# Patient Record
Sex: Male | Born: 1965 | Race: White | Hispanic: No | Marital: Single | Smoking: Never smoker
Health system: Southern US, Community
[De-identification: ages and names within clinical notes are randomized; demographics above are authoritative.]

## PROBLEM LIST (undated history)

## (undated) DIAGNOSIS — K7460 Unspecified cirrhosis of liver: Secondary | ICD-10-CM

## (undated) DIAGNOSIS — J45909 Unspecified asthma, uncomplicated: Secondary | ICD-10-CM

## (undated) DIAGNOSIS — I10 Essential (primary) hypertension: Secondary | ICD-10-CM

## (undated) DIAGNOSIS — E7800 Pure hypercholesterolemia, unspecified: Secondary | ICD-10-CM

## (undated) DIAGNOSIS — J309 Allergic rhinitis, unspecified: Secondary | ICD-10-CM

## (undated) DIAGNOSIS — Z9981 Dependence on supplemental oxygen: Secondary | ICD-10-CM

## (undated) HISTORY — PX: SKIN CANCER EXCISION: SHX779

## (undated) HISTORY — DX: Essential (primary) hypertension: I10

## (undated) HISTORY — DX: Pure hypercholesterolemia, unspecified: E78.00

## (undated) HISTORY — PX: CARPAL TUNNEL RELEASE: SHX101

## (undated) HISTORY — DX: Unspecified cirrhosis of liver: K74.60

## (undated) HISTORY — DX: Hereditary hemochromatosis: E83.110

## (undated) HISTORY — DX: Allergic rhinitis, unspecified: J30.9

## (undated) HISTORY — DX: Unspecified asthma, uncomplicated: J45.909

---

## 2012-07-15 HISTORY — PX: CHOLECYSTECTOMY: SHX55

## 2014-07-24 ENCOUNTER — Other Ambulatory Visit: Payer: Self-pay | Admitting: Oncology

## 2014-07-24 DIAGNOSIS — K7460 Unspecified cirrhosis of liver: Secondary | ICD-10-CM

## 2014-07-24 DIAGNOSIS — R160 Hepatomegaly, not elsewhere classified: Secondary | ICD-10-CM

## 2014-08-16 ENCOUNTER — Other Ambulatory Visit: Payer: Self-pay

## 2014-11-15 ENCOUNTER — Institutional Professional Consult (permissible substitution): Payer: Self-pay | Admitting: Pulmonary Disease

## 2014-12-28 ENCOUNTER — Ambulatory Visit (INDEPENDENT_AMBULATORY_CARE_PROVIDER_SITE_OTHER): Payer: Self-pay | Admitting: Pulmonary Disease

## 2014-12-28 ENCOUNTER — Encounter: Payer: Self-pay | Admitting: Pulmonary Disease

## 2014-12-28 ENCOUNTER — Other Ambulatory Visit (INDEPENDENT_AMBULATORY_CARE_PROVIDER_SITE_OTHER): Payer: Self-pay

## 2014-12-28 VITALS — BP 136/76 | HR 71 | Ht 70.0 in | Wt 179.0 lb

## 2014-12-28 DIAGNOSIS — K7460 Unspecified cirrhosis of liver: Secondary | ICD-10-CM | POA: Insufficient documentation

## 2014-12-28 DIAGNOSIS — R0602 Shortness of breath: Secondary | ICD-10-CM

## 2014-12-28 DIAGNOSIS — J449 Chronic obstructive pulmonary disease, unspecified: Secondary | ICD-10-CM

## 2014-12-28 DIAGNOSIS — R0902 Hypoxemia: Secondary | ICD-10-CM

## 2014-12-28 DIAGNOSIS — J9611 Chronic respiratory failure with hypoxia: Secondary | ICD-10-CM | POA: Insufficient documentation

## 2014-12-28 DIAGNOSIS — K745 Biliary cirrhosis, unspecified: Secondary | ICD-10-CM

## 2014-12-28 LAB — CBC WITH DIFFERENTIAL/PLATELET
Basophils Absolute: 0 10*3/uL (ref 0.0–0.1)
Basophils Relative: 0.5 % (ref 0.0–3.0)
EOS PCT: 3.1 % (ref 0.0–5.0)
Eosinophils Absolute: 0.1 10*3/uL (ref 0.0–0.7)
HEMATOCRIT: 51.8 % (ref 39.0–52.0)
Hemoglobin: 17.8 g/dL — ABNORMAL HIGH (ref 13.0–17.0)
LYMPHS ABS: 0.9 10*3/uL (ref 0.7–4.0)
Lymphocytes Relative: 20.3 % (ref 12.0–46.0)
MCHC: 34.3 g/dL (ref 30.0–36.0)
Monocytes Absolute: 0.5 10*3/uL (ref 0.1–1.0)
Monocytes Relative: 10.9 % (ref 3.0–12.0)
Neutro Abs: 2.8 10*3/uL (ref 1.4–7.7)
Neutrophils Relative %: 65.2 % (ref 43.0–77.0)
Platelets: 61 10*3/uL — ABNORMAL LOW (ref 150.0–400.0)
RBC: 4.58 Mil/uL (ref 4.22–5.81)
RDW: 14.7 % (ref 11.5–15.5)
WBC: 4.3 10*3/uL (ref 4.0–10.5)

## 2014-12-28 NOTE — Assessment & Plan Note (Signed)
Roy Case has been told that he has asthma and COPD despite never smoking in the past or having respiratory problems as a child. He has had the fairly abrupt progression of worsening dyspnea and hypoxemic respiratory failure over the last 3 years. He says that he is not sure that he actually has COPD and I agree with him. He does note some symptomatic improvement with albuterol, but I believe that his degree of hypoxemia outweighs any abnormality seen on his CT chest from 2015 or on his exam today. So it's not entirely clear to me that he actually has COPD. I worry that it may be that his hypoxemia is actually from intrapulmonary shunting related to his cirrhosis or an intracardiac shunt.  Plan: Continue to treat as COPD for now Check alpha-1 antitrypsin phenotype considering the fact that he has cirrhosis Continue oxygen as prescribed Continue Breo Continue albuterol as needed [facility]: Pulmonary function testing, pulmonary results, discharge summary, echocardiogram Follow-up 4-6 weeks

## 2014-12-28 NOTE — Patient Instructions (Signed)
We will request records from [facility] to see the results of your lung function testing and your echocardiograms Keep using your oxygen as you're doing We will call you with the results of the bloodwork We will see you back in 4-6 weeks or sooner if needed

## 2014-12-28 NOTE — Assessment & Plan Note (Signed)
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 Apparently he had a biopsy to confirm this. He does not have a gastroenterologist.  Plan: Refer to Dr. Erick BlinksJay Pyrtle for evaluation of his cirrhosis

## 2014-12-28 NOTE — Progress Notes (Signed)
Subjective:    Patient ID: Roy Case, male    DOB: Feb 27, 1966, 49 y.o.   MRN: [id number]  HPI Chief Complaint  Patient presents with  . PULMONARY CONSULT    self referral. pt. on o2 3.5-4L. occ. SOB. no wheezing. occ. dry cough. no chest pain/tightness.    This is a pleasant 49 year old male who has hypoxemic respiratory failure of uncertain etiology who comes to my clinic today for establishing care for the same. He says that he never smoked, he had a normal childhood without respiratory illnesses and throughout most of his adult life he never had any respiratory problems. However, 3 years ago he started having increasing shortness of breath when climbing a flight of stairs. He said this is a bit unusual because previously he had been very active with running and taking long walks. His symptoms progressed and so he was started on oxygen when he was hospitalized for "COPD" in 2014 in [place]. After that he says that he has been experiencing increasing shortness of breath and increasing oxygen requirements. He has undergone pulmonary function testing and was told that he had COPD versus asthma. He has been treated with multiple bronchodilators which he says helped to some degree. Albuterol does help when he takes it. He says that shortness of breath occurs primarily on exertion but he says that he also (significant orthopnea. He says that chemicals, dust, smoke, and fumes will sometimes make his breathing worse. He never smoked cigarettes. 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 He said that he was exposed to battery acid at times and he says that there were times when lithium batteries were being destroyed and he would be exposed to some of the chemicals and fumes that came off from them. He previously saw [place] in [place] but he was unsatisfied so he came to us for a second opinion.   Past Medical History  Diagnosis Date  . Hypertension   .  Allergic rhinitis   . Asthma   . High cholesterol      Family History  Problem Relation Age of Onset  . Lung cancer Mother      Social History   Social History  . Marital Status: Unknown    Spouse Name: N/A  . Number of Children: N/A  . Years of Education: N/A   Occupational History  . Not on file.   Social History Main Topics  . Smoking status: Never Smoker   . Smokeless tobacco: Never Used  . Alcohol Use: No  . Drug Use: No  . Sexual Activity: Not Currently   Other Topics Concern  . Not on file   Social History Narrative  . No narrative on file     Allergies  Allergen Reactions  . Penicillins Anaphylaxis    Age 35  . Doxycycline Rash     No outpatient prescriptions prior to visit.   No facility-administered medications prior to visit.       Review of Systems  HENT: Positive for sinus pressure and voice change.   Eyes: Negative.   Respiratory: Positive for cough, shortness of breath and wheezing.   Cardiovascular: Positive for palpitations and leg swelling.  Gastrointestinal: Negative.   Genitourinary: Negative.   Musculoskeletal: Negative.   Skin: Negative.   Allergic/Immunologic: Negative.   Neurological: Positive for dizziness, weakness and headaches.  Hematological: Bruises/bleeds easily.  Psychiatric/Behavioral: Negative.        Objective:   Physical Exam  Filed Vitals:   12/28/14 1515  BP: 136/76  Pulse: 71  Height:  (1.778 m)  Weight: 179 lb (81.194 kg)  SpO2: 83%   RA; 92% 4L [place]  Gen: chronically ill appearing, no acute distress HENT: NCAT, OP clear, neck supple without masses Eyes: PERRL, EOMi Lymph: no cervical lymphadenopathy PULM: CTA B CV: RRR, no mgr, no JVD GI: BS+, soft, nontender, no hsm Derm: palmar erythema noted, no rash or skin breakdown MSK: normal bulk and tone Neuro: A&Ox4, CN II-XII intact, strength 5/5 in all 4 extremities Psyche: normal mood and affect  2015 CT chest images personally reviewed  showing no pulmonary parenchymal abnormality that I can see      Assessment & Plan:  COPD with hypoxia [facility]) Roy Case has been told that he has asthma and COPD despite never smoking in the past or having respiratory problems as a child. He has had the fairly abrupt progression of worsening dyspnea and hypoxemic respiratory failure over the last 3 years. He says that he is not sure that he actually has COPD and I agree with him. He does note some symptomatic improvement with albuterol, but I believe that his degree of hypoxemia outweighs any abnormality seen on his CT chest from 2015 or on his exam today. So it's not entirely clear to me that he actually has COPD. I worry that it may be that his hypoxemia is actually from intrapulmonary shunting related to his cirrhosis or an intracardiac shunt.  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 Apparently he had a biopsy to confirm this. He does not have a gastroenterologist.  Plan: Refer to Dr. Erick Case for evaluation of his cirrhosis     Current outpatient prescriptions:  .  albuterol (PROVENTIL HFA;VENTOLIN HFA) 108 (90 BASE) MCG/ACT inhaler, Inhale 1 puff into the lungs every 6 (six) hours as needed for wheezing or shortness of breath., Disp: , Rfl:  .  ALPRAZolam (XANAX) 0.5 MG tablet, Take 0.5 mg by mouth at bedtime as needed for anxiety., Disp: , Rfl:  .  Fluticasone Furoate-Vilanterol (BREO ELLIPTA) 100-25 MCG/INH AEPB, Inhale into the lungs., Disp: , Rfl:  .  propranolol (INDERAL) 40 MG tablet, Take 40 mg by mouth 2 (two) times  daily., Disp: , Rfl:  .  sertraline (ZOLOFT) 25 MG tablet, Take 25 mg by mouth daily., Disp: , Rfl:

## 2015-01-02 LAB — ALPHA-1 ANTITRYPSIN PHENOTYPE: A-1 Antitrypsin: 149 mg/dL (ref 83–199)

## 2015-01-25 ENCOUNTER — Encounter: Payer: Self-pay | Admitting: Pulmonary Disease

## 2015-01-25 ENCOUNTER — Ambulatory Visit (INDEPENDENT_AMBULATORY_CARE_PROVIDER_SITE_OTHER): Payer: Self-pay | Admitting: Pulmonary Disease

## 2015-01-25 VITALS — BP 128/76 | HR 68 | Ht 70.0 in | Wt 177.0 lb

## 2015-01-25 DIAGNOSIS — R0902 Hypoxemia: Secondary | ICD-10-CM

## 2015-01-25 DIAGNOSIS — J019 Acute sinusitis, unspecified: Secondary | ICD-10-CM | POA: Insufficient documentation

## 2015-01-25 DIAGNOSIS — J449 Chronic obstructive pulmonary disease, unspecified: Secondary | ICD-10-CM

## 2015-01-25 DIAGNOSIS — J0120 Acute ethmoidal sinusitis, unspecified: Secondary | ICD-10-CM

## 2015-01-25 DIAGNOSIS — K745 Biliary cirrhosis, unspecified: Secondary | ICD-10-CM

## 2015-01-25 MED ORDER — AZITHROMYCIN 250 MG PO TABS: ORAL_TABLET | ORAL | Status: DC

## 2015-01-25 NOTE — Addendum Note (Signed)
Addended by: Nicanor AlconNAGLE, Rya Rausch K on: 01/25/2015 12:14 PM   Modules accepted: Orders

## 2015-01-25 NOTE — Progress Notes (Signed)
Subjective:    Patient ID: Roy Case, male    DOB: 1965-11-03, 49 y.o.   MRN: [id number]   Synopsis: Roy Case was referred for hypoxemic respiratory failure in 2016. He has been labeled as having COPD despite having a normal lung exam and never smoking cigarettes. As of November 2016 records we have requested from his primary care physician and his pulmonologist is still not been sent. Apparently he has had an extensive workup with PFTs and lung imaging in the past.  HPI  Chief Complaint  Patient presents with  . Follow-up    c/o sinus congestion, PND, yellow mucus X1 week.  denies any chest pain.     Roy Case has been struggling with sinus problems lately.  He says that he has a lot of pressure behind his eyes.  He has lots of green to yellow sinus congestion, coughing up more mucus.  Green to yellow sputum production. Breathing has been about the same during this time.  He has not seen Dr. Rhea BeltonPyrtle yet, plans to see him in January 2017.   Past Medical History  Diagnosis Date  . Hypertension   . Allergic rhinitis   . Asthma   . High cholesterol        Review of Systems     Objective:   Physical Exam Filed Vitals:   01/25/15 1134  BP: 128/76  Pulse: 68  Height: 5\' 10"  (1.778 m)  Weight: 177 lb (80.287 kg)  SpO2: 90%   4L [place]  Gen: chronically ill appearing HENT: OP clear, TM's clear, neck supple PULM: CTA B, normal percussion CV: RRR, no mgr, trace edema GI: BS+, soft, nontender Derm: mild cyanosis, no rash Psyche: normal mood and affect        Assessment & Plan:  COPD with hypoxia (HCC) This has been a stable interval but I still question the diagnosis of COPD. Unfortunately despite requesting records from his prior pulmonologist we still don't have them at this time. I still question a diagnosis of COPD considering the fact that he never smoked cigarettes. I do worry that there is some sort of interpulmonary intraparenchymal shunting. I'm  reluctant to order more testing because of his insurance status right now.  His recent alpha-1 testing was normal.  Plan: Continue oxygen as prescribed Continue Breo for now Follow-up in 2 months after we have received records from [place]  Hepatic cirrhosis [facility](HCC) As stated previously he has a history of hepatic cirrhosis which apparently has been biopsied. Again, I don't have any records of this. I would like for him to establish care with Dr. Rhea BeltonPyrtle after he has insurance.  Acute sinus infection He has had sinus pressure and pain for the last 2 weeks with purulent mucus production. Plan: Z-Pak     Current outpatient prescriptions:  .  albuterol (PROVENTIL HFA;VENTOLIN HFA) 108 (90 BASE) MCG/ACT inhaler, Inhale 1 puff into the lungs every 6 (six) hours as needed for wheezing or shortness of breath., Disp: , Rfl:  .  albuterol (PROVENTIL) (2.5 MG/3ML) 0.083% nebulizer solution, Take 2.5 mg by nebulization every 6 (six) hours as needed for wheezing or shortness of breath., Disp: , Rfl:  .  ALPRAZolam (XANAX) 0.5 MG tablet, Take 0.5 mg by mouth at bedtime as needed for anxiety., Disp: , Rfl:  .  Fluticasone Furoate-Vilanterol (BREO ELLIPTA) 100-25 MCG/INH AEPB, Inhale into the lungs., Disp: , Rfl:  .  propranolol (INDERAL) 40 MG tablet, Take 40 mg by mouth 2 (two)  times daily., Disp: , Rfl:  .  sertraline (ZOLOFT) 25 MG tablet, Take 25 mg by mouth daily., Disp: , Rfl:

## 2015-01-25 NOTE — Assessment & Plan Note (Signed)
He has had sinus pressure and pain for the last 2 weeks with purulent mucus production. Plan: Z-Pak

## 2015-01-25 NOTE — Assessment & Plan Note (Signed)
This has been a stable interval but I still question the diagnosis of COPD. Unfortunately despite requesting records from his prior pulmonologist we still don't have them at this time. I still question a diagnosis of COPD considering the fact that he never smoked cigarettes. I do worry that there is some sort of interpulmonary intraparenchymal shunting. I'm reluctant to order more testing because of his insurance status right now.  His recent alpha-1 testing was normal.  Plan: Continue oxygen as prescribed Continue Breo for now Follow-up in 2 months after we have received records from [place]

## 2015-01-25 NOTE — Assessment & Plan Note (Signed)
As stated previously he has a history of hepatic cirrhosis which apparently has been biopsied. Again, I don't have any records of this. I would like for him to establish care with Dr. Rhea BeltonPyrtle after he has insurance.

## 2015-01-29 ENCOUNTER — Encounter: Payer: Self-pay | Admitting: Internal Medicine

## 2015-02-02 ENCOUNTER — Telehealth: Payer: Self-pay | Admitting: Pulmonary Disease

## 2015-02-02 NOTE — Telephone Encounter (Signed)
Patient was seen in ER last night.  He is feeling a little better today, he was given Tylenol with Codeine, steroid shot.   [facility]. Patient was seen by Dr. Kendrick FriesMcQuaid on 11/10, pt says the hospital asked him to call our office to make Dr. Kendrick FriesMcQuaid aware. Patient wants to know if Dr. Kendrick FriesMcQuaid needs to see him again.  Dr. Kendrick FriesMcQuaid, please advise.

## 2015-02-05 NOTE — Telephone Encounter (Signed)
Make sure he has an appointment within the next month please

## 2015-02-05 NOTE — Telephone Encounter (Signed)
Spoke with pt, scheduled with TP for 11/30.  Nothing further needed at this time.  Forwarding to BQ just as FYI.

## 2015-02-14 ENCOUNTER — Ambulatory Visit: Payer: Self-pay | Admitting: Adult Health

## 2015-02-23 ENCOUNTER — Telehealth: Payer: Self-pay | Admitting: Pulmonary Disease

## 2015-02-23 MED ORDER — ALBUTEROL SULFATE HFA 108 (90 BASE) MCG/ACT IN AERS: 1.0000 | INHALATION_SPRAY | RESPIRATORY_TRACT | Status: DC | PRN

## 2015-02-23 NOTE — Telephone Encounter (Signed)
Pt requesting refill on proair.  This has been sent.  Nothing further needed.

## 2015-04-02 ENCOUNTER — Encounter: Payer: Self-pay | Admitting: Pulmonary Disease

## 2015-04-02 ENCOUNTER — Ambulatory Visit (INDEPENDENT_AMBULATORY_CARE_PROVIDER_SITE_OTHER): Payer: BLUE CROSS/BLUE SHIELD | Admitting: Pulmonary Disease

## 2015-04-02 VITALS — BP 124/72 | HR 62 | Ht 70.0 in | Wt 175.0 lb

## 2015-04-02 DIAGNOSIS — K745 Biliary cirrhosis, unspecified: Secondary | ICD-10-CM | POA: Diagnosis not present

## 2015-04-02 DIAGNOSIS — J9611 Chronic respiratory failure with hypoxia: Secondary | ICD-10-CM

## 2015-04-02 NOTE — Assessment & Plan Note (Addendum)
Mr. Roy Case has unexplained chronic hypoxemic respiratory failure. He has previously been labeled as having COPD but this is incorrect because the simple spirometry results available to me showed no evidence of airflow obstruction. I have personally reviewed the images from his July 2015 CT chest and I really cannot appreciate much pulmonary parenchymal disease, though the study was limited by motion artifact. There was no evidence of a pulmonary embolism. He does have a chronic cough with some mucus production so at best he has chronic bronchitis but I'm not sure that it severe enough to explain his hypoxemia.  The only fact of which I'm certain is that he has cirrhosis, so this raises the question for right to left shunting. His echocardiogram in February 2016 did not show evidence of pulmonary hypertension, but sometimes these can be falsely negative.  So at this point the differential diagnosis of his chronic respiratory failure with hypoxemia for me is chronic right to left shunting secondary to cirrhosis versus portal pulmonary hypertension versus much less likely chronic bronchitis which did not manifest itself as airflow obstruction in his most recent simple spirometry testing.  Plan: Continue Breo for now Apply for portable oxygen concentrator See GI (Dr. Rhea BeltonPyrtle) for cirrhosis workup Full pulmonary function testing to evaluate for restrictive lung disease and obtained DLCO Depending on results of pulmonary function testing will either order an echocardiogram with shunt (bubble) study or a high resolution CT chest, less likely right heart catheterization Follow-up 6-8 weeks

## 2015-04-02 NOTE — Assessment & Plan Note (Signed)
He has an appointment with Dr. Rhea BeltonPyrtle this week to evaluate his cirrhosis. As above, I am concerned that there may be some right to left shunting which can be seen with cirrhosis. Of note, there was a hypodense lesion seen in the left hepatic lobe on his July 2015 CT chest. Because he has apparently had a biopsy of his liver I'm going to request those results of that they are available to Dr. Rhea BeltonPyrtle at the time of his consultation this week.

## 2015-04-02 NOTE — Progress Notes (Signed)
Subjective:    Patient ID: Roy Case, male    DOB: Jul 28, 1965, 50 y.o.   MRN: [id number]   Synopsis: Roy Case was referred for hypoxemic respiratory failure in 2016. He has been labeled as having COPD despite having a normal lung exam and never smoking cigarettes.   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 Nodular, cirrhotic liver with a 1.5 cm mildly hyperdense mass in the left hepatic lobe. July 2015 simple spirometry: Ratio 74%, FEV1 2.53 L (60% predicted), FVC 3.43 L (64% predicted) February 2016 echocardiogram: LVEF 65-70%, right ventricle is normal in size and function, right atrial normal in size and function. Mild TR, normal PA pressure estimate  HPI  Chief Complaint  Patient presents with  . Follow-up    Pt has good and bad days, about the same number of each; bad days include nonprod cough, increased wheezing, SOB, chest tightness.    Roy Case is lined up to see Dr. Rhea BeltonPyrtle later this week to evaluate his cirrhosis. He tells me that this is been a stable interval for him. There is been no significant change in his shortness of breath over the last several weeks. [facility] As above, he continues to have some wheezing, cough and chest tightness. He's had no significant flareups in the last several weeks. He continues to use and benefit from his oxygen on a regular basis.  Past Medical History  Diagnosis Date  . Hypertension   . Allergic rhinitis   . Asthma   . High cholesterol        Review of Systems  Constitutional: Negative for fever, chills and fatigue.  HENT: Negative for postnasal drip, sinus pressure and sneezing.   Respiratory: Positive for cough, shortness of breath and wheezing.   Cardiovascular: Positive for leg swelling. Negative for chest pain and palpitations.      Objective:   Physical Exam Filed Vitals:   04/02/15 0907  BP: 124/72  Pulse: 62  Height: 5\' 10"  (1.778 m)  Weight: 79.379 kg (175 lb)  SpO2: 89%   4L [place]  Gen: chronically ill appearing HENT: OP clear, TM's clear, neck supple PULM: CTA B, normal percussion CV: RRR, no mgr, trace ankle edema GI: BS+, soft, nontender Derm: mild diffuse cyanosis, no rash Psyche: normal mood and affect  Multiple studies reviewed today, summarized above; I personally reviewed the images from his CT chest from 09/2013     Assessment & Plan:  Chronic respiratory failure with hypoxia [facility](HCC) Roy Case has unexplained chronic hypoxemic respiratory failure. He has previously been labeled as having COPD but this is incorrect because the simple spirometry results available to me showed no evidence of airflow obstruction. I have personally reviewed the images from his July 2015 CT chest and I really cannot appreciate much pulmonary parenchymal disease, though the study was limited by motion artifact. There was no evidence of a pulmonary embolism. He does have a chronic cough with some mucus production so at best he has chronic bronchitis but I'm not sure that it severe enough to explain his hypoxemia.  The only fact of which I'm certain is that he has cirrhosis, so this raises the question for right to left shunting. His echocardiogram in February 2016 did not show evidence of pulmonary hypertension, but sometimes these can be falsely negative.  So at this point the differential  diagnosis of his chronic respiratory failure with hypoxemia for me is chronic right to left shunting secondary to cirrhosis versus portal pulmonary hypertension versus much less likely chronic bronchitis which did not manifest itself as airflow obstruction in his most recent simple spirometry testing.  Plan: Continue Breo for now Apply for portable oxygen concentrator See GI (Dr. Rhea Belton) for cirrhosis workup Full pulmonary function  testing to evaluate for restrictive lung disease and obtained DLCO Depending on results of pulmonary function testing will either order an echocardiogram with shunt (bubble) study or a high resolution CT chest, less likely right heart catheterization Follow-up 6-8 weeks  Hepatic cirrhosis (HCC) He has an appointment with Dr. Rhea Belton this week to evaluate his cirrhosis. As above, I am concerned that there may be some right to left shunting which can be seen with cirrhosis. Of note, there was a hypodense lesion seen in the left hepatic lobe on his July 2015 CT chest. Because he has apparently had a biopsy of his liver I'm going to request those results of that they are available to Dr. Rhea Belton at the time of his consultation this week.  > 50% of time spent face to face in a 32 minute visit   Current outpatient prescriptions:  .  albuterol (PROVENTIL HFA;VENTOLIN HFA) 108 (90 BASE) MCG/ACT inhaler, Inhale 1 puff into the lungs every 4 (four) hours as needed for wheezing or shortness of breath., Disp: 18 g, Rfl: 2 .  albuterol (PROVENTIL) (2.5 MG/3ML) 0.083% nebulizer solution, Take 2.5 mg by nebulization every 6 (six) hours as needed for wheezing or shortness of breath., Disp: , Rfl:  .  ALPRAZolam (XANAX) 0.5 MG tablet, Take 0.5 mg by mouth at bedtime as needed for anxiety., Disp: , Rfl:  .  Fluticasone Furoate-Vilanterol (BREO ELLIPTA) 100-25 MCG/INH AEPB, Inhale into the lungs., Disp: , Rfl:  .  propranolol (INDERAL) 40 MG tablet, Take 40 mg by mouth 2 (two) times daily., Disp: , Rfl:  .  sertraline (ZOLOFT) 25 MG tablet, Take 25 mg by mouth daily., Disp: , Rfl:

## 2015-04-02 NOTE — Patient Instructions (Signed)
We are going to order a full PFT on Wednesday of this week. Once that result is back we will let you know the next steps for getting to the bottom of you illness. We will see you back in 6-8 weeks.

## 2015-04-04 ENCOUNTER — Other Ambulatory Visit (INDEPENDENT_AMBULATORY_CARE_PROVIDER_SITE_OTHER): Payer: BLUE CROSS/BLUE SHIELD

## 2015-04-04 ENCOUNTER — Encounter: Payer: Self-pay | Admitting: Internal Medicine

## 2015-04-04 ENCOUNTER — Ambulatory Visit (INDEPENDENT_AMBULATORY_CARE_PROVIDER_SITE_OTHER): Payer: BLUE CROSS/BLUE SHIELD | Admitting: Internal Medicine

## 2015-04-04 ENCOUNTER — Ambulatory Visit (HOSPITAL_COMMUNITY)
Admission: RE | Admit: 2015-04-04 | Discharge: 2015-04-04 | Disposition: A | Discharge status: Home / Self Care | Payer: BLUE CROSS/BLUE SHIELD | Source: Ambulatory Visit | Attending: Pulmonary Disease | Admitting: Pulmonary Disease

## 2015-04-04 VITALS — BP 128/72 | HR 76 | Ht 70.0 in | Wt 173.6 lb

## 2015-04-04 DIAGNOSIS — J9611 Chronic respiratory failure with hypoxia: Secondary | ICD-10-CM | POA: Insufficient documentation

## 2015-04-04 DIAGNOSIS — Z0189 Encounter for other specified special examinations: Secondary | ICD-10-CM | POA: Diagnosis not present

## 2015-04-04 DIAGNOSIS — K7460 Unspecified cirrhosis of liver: Secondary | ICD-10-CM

## 2015-04-04 DIAGNOSIS — D696 Thrombocytopenia, unspecified: Secondary | ICD-10-CM | POA: Diagnosis not present

## 2015-04-04 DIAGNOSIS — K766 Portal hypertension: Secondary | ICD-10-CM | POA: Diagnosis not present

## 2015-04-04 DIAGNOSIS — D7589 Other specified diseases of blood and blood-forming organs: Secondary | ICD-10-CM | POA: Diagnosis not present

## 2015-04-04 LAB — PULMONARY FUNCTION TEST
DL/VA % PRED: 38 %
DL/VA: 1.77 ml/min/mmHg/L
DLCO unc % pred: 30 %
DLCO unc: 9.4 ml/min/mmHg
FEF 25-75 POST: 2.64 L/s
FEF 25-75 Pre: 1.67 L/sec
FEF2575-%CHANGE-POST: 58 %
FEF2575-%PRED-POST: 76 %
FEF2575-%PRED-PRE: 48 %
FEV1-%Change-Post: 14 %
FEV1-%Pred-Post: 74 %
FEV1-%Pred-Pre: 65 %
FEV1-PRE: 2.5 L
FEV1-Post: 2.87 L
FEV1FVC-%CHANGE-POST: 11 %
FEV1FVC-%PRED-PRE: 88 %
FEV6-%Change-Post: 3 %
FEV6-%PRED-PRE: 75 %
FEV6-%Pred-Post: 78 %
FEV6-Post: 3.73 L
FEV6-Pre: 3.61 L
FEV6FVC-%Change-Post: 0 %
FEV6FVC-%Pred-Post: 103 %
FEV6FVC-%Pred-Pre: 102 %
FVC-%CHANGE-POST: 2 %
FVC-%PRED-PRE: 73 %
FVC-%Pred-Post: 75 %
FVC-POST: 3.73 L
FVC-PRE: 3.63 L
POST FEV1/FVC RATIO: 77 %
POST FEV6/FVC RATIO: 100 %
PRE FEV1/FVC RATIO: 69 %
Pre FEV6/FVC Ratio: 100 %
RV % pred: 108 %
RV: 2.12 L
TLC % pred: 84 %
TLC: 5.74 L

## 2015-04-04 LAB — COMPREHENSIVE METABOLIC PANEL
ALT: 36 U/L (ref 0–53)
AST: 57 U/L — ABNORMAL HIGH (ref 0–37)
Albumin: 3 g/dL — ABNORMAL LOW (ref 3.5–5.2)
Alkaline Phosphatase: 83 U/L (ref 39–117)
BILIRUBIN TOTAL: 2.7 mg/dL — AB (ref 0.2–1.2)
BUN: 9 mg/dL (ref 6–23)
CALCIUM: 8.5 mg/dL (ref 8.4–10.5)
CO2: 23 meq/L (ref 19–32)
Chloride: 110 mEq/L (ref 96–112)
Creatinine, Ser: 0.65 mg/dL (ref 0.40–1.50)
GFR: 139 mL/min (ref >60.00)
Glucose, Bld: 76 mg/dL (ref 70–99)
Potassium: 3.8 mEq/L (ref 3.5–5.1)
Sodium: 141 mEq/L (ref 135–145)
Total Protein: 6.6 g/dL (ref 6.0–8.3)

## 2015-04-04 LAB — IBC PANEL
IRON: 189 ug/dL — AB (ref 42–165)
Saturation Ratios: 87.7 % — ABNORMAL HIGH (ref 20.0–50.0)
Transferrin: 154 mg/dL — ABNORMAL LOW (ref 212.0–360.0)

## 2015-04-04 LAB — CBC WITH DIFFERENTIAL/PLATELET
BASOS PCT: 0.4 % (ref 0.0–3.0)
Basophils Absolute: 0 10*3/uL (ref 0.0–0.1)
EOS PCT: 4.8 % (ref 0.0–5.0)
Eosinophils Absolute: 0.2 10*3/uL (ref 0.0–0.7)
HEMATOCRIT: 51.4 % (ref 39.0–52.0)
HEMOGLOBIN: 17.6 g/dL — AB (ref 13.0–17.0)
Lymphocytes Relative: 19.4 % (ref 12.0–46.0)
Lymphs Abs: 0.8 10*3/uL (ref 0.7–4.0)
MCHC: 34.2 g/dL (ref 30.0–36.0)
MCV: 111.5 fl — ABNORMAL HIGH (ref 78.0–100.0)
MONO ABS: 0.5 10*3/uL (ref 0.1–1.0)
MONOS PCT: 13 % — AB (ref 3.0–12.0)
NEUTROS PCT: 62.4 % (ref 43.0–77.0)
Neutro Abs: 2.6 10*3/uL (ref 1.4–7.7)
Platelets: 67 10*3/uL — ABNORMAL LOW (ref 150.0–400.0)
RBC: 4.61 Mil/uL (ref 4.22–5.81)
RDW: 15 % (ref 11.5–15.5)
WBC: 4.1 10*3/uL (ref 4.0–10.5)

## 2015-04-04 LAB — FOLATE: Folate: >23.6 ng/mL (ref >5.9)

## 2015-04-04 LAB — PROTIME-INR
INR: 1.8 ratio — ABNORMAL HIGH (ref 0.8–1.0)
PROTHROMBIN TIME: 18.9 s — AB (ref 9.6–13.1)

## 2015-04-04 LAB — FERRITIN: Ferritin: 493.1 ng/mL — ABNORMAL HIGH (ref 22.0–322.0)

## 2015-04-04 LAB — VITAMIN B12: Vitamin B-12: 663 pg/mL (ref 211–911)

## 2015-04-04 MED ORDER — ALBUTEROL SULFATE (2.5 MG/3ML) 0.083% IN NEBU
2.5000 mg | INHALATION_SOLUTION | Freq: Once | RESPIRATORY_TRACT | Status: AC
  Administered 2015-04-04: 2.5 mg via RESPIRATORY_TRACT

## 2015-04-04 NOTE — Patient Instructions (Addendum)
You have been scheduled for an endoscopy. Please follow written instructions given to you at your visit today. If you use inhalers (even only as needed), please bring them with you on the day of your procedure. Your physician has requested that you go to www.startemmi.com and enter the access code given to you at your visit today. This web site gives a general overview about your procedure. However, you should still follow specific instructions given to you by our office regarding your preparation for the procedure.  Your physician has requested that you go to the basement for the following lab work before leaving today: CBC, CMP, INR, hemochromatosis gene analysis, IBC, Ferritin  You have been scheduled to see Terri Piedra, MD at [place] on 05/03/15 @ 9:00 am. Please arrive at 8:45 am for registration. Bring medications, insurance cards and copay if you have one.  You have been scheduled for a CT scan of the abdomen and pelvis at [place] ([address]---this is in the same building as Press [facility]).   You are scheduled on Friday at 8:30 am. You should arrive 15 minutes prior to your appointment time for registration. Please follow the written instructions below on the day of your exam:  WARNING: IF YOU ARE ALLERGIC TO IODINE/X-RAY DYE, PLEASE NOTIFY RADIOLOGY IMMEDIATELY AT [phone]! YOU WILL BE GIVEN A 13 HOUR PREMEDICATION PREP.  1) Do not eat or drink anything after 4:30 am (4 hours prior to your test) 2) You have been given 1 bottle of oral contrast to drink. The solution may taste better if refrigerated, but do NOT add ice or any other liquid to this solution. Shake well before drinking.    Drink 1 bottle of contrast @ 7:30 am (1 hour prior to your exam)  You may take any medications as prescribed with a small amount of water except for the following: Metformin, Glucophage, Glucovance, Avandamet, Riomet, Fortamet, Actoplus Met, Janumet, Glumetza  or Metaglip. The above medications must be held the day of the exam AND 48 hours after the exam.  The purpose of you drinking the oral contrast is to aid in the visualization of your intestinal tract. The contrast solution may cause some diarrhea. Before your exam is started, you will be given a small amount of fluid to drink. Depending on your individual set of symptoms, you may also receive an intravenous injection of x-ray contrast/dye. Plan on being at [facility] for 30 minutes or longer, depending on the type of exam you are having performed.  This test typically takes 30-45 minutes to complete.  If you have any questions regarding your exam or if you need to reschedule, you may call the CT department at [phone] between the hours of 8:00 am and 5:00 pm, Monday-Friday.  ________________________________________________________________________

## 2015-04-04 NOTE — Progress Notes (Signed)
Patient ID: Roy Case, male   DOB: March 24, 1965, 50 y.o.   MRN: [id number] HPI: Roy Case is a 50 year old male with past medical history of cryptogenic cirrhosis, oxygen-dependent lung disease of unclear etiology, hypertension and hyperlipidemia who seen in consultation at the request of Dr. Kendrick Case and Dr. Michel Santee Case to establish care given history of cirrhosis. He is here alone today. He reports he was diagnosed with cirrhosis at the time of cholecystectomy for cholecystitis. Liver was reportedly biopsied at this time and cirrhosis was confirmed. He reports he has a history of obesity and at the time of gallbladder surgery was 235 pounds but he has lost 50-60 pounds due to lifestyle modification since surgery. He denies a history of jaundice, GI bleeding, confusion, ascites. He has very mild intermittent edema at the ankle on occasion. He denies abdominal pain. Denies nausea or vomiting. Reports good appetite without dysphagia or odynophagia. Bowel movements are regular without blood in his stool or melena. He denies a family history of liver disease or colorectal cancer. He does not currently drink alcohol and denies alcohol abuse history.  He was seen by Dr. Jennye Case after diagnosis but does not recall prior upper endoscopy. Per these records initial workup was negative for viral hepatitis and he received hepatitis A and B vaccination. Negative alpha-1 genetic evaluation, negative ANA, negative anti-smooth muscle antibody, normal ceruloplasmin, normal ferritin but iron saturation was elevated at 75%. Serum iron was normal. He's had prior CT of the abdomen in April 2014 which showed hepatic cirrhosis with splenomegaly, liver cyst in the right lobe  Review of records also reveals history of thrombocytopenia  He uses propranolol for "blood pressure".  Past Medical History  Diagnosis Date  . Hypertension   . Allergic rhinitis   . Asthma   . High cholesterol   . Cirrhosis [facility])      Past Surgical History  Procedure Laterality Date  . Cholecystectomy  may 2014  . Skin cancer excision Left     arm  . Carpal tunnel release Right     Outpatient Prescriptions Prior to Visit  Medication Sig Dispense Refill  . albuterol (PROVENTIL HFA;VENTOLIN HFA) 108 (90 BASE) MCG/ACT inhaler Inhale 1 puff into the lungs every 4 (four) hours as needed for wheezing or shortness of breath. 18 g 2  . albuterol (PROVENTIL) (2.5 MG/3ML) 0.083% nebulizer solution Take 2.5 mg by nebulization every 6 (six) hours as needed for wheezing or shortness of breath.    . ALPRAZolam (XANAX) 0.5 MG tablet Take 0.5 mg by mouth at bedtime as needed for anxiety.    . Fluticasone Furoate-Vilanterol (BREO ELLIPTA) 100-25 MCG/INH AEPB Inhale into the lungs.    . propranolol (INDERAL) 40 MG tablet Take 40 mg by mouth 2 (two) times daily.    . sertraline (ZOLOFT) 25 MG tablet Take 25 mg by mouth daily.     No facility-administered medications prior to visit.    Allergies  Allergen Reactions  . Penicillins Anaphylaxis    Age 50  . Doxycycline Rash    Family History  Problem Relation Age of Onset  . Lung cancer Mother   . Colon cancer Neg Hx   . Esophageal cancer Neg Hx   . Pancreatic cancer Neg Hx   . Kidney disease Neg Hx   . Liver disease Neg Hx     Social History  Substance Use Topics  . Smoking status: Never Smoker   . Smokeless tobacco: Never Used  . Alcohol Use: No  ROS: As per history of present illness, otherwise negative  BP 128/72 mmHg  Pulse 76  Ht  (1.778 m)  Wt 173 lb 9.6 oz (78.744 kg)  BMI 24.91 kg/m2 Constitutional: Well-developed and well-nourished. No distress. HEENT: Normocephalic and atraumatic. Oropharynx is clear and moist. No oropharyngeal exudate. Conjunctivae are normal.  No scleral icterus. Poor dentition Neck: Neck supple. Trachea midline. Cardiovascular: Normal rate, regular rhythm and intact distal pulses. No M/R/G Pulmonary/chest: Effort normal  and breath sounds normal. No wheezing, rales or rhonchi. Abdominal: Soft, nontender, nondistended. Bowel sounds active throughout. Spleen is palpable below the left costal margin Extremities: no clubbing, cyanosis, or edema Lymphadenopathy: No cervical adenopathy noted. Neurological: Alert and oriented to person place and time. No asterixis Skin: Skin is warm and dry. No rashes noted. No palmar erythema or spider angioma seen Psychiatric: Normal mood and affect. Behavior is normal.  RELEVANT LABS AND IMAGING: CBC    Component Value Date/Time   WBC 4.3 12/28/2014 1618   RBC 4.58 12/28/2014 1618   HGB 17.8* 12/28/2014 1618   HCT 51.8 12/28/2014 1618   PLT 61.0 Repeated and verified X2.* 12/28/2014 1618   MCV 113.1 Repeated and verified X2.* 12/28/2014 1618   MCHC 34.3 12/28/2014 1618   RDW 14.7 12/28/2014 1618   LYMPHSABS 0.9 12/28/2014 1618   MONOABS 0.5 12/28/2014 1618   EOSABS 0.1 12/28/2014 1618   BASOSABS 0.0 12/28/2014 1618   Labs from April 2016 reviewed -- INR 1.4 AST 71, ALT 55, alkaline phosphatase 81, total bili 2.4  ASSESSMENT/PLAN: 50 year old male with past medical history of cryptogenic cirrhosis, oxygen-dependent lung disease of unclear etiology, hypertension and hyperlipidemia who seen in consultation at the request of Dr. Kendrick Case and Dr. Michel Santee Case to establish care given history of cirrhosis.  1. Cryptogenic cirrhosis with portal hypertension -- cirrhosis etiology unclear but most likely nonalcoholic fatty liver disease. Would like to obtain biopsy and all records from [facility] if possible. We had a long discussion today regarding cirrhosis, portal hypertension and the complications and management going forward. I'm concerned that he has significant portal hypertension given thrombocytopenia and suggestion of varices by cross-sectional imaging. His INR has also been elevated and his bili was up slightly earlier in 2016. Repeat labs today to include CBC, CMP and INR.  I'm going to repeat an iron saturation and check hemochromatosis gene analysis, though this is felt unlikely given normal ferritin. He reports a history of a "liver spot" and he is overdue for [facility] screening. Given history of liver cyst I'm going to do an IV enhanced CT scan of the abdomen to evaluate and screen for [facility]. Subsequent screening will be performed with ultrasound every 6 months. I recommended low-sodium diet, complete alcohol avoidance. He is already on propranolol but needs variceal screening. Resting heart rate is 76 and so if varices are found propranolol will need to be titrated --Labs --HCC screening --Prior hepatitis A and B vaccination completed --EGD for variceal screening in the outpatient hospital setting given oxygen use. We discussed the risks, benefits and alternatives to upper endoscopy and he is agreeable to proceed --Six-month office follow-up --Low-sodium diet  2. Need for primary care -- primary care referral at the patient's request. He would like all of his providers in the same health system if possible.  3. Macrocytosis -- check B12 and folate   WG:NFAOZ Leonia Reader, Md [address] [place], [place] [address]  CC: Dr. Heber [place]

## 2015-04-04 NOTE — Addendum Note (Signed)
Addended by: Richardson Chiquito on: 04/04/2015 01:28 PM   Modules accepted: Orders

## 2015-04-06 ENCOUNTER — Encounter: Payer: Self-pay | Admitting: Internal Medicine

## 2015-04-06 ENCOUNTER — Ambulatory Visit (INDEPENDENT_AMBULATORY_CARE_PROVIDER_SITE_OTHER)
Admission: RE | Admit: 2015-04-06 | Discharge: 2015-04-06 | Disposition: A | Discharge status: Home / Self Care | Payer: BLUE CROSS/BLUE SHIELD | Source: Ambulatory Visit | Attending: Internal Medicine | Admitting: Internal Medicine

## 2015-04-06 DIAGNOSIS — K766 Portal hypertension: Secondary | ICD-10-CM | POA: Diagnosis not present

## 2015-04-06 DIAGNOSIS — K7460 Unspecified cirrhosis of liver: Secondary | ICD-10-CM

## 2015-04-06 DIAGNOSIS — D696 Thrombocytopenia, unspecified: Secondary | ICD-10-CM

## 2015-04-06 MED ORDER — IOHEXOL 300 MG/ML  SOLN
100.0000 mL | Freq: Once | INTRAMUSCULAR | Status: AC | PRN
  Administered 2015-04-06: 100 mL via INTRAVENOUS

## 2015-04-08 LAB — HEMOCHROMATOSIS DNA-PCR(C282Y,H63D)

## 2015-04-09 ENCOUNTER — Other Ambulatory Visit: Payer: Self-pay

## 2015-04-11 ENCOUNTER — Encounter: Payer: Self-pay | Admitting: Internal Medicine

## 2015-04-12 ENCOUNTER — Other Ambulatory Visit: Payer: Self-pay

## 2015-04-12 DIAGNOSIS — J9611 Chronic respiratory failure with hypoxia: Secondary | ICD-10-CM

## 2015-04-15 ENCOUNTER — Telehealth: Payer: Self-pay | Admitting: Gastroenterology

## 2015-04-15 NOTE — Telephone Encounter (Signed)
On call note. Pt relate lower abd and groin discomfort/pressure starting today. Had loose stools for past 2 days but no loose stool in past several hours when discomfort/pressure started. Took an Imodium several hours ago. No fevers, chills, N/V, bleeding, abd distention, dysuria. DC Imodium. Trial of Tylenol no more than 2 g per day and clear liquids today. Call back or seek treatment at an ED or urgent care if symptoms persist.

## 2015-04-16 ENCOUNTER — Telehealth: Payer: Self-pay | Admitting: Internal Medicine

## 2015-04-16 NOTE — Telephone Encounter (Signed)
Spoke with pt and he is aware. 

## 2015-04-16 NOTE — Telephone Encounter (Signed)
Left message for pt to call back.  Pt called and spoke with Dr. Russella Dar over the weekend regarding abdominal pain that goes from his waist down to his groin and around to his back. States that he started taking tylenol like Dr. Russella Dar told him to and the pain goes away. Pt states he is passing gas and able to go to the bathroom fine but want to know what Dr. Rhea Belton thinks this might be. Please advise.

## 2015-04-16 NOTE — Telephone Encounter (Signed)
I'm not sure what could be causing this pain. Has he noticed increasing abdominal girth? CT from 10 days ago did not show bowel pathology or evidence of kidney stones I pain persisting, I would see if he can be seen by APP and if not, by primary care

## 2015-04-18 ENCOUNTER — Other Ambulatory Visit (HOSPITAL_COMMUNITY): Payer: BLUE CROSS/BLUE SHIELD

## 2015-04-20 ENCOUNTER — Other Ambulatory Visit (HOSPITAL_COMMUNITY): Payer: BLUE CROSS/BLUE SHIELD

## 2015-04-23 ENCOUNTER — Other Ambulatory Visit: Payer: Self-pay

## 2015-04-23 ENCOUNTER — Ambulatory Visit (HOSPITAL_COMMUNITY): Payer: BLUE CROSS/BLUE SHIELD | Attending: Cardiovascular Disease

## 2015-04-23 DIAGNOSIS — R0600 Dyspnea, unspecified: Secondary | ICD-10-CM | POA: Diagnosis present

## 2015-04-23 DIAGNOSIS — J9611 Chronic respiratory failure with hypoxia: Secondary | ICD-10-CM | POA: Diagnosis not present

## 2015-04-23 DIAGNOSIS — R931 Abnormal findings on diagnostic imaging of heart and coronary circulation: Secondary | ICD-10-CM | POA: Diagnosis not present

## 2015-04-23 DIAGNOSIS — I517 Cardiomegaly: Secondary | ICD-10-CM | POA: Diagnosis not present

## 2015-04-23 DIAGNOSIS — E785 Hyperlipidemia, unspecified: Secondary | ICD-10-CM | POA: Insufficient documentation

## 2015-04-23 DIAGNOSIS — I10 Essential (primary) hypertension: Secondary | ICD-10-CM | POA: Diagnosis not present

## 2015-04-24 ENCOUNTER — Telehealth: Payer: Self-pay | Admitting: Pulmonary Disease

## 2015-04-24 DIAGNOSIS — J9611 Chronic respiratory failure with hypoxia: Secondary | ICD-10-CM

## 2015-04-24 NOTE — Addendum Note (Signed)
Addended by: Velvet Bathe on: 04/24/2015 05:35 PM   Modules accepted: Orders

## 2015-04-24 NOTE — Telephone Encounter (Addendum)
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 It was unclear from the echo if this was occurring at the atrial level or intrapulmonary. 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  However, I would like for him to have a right heart catheterization to help sort this out to see if there is any oxygen step up in his blood or if there is evidence of pulmonary hypertension not seen on his echocardiograms. During that procedure I would also like for him to have a hepatic vein wedge pressure measured because of his cirrhosis.  I will place a consult with Dr. Gala Romney and Dr. Sherlie Ban to consider this right heart catheterization.  Heber Drexel, MD [place] [facility] Pager: [phone] Cell: [phone] After 3pm or if no response, call [phone]

## 2015-04-24 NOTE — Telephone Encounter (Signed)
Heather/Megan,  Please arrange for this patient to see one of [place]. Thanks.

## 2015-04-24 NOTE — Telephone Encounter (Signed)
Called by Roy Case with c/o cough, pain in chest with deep breath, fever, weakness and diarrhea for 2 days. Roy Case has a complex medical history including cirrhosis and chronic hypoxia. I suspect that Roy Case may well be dehydrated as well at this time d/t his diarrhea. It is not possible to completely evaluate him over the telephone and advise him to go to the ED tonight to be fully evaluated.

## 2015-04-25 ENCOUNTER — Telehealth (HOSPITAL_COMMUNITY): Payer: Self-pay | Admitting: Vascular Surgery

## 2015-04-25 NOTE — Telephone Encounter (Signed)
Attempting to contact patient for new satinet appointment

## 2015-04-26 ENCOUNTER — Telehealth: Payer: Self-pay | Admitting: Pulmonary Disease

## 2015-04-26 NOTE — Telephone Encounter (Signed)
It looks like pt is returning a call to [facility]. Will route message to them.

## 2015-04-26 NOTE — Telephone Encounter (Addendum)
I had left a VM for patient on 04/25/15.  I wanted to advise him that [facility] would be calling to schedule an appt for his Rt Heart Cath.  I called patient and he advised that someone from the [facility] had already contacted him.  Patient states his appt is scheduled 05/16/2015.  I will document this in patient's Referral.

## 2015-04-26 NOTE — Telephone Encounter (Signed)
Pt reports he was admitted to [facility] with PNA. He will be d/c'd tomorrow. He wants to know if Dr. Kendrick Fries needs to see him and if so, how soon? Please advise Dr. Henrene Pastor thanks

## 2015-04-27 NOTE — Telephone Encounter (Signed)
F/U with me or TP 2-3 weeks with CXR

## 2015-04-27 NOTE — Telephone Encounter (Signed)
Spoke with pt. He has been scheduled to see TP on 05/17/15 at 2:15pm. Nothing further was needed.

## 2015-04-30 NOTE — Telephone Encounter (Signed)
appt sch 3/1

## 2015-05-01 ENCOUNTER — Telehealth: Payer: Self-pay | Admitting: Pulmonary Disease

## 2015-05-01 NOTE — Telephone Encounter (Signed)
Patient aware of rec's per BQ. Nothing further needed.

## 2015-05-01 NOTE — Telephone Encounter (Signed)
It is normal to feel badly for weeks after a diagnosis of pneumonia but if he is getting worse that is a problem and he needs to be seen.  Would recommend that he complete the medications as prescribed and come to the office by the end of the week if no improvement or sooner if worse.

## 2015-05-01 NOTE — Telephone Encounter (Signed)
Pt returning call.Roy Case ° °

## 2015-05-01 NOTE — Telephone Encounter (Signed)
Pt states that since D/C from hospital he feels terrible. Pt was discharged home 04/27/15 after being admitted for PNA. Pt states that he is taking Levaquin 750 QD and Prednisone and does not feel like they are helping. Pt is due to complete abx on 05/02/15 and has 3 days left on the Prednisone. Pt states that he has not been out of the house since being home. Pt is wanting to know if he needs to give the meds more time to work or if he should be seeing improvement by now? Pt states that he is not able to cough up any mucus, describes his cough as a wheezy-crackling cough and feels congested but no mucus production.  Please advise Dr Kendrick Fries. Thanks.

## 2015-05-01 NOTE — Telephone Encounter (Signed)
LMOMTCB x1 for pt 

## 2015-05-03 ENCOUNTER — Ambulatory Visit (INDEPENDENT_AMBULATORY_CARE_PROVIDER_SITE_OTHER): Payer: BLUE CROSS/BLUE SHIELD | Admitting: Family

## 2015-05-03 ENCOUNTER — Encounter: Payer: Self-pay | Admitting: Family

## 2015-05-03 VITALS — HR 61 | Temp 97.8°F | Resp 22 | Ht 70.0 in | Wt 171.0 lb

## 2015-05-03 DIAGNOSIS — J189 Pneumonia, unspecified organism: Secondary | ICD-10-CM | POA: Diagnosis not present

## 2015-05-03 MED ORDER — ALBUTEROL SULFATE (2.5 MG/3ML) 0.083% IN NEBU
5.0000 mg | INHALATION_SOLUTION | Freq: Once | RESPIRATORY_TRACT | Status: AC
  Administered 2015-05-03: 5 mg via RESPIRATORY_TRACT

## 2015-05-03 MED ORDER — PREDNISONE 20 MG PO TABS: 20.0000 mg | ORAL_TABLET | Freq: Two times a day (BID) | ORAL | Status: DC

## 2015-05-03 MED ORDER — AZITHROMYCIN 250 MG PO TABS: ORAL_TABLET | ORAL | Status: DC

## 2015-05-03 MED ORDER — ALBUTEROL SULFATE (2.5 MG/3ML) 0.083% IN NEBU: 2.5000 mg | INHALATION_SOLUTION | RESPIRATORY_TRACT | Status: AC | PRN

## 2015-05-03 NOTE — Assessment & Plan Note (Addendum)
Recently admitted to the hospital with pneumonia and reports some improvement today as he completes the prednisone. Respiratory exam with no wheezing or adventitious lung sounds. There is concern for his oxygenation as it is 86% on 5L via [place]. He is on 4L baseline at home. In office albuterol treatment provided with oxygen levels being 90% att completion of treatment. Continue current dosage of albuterol and Breo. Azithromycin prescription printed if symptoms worsen. Refill prednisone for use daily as needed. Advised to seek further care if breathing worsen or fever develops. Does have follow up with pulmonology in 2 weeks.

## 2015-05-03 NOTE — Progress Notes (Signed)
Subjective:    Patient ID: Roy Case, male    DOB: September 09, 1965, 50 y.o.   MRN: [id number]  Chief Complaint  Patient presents with  . Pneumonia    was diagnosed with pneumonia about 2 weeks ago, states that he feels a little better from the antibiotics but still has SOB, is on O2, wheezing, and bad cough    HPI:  Roy Case is a 50 y.o. male who  has a past medical history of Hypertension; Allergic rhinitis; Asthma; High cholesterol; and Cirrhosis (HCC). and presents today for an office visit to establish care.  Recently admitted to [facility] for pneumonia. He was treated with prednisone and levofloxacin. He was admitted on 2/7 and released on 2/10. Continues to experience, coughing, and shortness of breath. No fevers. He is currently on oxygen 4L via nasal cannula. He was discharged from the hospital with prednisone and a 5 day course of levofloxacin.   Allergies  Allergen Reactions  . Penicillins Anaphylaxis    Age 8  . Doxycycline Rash     Outpatient Prescriptions Prior to Visit  Medication Sig Dispense Refill  . ALPRAZolam (XANAX) 0.5 MG tablet Take 0.5 mg by mouth at bedtime as needed for anxiety.    . Fluticasone Furoate-Vilanterol (BREO ELLIPTA) 100-25 MCG/INH AEPB Inhale into the lungs.    . OXYGEN Inhale 3 L into the lungs continuous.    . propranolol (INDERAL) 40 MG tablet Take 40 mg by mouth 2 (two) times daily.    . sertraline (ZOLOFT) 25 MG tablet Take 25 mg by mouth daily.    Marland Kitchen albuterol (PROVENTIL HFA;VENTOLIN HFA) 108 (90 BASE) MCG/ACT inhaler Inhale 1 puff into the lungs every 4 (four) hours as needed for wheezing or shortness of breath. 18 g 2  . albuterol (PROVENTIL) (2.5 MG/3ML) 0.083% nebulizer solution Take 2.5 mg by nebulization every 6 (six) hours as needed for wheezing or shortness of breath.     No facility-administered medications prior to visit.     Past Medical History  Diagnosis Date  . Hypertension   . Allergic rhinitis   .  Asthma   . High cholesterol   . Cirrhosis [facility])      Past Surgical History  Procedure Laterality Date  . Cholecystectomy  may 2014  . Skin cancer excision Left     arm  . Carpal tunnel release Right      Family History  Problem Relation Age of Onset  . Lung cancer Mother   . Colon cancer Neg Hx   . Esophageal cancer Neg Hx   . Pancreatic cancer Neg Hx   . Kidney disease Neg Hx   . Liver disease Neg Hx      Social History   Social History  . Marital Status: Single    Spouse Name: N/A  . Number of Children: N/A  . Years of Education: N/A   Occupational History  . Not on file.   Social History Main Topics  . Smoking status: Never Smoker   . Smokeless tobacco: Never Used  . Alcohol Use: No  . Drug Use: No  . Sexual Activity: Not Currently   Other Topics Concern  . Not on file   Social History Narrative    Review of Systems  Constitutional: Negative for fever and chills.  Respiratory: Positive for cough, chest tightness, shortness of breath and wheezing.   Cardiovascular: Negative for chest pain, palpitations and leg swelling.  Neurological: Negative for headaches.  Objective:    Pulse 61  Temp(Src) 97.8 F (36.6 C) (Oral)  Resp 22  Ht  (1.778 m)  Wt 171 lb (77.565 kg)  BMI 24.54 kg/m2  SpO2 86% Nursing note and vital signs reviewed.  Physical Exam  Constitutional: He is oriented to person, place, and time. He appears well-developed and well-nourished. No distress.  Cardiovascular: Normal rate, regular rhythm, normal heart sounds and intact distal pulses.   Pulmonary/Chest: Effort normal and breath sounds normal. No respiratory distress.  Clubbing of fingers noted.   Neurological: He is alert and oriented to person, place, and time.  Skin: Skin is warm and dry.  Psychiatric: He has a normal mood and affect. His behavior is normal. Judgment and thought content normal.       Assessment & Plan:   Problem List Items Addressed This  Visit      Respiratory   CAP (community acquired pneumonia) - Primary    Recently admitted to the hospital with pneumonia and reports some improvement today as he completes the prednisone. Respiratory exam with no wheezing or adventitious lung sounds. There is concern for his oxygenation as it is 86% on 5L via [place]. He is on 4L baseline at home. In office albuterol treatment provided with oxygen levels being 90% att completion of treatment. Continue current dosage of albuterol and Breo. Azithromycin prescription printed if symptoms worsen. Refill prednisone for use daily as needed. Advised to seek further care if breathing worsen or fever develops. Does have follow up with pulmonology in 2 weeks.       Relevant Medications   albuterol (PROVENTIL) (2.5 MG/3ML) 0.083% nebulizer solution   predniSONE (DELTASONE) 20 MG tablet   azithromycin (ZITHROMAX) 250 MG tablet   albuterol (PROVENTIL) (2.5 MG/3ML) 0.083% nebulizer solution 5 mg (Completed)

## 2015-05-03 NOTE — Progress Notes (Signed)
Pre visit review using our clinic review tool, if applicable. No additional management support is needed unless otherwise documented below in the visit note. 

## 2015-05-03 NOTE — Patient Instructions (Signed)
Thank you for choosing [facility].  Summary/Instructions:  Your prescription(s) have been submitted to your pharmacy or been printed and provided for you. Please take as directed and contact our office if you believe you are having problem(s) with the medication(s) or have any questions.  If your symptoms worsen or fail to improve, please contact our office for further instruction, or in case of emergency go directly to the emergency room at the closest medical facility.   Please continue to take your medications as prescribed.  Increase your oxygen to 5L as needed. Continue breathing treatments as needed. Follow up if your symptoms worsen or fail to improve.  Start the Azithromycin if your symptoms worsen.

## 2015-05-16 ENCOUNTER — Ambulatory Visit (INDEPENDENT_AMBULATORY_CARE_PROVIDER_SITE_OTHER)
Admission: RE | Admit: 2015-05-16 | Discharge: 2015-05-16 | Disposition: A | Discharge status: Home / Self Care | Payer: BLUE CROSS/BLUE SHIELD | Source: Ambulatory Visit | Attending: Adult Health | Admitting: Adult Health

## 2015-05-16 ENCOUNTER — Encounter: Payer: Self-pay | Admitting: Adult Health

## 2015-05-16 ENCOUNTER — Encounter (HOSPITAL_COMMUNITY): Payer: BLUE CROSS/BLUE SHIELD

## 2015-05-16 ENCOUNTER — Ambulatory Visit (INDEPENDENT_AMBULATORY_CARE_PROVIDER_SITE_OTHER): Payer: BLUE CROSS/BLUE SHIELD | Admitting: Adult Health

## 2015-05-16 VITALS — BP 136/84 | HR 66 | Temp 97.8°F | Ht 69.0 in | Wt 175.0 lb

## 2015-05-16 DIAGNOSIS — J189 Pneumonia, unspecified organism: Secondary | ICD-10-CM

## 2015-05-16 DIAGNOSIS — J9611 Chronic respiratory failure with hypoxia: Secondary | ICD-10-CM

## 2015-05-16 MED ORDER — FLUTICASONE FUROATE-VILANTEROL 100-25 MCG/INH IN AEPB: 1.0000 | INHALATION_SPRAY | Freq: Every day | RESPIRATORY_TRACT | Status: AC

## 2015-05-16 NOTE — Patient Instructions (Addendum)
Continue breo (refill sent), albuterol nebulizer as needed  Continue prednisone 20 mg x 7 more days, then try to wean down to  per day Follow up with cardiology on 3/7 as scheduled We will check a chest xray today  Continue oxygen  Do not need to take the azithromycin for now  Follow up with Dr. Kendrick Fries as scheduled 3/30 Please contact office for sooner follow up if symptoms do not improve or worsen or seek emergency care

## 2015-05-16 NOTE — Progress Notes (Signed)
Chief Complaint  Patient presents with  . Post Hospital Follow up    o2 sat 79% on 6lpm pulsed upon arrival to exam room.  Admitted to [facility] Feb 2017 with pna.  Breathing has improved some.  Still has SOB when walking short distances, nonprod cough, and chest tightness.  No f/c/s, hemoptysis, or wheezing.       Tests 2D echo with bubble study 2/6>>> EF55-60%, POS study with R->L shunt, PFO not actually visualized.    Past medical hx Past Medical History  Diagnosis Date  . Hypertension   . Allergic rhinitis   . Asthma   . High cholesterol   . Cirrhosis (HCC)      Past surgical hx, Allergies, Family hx, Social hx all reviewed.  Current Outpatient Prescriptions on File Prior to Visit  Medication Sig  . albuterol (PROVENTIL) (2.5 MG/3ML) 0.083% nebulizer solution Take 3 mLs (2.5 mg total) by nebulization every 4 (four) hours as needed for wheezing or shortness of breath.  . ALPRAZolam (XANAX) 0.5 MG tablet Take 0.5 mg by mouth at bedtime as needed for anxiety.  . OXYGEN Inhale 4 L into the lungs continuous.   . predniSONE (DELTASONE) 20 MG tablet Take 1 tablet (20 mg total) by mouth 2 (two) times daily with a meal.  . propranolol (INDERAL) 40 MG tablet Take 40 mg by mouth 2 (two) times daily.  . sertraline (ZOLOFT) 25 MG tablet Take 25 mg by mouth daily.  Marland Kitchen azithromycin (ZITHROMAX) 250 MG tablet Take 2 tablets by mouth for 1 day and then 1 tablet by mouth daily for 4 days. (Patient not taking: Reported on 05/16/2015)   No current facility-administered medications on file prior to visit.     Vital Signs BP 136/84 mmHg  Pulse 66  Temp(Src) 97.8 F (36.6 C) (Oral)  Ht  (1.753 m)  Wt 175 lb (79.379 kg)  BMI 25.83 kg/m2  SpO2 93%  History of Present Illness Roy Case is a 50 y.o. male with hx Cirrhosis, chronic hypoxic respiratory failure, previously labeled as COPD despite being a never smoker with a normal lung exam.  Previous Alpha-1 testing normal.   Echo with bubble study 2/6 revealed right to left shunt, no PFO visualized and pt has been referred to cardiology for probable cath.   Presents today for hospital f/u after recent admit to [facility] with CAP (d/c 2/10).  Sent home on Levaquin and steroids.  Saw a new PCP 2/16 who told him to stay on prednisone  which has helped. Was also given a PRN azithro which he has not started.  Feels better overall but not back to baseline since d/c, with more SOB than usual. Still c/o cough, intermittently productive, yellow/green sputum but usually nonproductive.   Albuterol neb makes a big difference at home.  Denies fevers, chills, chest pain, pleuritic pain.  Has minimal ankle edema.    Physical Exam  General - thin, pleasant male, No distress  ENT - No sinus tenderness, no oral exudate, no LAN Cardiac - s1s2 regular, no murmur Chest -resps even, non labored on 6L pulse O2 No wheeze/rales/dullness Back - No focal tenderness Abd - Soft, non-tender Ext - No edema Neuro - Normal strength Skin - No rashes Psych - normal mood, and behavior   Assessment/Plan  Chronic hypoxic respiratory failure -- right to left shunt on recent echo.  Awaiting cards f/u this week, will likely need cath.   CAP - slow to improve given underlying  hypoxic respiratory failure.  Does not appear toxic, is improved overall.    Patient Instructions  Continue breo (refill sent), albuterol nebulizer as needed  Continue prednisone 20 mg x 7 more days, then try to wean down to  per day Follow up with cardiology on 3/7 as scheduled We will check a chest xray today  Continue oxygen  Do not need to take the azithromycin for now  Follow up with Dr. Kendrick Fries as scheduled 3/30 Please contact office for sooner follow up if symptoms do not improve or worsen or seek emergency care      Dirk Dress, NP 05/16/2015  10:37 AM

## 2015-05-17 ENCOUNTER — Inpatient Hospital Stay: Payer: BLUE CROSS/BLUE SHIELD | Admitting: Adult Health

## 2015-05-17 NOTE — Progress Notes (Signed)
Reviewed, discussed with [place] NP and agree with above

## 2015-05-17 NOTE — Progress Notes (Signed)
Quick Note:  Spoke with pt. Discussed cxr results and recs per KW. Pt verbalized understanding and voiced no further questions or concerns at this time. ______

## 2015-05-18 ENCOUNTER — Ambulatory Visit (HOSPITAL_BASED_OUTPATIENT_CLINIC_OR_DEPARTMENT_OTHER): Payer: BLUE CROSS/BLUE SHIELD | Admitting: Hematology & Oncology

## 2015-05-18 ENCOUNTER — Other Ambulatory Visit (HOSPITAL_BASED_OUTPATIENT_CLINIC_OR_DEPARTMENT_OTHER): Payer: BLUE CROSS/BLUE SHIELD

## 2015-05-18 ENCOUNTER — Encounter: Payer: Self-pay | Admitting: Hematology & Oncology

## 2015-05-18 ENCOUNTER — Ambulatory Visit: Payer: BLUE CROSS/BLUE SHIELD

## 2015-05-18 VITALS — BP 141/86 | HR 61 | Temp 97.9°F | Resp 18 | Ht 69.0 in | Wt 175.0 lb

## 2015-05-18 DIAGNOSIS — K7460 Unspecified cirrhosis of liver: Secondary | ICD-10-CM

## 2015-05-18 LAB — CBC WITH DIFFERENTIAL (CANCER CENTER ONLY)
BASO#: 0 10*3/uL (ref 0.0–0.2)
BASO%: 0.3 % (ref 0.0–2.0)
EOS ABS: 0.2 10*3/uL (ref 0.0–0.5)
EOS%: 5.4 % (ref 0.0–7.0)
HCT: 43.7 % (ref 38.7–49.9)
HGB: 16 g/dL (ref 13.0–17.1)
LYMPH#: 0.6 10*3/uL — ABNORMAL LOW (ref 0.9–3.3)
LYMPH%: 15.5 % (ref 14.0–48.0)
MCH: 39 pg — AB (ref 28.0–33.4)
MCV: 107 fL — ABNORMAL HIGH (ref 82–98)
MONO#: 0.4 10*3/uL (ref 0.1–0.9)
MONO%: 9.5 % (ref 0.0–13.0)
NEUT%: 69.3 % (ref 40.0–80.0)
NEUTROS ABS: 2.6 10*3/uL (ref 1.5–6.5)
PLATELETS: 42 10*3/uL — AB (ref 145–400)
RBC: 4.1 10*6/uL — ABNORMAL LOW (ref 4.20–5.70)
RDW: 14 % (ref 11.1–15.7)
WBC: 3.7 10*3/uL — ABNORMAL LOW (ref 4.0–10.0)

## 2015-05-18 LAB — CMP (CANCER CENTER ONLY)
ALBUMIN: 3.2 g/dL — AB (ref 3.3–5.5)
ALT(SGPT): 49 U/L — ABNORMAL HIGH (ref 10–47)
AST: 68 U/L — AB (ref 11–38)
Alkaline Phosphatase: 89 U/L — ABNORMAL HIGH (ref 26–84)
BILIRUBIN TOTAL: 3.2 mg/dL — AB (ref 0.20–1.60)
BUN: 10 mg/dL (ref 7–22)
CHLORIDE: 108 meq/L (ref 98–108)
CO2: 26 meq/L (ref 18–33)
CREATININE: 0.7 mg/dL (ref 0.6–1.2)
Calcium: 8.8 mg/dL (ref 8.0–10.3)
GLUCOSE: 88 mg/dL (ref 73–118)
Potassium: 4.2 mEq/L (ref 3.3–4.7)
SODIUM: 141 meq/L (ref 128–145)
Total Protein: 6.5 g/dL (ref 6.4–8.1)

## 2015-05-18 LAB — FERRITIN: Ferritin: 775 ng/ml — ABNORMAL HIGH (ref 22–316)

## 2015-05-18 LAB — IRON AND TIBC
IRON: 166 ug/dL — AB (ref 42–163)
TIBC: 165 ug/dL — ABNORMAL LOW (ref 202–409)
UIBC: <1 ug/dL (ref 117–376)

## 2015-05-18 LAB — CHCC SATELLITE - SMEAR

## 2015-05-21 ENCOUNTER — Telehealth: Payer: Self-pay | Admitting: *Deleted

## 2015-05-21 LAB — HEMOGLOBINOPATHY EVALUATION
HGB A: 97.7 % (ref 94.0–98.0)
HGB C: 0 %
HGB S: 0 %
Hemoglobin A2 Quantitation: 2.3 % (ref 0.7–3.1)
Hemoglobin F Quantitation: 0 % (ref 0.0–2.0)

## 2015-05-21 NOTE — Addendum Note (Signed)
Addended by: Arlan OrganENNEVER, Kahlen Morais R on: 05/21/2015 01:36 PM   Modules accepted: Orders

## 2015-05-21 NOTE — Telephone Encounter (Signed)
Patient called to update his medical history. He spoke with family and he has 2 relatives diagnosed with hemachromatosis and 4 other relatives with high iron, but no definitive diagnosis.   Notified Dr Myna HidalgoEnnever.

## 2015-05-21 NOTE — Progress Notes (Signed)
Referral MD  Reason for Referral: Iron overload, cirrhosis of unclear etiology   Chief Complaint  Patient presents with  . OTHER    New Patient  : I'm here because I had to much iron in my body.  HPI: Roy Case is a very nice 50 year old white male. He carries a diagnosis of cirrhosis. This goes back to 2014. He actually had a liver biopsy at that time. The pathology report does not show any increased iron stores. No malignancy is noted.  I'm sure he's been tested for hepatitis and HIV.  He had his gallbladder taken out at the time of the liver biopsy.  He has seen gastroenterology. He has seen other specialists. He has had issues with iron overload. He has undergone hemochromatosis assay. This is been negative.  His last ferritin was 493. His iron saturation was 88%.  I do find it interesting that he has an increased iron level but yet the genetic test, which are incredibly sensitive for hemochromatosis, are negative.  His last CT scan was done back in January. There are no suspicious liver masses. There was a 1.1 cm lesion in the left lobe. He has have mild splenomegaly. There was felt to be some portal hypertension.  He's had no bleeding.  No else in the family has had issues with liver or with iron. There is no history of early-onset heart disease.  He has no obvious occupational exposures.  He does not drink. He does not smoke.  With his lab work today, his bilirubin is 3.2. Liver function tests are slightly elevated. His albumin is 3.2.  His ferritin is 775. Iron saturations greater than 100%.  He comes in with 2 aunts. They live in [name].    Past Medical History  Diagnosis Date  . Hypertension   . Allergic rhinitis   . Asthma   . High cholesterol   . Cirrhosis ([place])   :  Past Surgical History  Procedure Laterality Date  . Cholecystectomy  may 2014  . Skin cancer excision Left     arm  . Carpal tunnel release Right   :   Current outpatient  prescriptions:  .  albuterol (PROVENTIL HFA;VENTOLIN HFA) 108 (90 Base) MCG/ACT inhaler, Inhale 2 puffs into the lungs every 6 (six) hours as needed for wheezing or shortness of breath., Disp: , Rfl:  .  albuterol (PROVENTIL) (2.5 MG/3ML) 0.083% nebulizer solution, Take 3 mLs (2.5 mg total) by nebulization every 4 (four) hours as needed for wheezing or shortness of breath., Disp: 75 mL, Rfl: 2 .  ALPRAZolam (XANAX) 0.5 MG tablet, Take 0.5 mg by mouth at bedtime as needed for anxiety., Disp: , Rfl:  .  azithromycin (ZITHROMAX) 250 MG tablet, Take 2 tablets by mouth for 1 day and then 1 tablet by mouth daily for 4 days., Disp: 6 tablet, Rfl: 0 .  fluticasone furoate-vilanterol (BREO ELLIPTA) 100-25 MCG/INH AEPB, Inhale 1 puff into the lungs daily. Reported on 05/16/2015, Disp: 28 each, Rfl: 1 .  OXYGEN, Inhale 4 L into the lungs continuous. , Disp: , Rfl:  .  predniSONE (DELTASONE) 20 MG tablet, Take 1 tablet (20 mg total) by mouth 2 (two) times daily with a meal., Disp: 20 tablet, Rfl: 0 .  propranolol (INDERAL) 40 MG tablet, Take 40 mg by mouth 2 (two) times daily., Disp: , Rfl:  .  sertraline (ZOLOFT) 25 MG tablet, Take 25 mg by mouth daily., Disp: , Rfl: :  :  Allergies  Allergen Reactions  .  Penicillins Anaphylaxis    Age 68  . Doxycycline Rash  :  Family History  Problem Relation Age of Onset  . Lung cancer Mother   . Colon cancer Neg Hx   . Esophageal cancer Neg Hx   . Pancreatic cancer Neg Hx   . Kidney disease Neg Hx   . Liver disease Neg Hx   :  Social History   Social History  . Marital Status: Single    Spouse Name: N/A  . Number of Children: N/A  . Years of Education: N/A   Occupational History  . Not on file.   Social History Main Topics  . Smoking status: Never Smoker   . Smokeless tobacco: Never Used  . Alcohol Use: No  . Drug Use: No  . Sexual Activity: Not Currently   Other Topics Concern  . Not on file   Social History Narrative  :  Pertinent items  are noted in HPI.  Exam: '@IPVITALS' @ Thin but well-nourished white male in no obvious distress. Vital signs are temperature of 97.9. Pulse 61. Blood pressure 141/80. Weight is under 75 pounds. Head and neck exam shows no scleral icterus. He has good extra ocular muscle motion. No oral lesions are noted. No adenopathy noted in the neck. Lungs are clear. Cardiac exam regular rate and rhythm with no murmurs, rubs or bruits. Abdomen is soft. Has good bowel sounds. There is no fluid wave. There is no guarding or rebound tenderness. His spleen tip might be palpable deep inspiration. I really cannot palpate his liver. Back exam shows no tenderness over the spine, ribs or hips. Extremity shows no clubbing, cyanosis or edema. Has decent range of motion of his joints. Neurological exam shows no focal neurological deficits. Skin exam shows no rashes, ecchymoses or petechia.    Recent Labs  05/18/15 1023  WBC 3.7*  HGB 16.0  HCT 43.7  PLT 42*    Recent Labs  05/18/15 1024  NA 141  K 4.2  CL 108  CO2 26  GLUCOSE 88  BUN 10  CREATININE 0.7  CALCIUM 8.8    Blood smear review: Normochromic and normocytic population of red blood cells. He has no nucleated red blood cells. There are no target cells. There is no schistocytes. He has no rouleau formation. White cells are normal in morphology maturation. There are no hypersegmented polys. There is no immature myeloid or lymphoid forms. Platelets are decreased in number. He has a few large platelets.  Pathology: None     Assessment and Plan:  Roy Case is a 50 year old white male. He has cirrhosis. I suspect that is probably [place]. The fact that the liver biopsy 3 years ago did not show any excess iron and the biopsy would make hemachromatosis as a cause for liver cirrhosis highly unlikely.  I'm not sure as to why he would have the iron issues. I think that he needs a bone marrow biopsy. I wonder if there might be something going on with his bone  marrow that might be causing ineffective erythropoiesis that might be leading to excess iron.  He is not taking any iron supplements. He is not taking vitamin C.  He clearly is at high-risk for hepatocellular carcinoma. I think that it be helpful to get another MRI of the liver to see once going on with these spots seen on the CT scan done in January.  We certainly could get him on a phlebotomy program. However, the we have to get a better  sense as to what is going on with him first.  I spent about an hour with he and his 2 aunts. He is very nice. He has a good attitude.  We will plan to get him back once all of our studies are done. We still may ultimately get him on a phlebotomy program.

## 2015-05-22 ENCOUNTER — Other Ambulatory Visit (HOSPITAL_COMMUNITY): Payer: Self-pay | Admitting: *Deleted

## 2015-05-22 ENCOUNTER — Ambulatory Visit (HOSPITAL_COMMUNITY)
Admission: RE | Admit: 2015-05-22 | Discharge: 2015-05-22 | Disposition: A | Discharge status: Home / Self Care | Payer: BLUE CROSS/BLUE SHIELD | Source: Ambulatory Visit | Attending: Cardiology | Admitting: Cardiology

## 2015-05-22 ENCOUNTER — Encounter (HOSPITAL_COMMUNITY): Payer: Self-pay | Admitting: *Deleted

## 2015-05-22 ENCOUNTER — Encounter (HOSPITAL_COMMUNITY): Payer: Self-pay

## 2015-05-22 VITALS — BP 140/81 | HR 65 | Resp 20 | Wt 172.0 lb

## 2015-05-22 DIAGNOSIS — K7460 Unspecified cirrhosis of liver: Secondary | ICD-10-CM | POA: Diagnosis present

## 2015-05-22 DIAGNOSIS — J45909 Unspecified asthma, uncomplicated: Secondary | ICD-10-CM | POA: Diagnosis not present

## 2015-05-22 DIAGNOSIS — J9611 Chronic respiratory failure with hypoxia: Secondary | ICD-10-CM | POA: Diagnosis not present

## 2015-05-22 DIAGNOSIS — D696 Thrombocytopenia, unspecified: Secondary | ICD-10-CM | POA: Insufficient documentation

## 2015-05-22 DIAGNOSIS — I10 Essential (primary) hypertension: Secondary | ICD-10-CM | POA: Diagnosis not present

## 2015-05-22 DIAGNOSIS — Z7952 Long term (current) use of systemic steroids: Secondary | ICD-10-CM | POA: Insufficient documentation

## 2015-05-22 DIAGNOSIS — Z9981 Dependence on supplemental oxygen: Secondary | ICD-10-CM | POA: Insufficient documentation

## 2015-05-22 DIAGNOSIS — R161 Splenomegaly, not elsewhere classified: Secondary | ICD-10-CM | POA: Insufficient documentation

## 2015-05-22 DIAGNOSIS — Z79899 Other long term (current) drug therapy: Secondary | ICD-10-CM | POA: Diagnosis not present

## 2015-05-22 DIAGNOSIS — I272 Pulmonary hypertension, unspecified: Secondary | ICD-10-CM

## 2015-05-22 LAB — HEMOCHROMATOSIS DNA-PCR(C282Y,H63D)

## 2015-05-22 NOTE — Progress Notes (Signed)
Patient ID: Roy Case, male   DOB: 06/20/1965, 49 y.o.   MRN: [id number] PCP: Roy Case Pulmonary: Roy Case Cardiology: Roy Case  49 yo with cirrhosis and chronic hypoxemic respiratory failure and positive bubble study presents for cardiology evaluation.  He has been on home oxygen since 7/14.  He was labeled as COPD but never smoked.  Alpha-1 antitrypsin testing was negative.  PFTs in 1/17 showed very mild obstruction but severely decreased DLCO.  Echo in 2/17 showed normal LV size and systolic function and normal RV, but there was a positive bubble study (could not identify PFO).  CTA chest in 7/15 showed minimal parenchymal disease and no PE.  He has been noted to have cirrhosis with splenomegaly and low platelets.  Never a heavy drinker.  Iron stores were very high raising concern for hemochromatosis.    Currently, he does well getting around the house.  He is short of breath with steps and short of breath walking more than 1 block on flat ground.  No orthopnea/ PND.  No chest pain.   Labs (3/17): K 4.2, creatinine 0.7, AST 68, ALT 49, tbili 3.2, ferritin 775, 100% transferrin saturation, HCT 39, plts 42. Hemochromatosis genetic testing negative.   PMH: 1. HTN 2. Asthma 3. Cirrhosis: ?NASH.  Also considering hemochromatosis.  4. Chronic hypoxemic respiratory failure:  On home oxygen.  - CTA chest (7/15) with no PE and minimal parenchymal disease. - PFTs (1/17) with mild obstruction, severely decreased DLCO.  - Echo (2/17) with EF 55-60%, RV normal size and systolic function, unable to identify PFO but bubble study positive.  - alpha-1 antitrypsin negative.  5. Splenomegaly with thrombocytopenia. 6. Elevated ferritin: ?hemochromatosis. Genetic testing for hemochromatosis negative.   SH: Nonsmoker, no ETOH, lives in [place]  Family History  Problem Relation Age of Onset  . Lung cancer Mother   . Colon cancer Neg Hx   . Esophageal cancer Neg Hx   . Pancreatic cancer Neg Hx   . Kidney  disease Neg Hx   . Liver disease Neg Hx    ROS: All systems reviewed and negative except as per HPI.   Current Outpatient Prescriptions  Medication Sig Dispense Refill  . albuterol (PROVENTIL HFA;VENTOLIN HFA) 108 (90 Base) MCG/ACT inhaler Inhale 2 puffs into the lungs every 6 (six) hours as needed for wheezing or shortness of breath.    . albuterol (PROVENTIL) (2.5 MG/3ML) 0.083% nebulizer solution Take 3 mLs (2.5 mg total) by nebulization every 4 (four) hours as needed for wheezing or shortness of breath. 75 mL 2  . ALPRAZolam (XANAX) 0.5 MG tablet Take 0.5 mg by mouth at bedtime as needed for anxiety.    . fluticasone furoate-vilanterol (BREO ELLIPTA) 100-25 MCG/INH AEPB Inhale 1 puff into the lungs daily. Reported on 05/16/2015 28 each 1  . OXYGEN Inhale 4 L into the lungs continuous.     . predniSONE (DELTASONE) 20 MG tablet Take 1 tablet (20 mg total) by mouth 2 (two) times daily with a meal. 20 tablet 0  . propranolol (INDERAL) 40 MG tablet Take 40 mg by mouth 2 (two) times daily.    . sertraline (ZOLOFT) 25 MG tablet Take 25 mg by mouth daily.     No current facility-administered medications for this encounter.   BP 140/81 mmHg  Pulse 65  Resp 20  Wt 172 lb (78.019 kg)  SpO2 90% General: NAD Neck: No JVD, no thyromegaly or thyroid nodule.  Lungs: Clear to auscultation bilaterally with normal respiratory effort. CV:   Nondisplaced PMI.  Heart regular S1/S2, no S3/S4, no murmur.  No peripheral edema.  No carotid bruit.  Normal pedal pulses.  Abdomen: Soft, nontender, no hepatosplenomegaly, no distention.  Skin: Intact without lesions or rashes.  Neurologic: Alert and oriented x 3.  Psych: Normal affect. Extremities: No clubbing or cyanosis.  HEENT: Normal.   Assessment/Plan: 1. Cirrhosis: With splenomegaly and low platelets.  Never a heavy drinker, workup for viral hepatitis negative. His iron stores are very elevated with ferritin 775, however genetic workup for hemochromatosis  was negative.  Cannot, however, rule out a rare mutation.  - Ongoing workup with hematology regarding hemochromatosis.   2. Chronic hypoxemic respiratory failure: Cause of hypoxemia is uncertain.  PFTs showed only mild obstruction with very decreased DLCO suggesting pulmonary vascular disease.  CTA chest in 7/15 showed no PE and minimal pulmonary parenchymal disease.  Echo did not show markedly abnormal RV and had positive bubble study but PFO not visualized. I am concerned for hepatopulmonary syndrome with or without porto-pulmonary hypertension.  Hepatopulmonary syndrome would explain the positive bubble study without PFO visualized and could cause the hypoxemia.  I do think that we need to work him up for pulmonary hypertension.  - I will arrange RHC to assess for pulmonary hypertension (most likely would be porto-pulmonary hypertension).  Platelets are low, will use brachial access if possible.  I discussed risks/benefits of the procedure with the patient and he agrees to proceed. He will need a CBC for plts on the day of cath.   Roy Case 05/22/2015   

## 2015-05-22 NOTE — Patient Instructions (Signed)
Heart Catheterization scheduled for Thur 3/16, see instruction sheet  Your physician recommends that you schedule a follow-up appointment in: 4 weeks

## 2015-05-25 ENCOUNTER — Encounter: Payer: Self-pay | Admitting: Hematology & Oncology

## 2015-05-26 ENCOUNTER — Ambulatory Visit (HOSPITAL_BASED_OUTPATIENT_CLINIC_OR_DEPARTMENT_OTHER): Payer: BLUE CROSS/BLUE SHIELD

## 2015-05-29 ENCOUNTER — Other Ambulatory Visit: Payer: Self-pay | Admitting: Radiology

## 2015-05-30 ENCOUNTER — Encounter (HOSPITAL_COMMUNITY): Payer: Self-pay

## 2015-05-30 ENCOUNTER — Ambulatory Visit (HOSPITAL_COMMUNITY)
Admission: RE | Admit: 2015-05-30 | Discharge: 2015-05-30 | Disposition: A | Discharge status: Home / Self Care | Payer: BLUE CROSS/BLUE SHIELD | Source: Ambulatory Visit | Attending: Hematology & Oncology | Admitting: Hematology & Oncology

## 2015-05-30 DIAGNOSIS — D7589 Other specified diseases of blood and blood-forming organs: Secondary | ICD-10-CM | POA: Diagnosis not present

## 2015-05-30 DIAGNOSIS — Z79899 Other long term (current) drug therapy: Secondary | ICD-10-CM | POA: Insufficient documentation

## 2015-05-30 DIAGNOSIS — K7460 Unspecified cirrhosis of liver: Secondary | ICD-10-CM | POA: Insufficient documentation

## 2015-05-30 DIAGNOSIS — E7800 Pure hypercholesterolemia, unspecified: Secondary | ICD-10-CM | POA: Insufficient documentation

## 2015-05-30 DIAGNOSIS — R7989 Other specified abnormal findings of blood chemistry: Secondary | ICD-10-CM | POA: Diagnosis not present

## 2015-05-30 DIAGNOSIS — I10 Essential (primary) hypertension: Secondary | ICD-10-CM | POA: Insufficient documentation

## 2015-05-30 DIAGNOSIS — Z881 Allergy status to other antibiotic agents status: Secondary | ICD-10-CM | POA: Insufficient documentation

## 2015-05-30 DIAGNOSIS — J45909 Unspecified asthma, uncomplicated: Secondary | ICD-10-CM | POA: Insufficient documentation

## 2015-05-30 DIAGNOSIS — Z880 Allergy status to penicillin: Secondary | ICD-10-CM | POA: Diagnosis not present

## 2015-05-30 DIAGNOSIS — D696 Thrombocytopenia, unspecified: Secondary | ICD-10-CM | POA: Diagnosis not present

## 2015-05-30 HISTORY — PX: BONE MARROW BIOPSY: SHX199

## 2015-05-30 LAB — CBC
HEMATOCRIT: 46.7 % (ref 39.0–52.0)
Hemoglobin: 16.7 g/dL (ref 13.0–17.0)
MCH: 39 pg — AB (ref 26.0–34.0)
MCHC: 35.8 g/dL (ref 30.0–36.0)
MCV: 109.1 fL — AB (ref 78.0–100.0)
Platelets: 67 10*3/uL — ABNORMAL LOW (ref 150–400)
RBC: 4.28 MIL/uL (ref 4.22–5.81)
RDW: 14.2 % (ref 11.5–15.5)
WBC: 5.3 10*3/uL (ref 4.0–10.5)

## 2015-05-30 LAB — BONE MARROW EXAM

## 2015-05-30 LAB — APTT: aPTT: 41 seconds — ABNORMAL HIGH (ref 24–37)

## 2015-05-30 LAB — PROTIME-INR
INR: 1.51 — ABNORMAL HIGH (ref 0.00–1.49)
Prothrombin Time: 18.3 seconds — ABNORMAL HIGH (ref 11.6–15.2)

## 2015-05-30 MED ORDER — MIDAZOLAM HCL 2 MG/2ML IJ SOLN
INTRAMUSCULAR | Status: AC
  Filled 2015-05-30: qty 6

## 2015-05-30 MED ORDER — SODIUM CHLORIDE 0.9 % IV SOLN: Freq: Once | INTRAVENOUS | Status: DC

## 2015-05-30 MED ORDER — ALBUTEROL SULFATE (2.5 MG/3ML) 0.083% IN NEBU
2.5000 mg | INHALATION_SOLUTION | RESPIRATORY_TRACT | Status: AC
  Administered 2015-05-30: 2.5 mg via RESPIRATORY_TRACT
  Filled 2015-05-30 (×2): qty 3

## 2015-05-30 MED ORDER — FENTANYL CITRATE (PF) 100 MCG/2ML IJ SOLN
INTRAMUSCULAR | Status: AC | PRN
  Administered 2015-05-30 (×2): 25 ug via INTRAVENOUS

## 2015-05-30 MED ORDER — FENTANYL CITRATE (PF) 100 MCG/2ML IJ SOLN
INTRAMUSCULAR | Status: AC
  Filled 2015-05-30: qty 4

## 2015-05-30 MED ORDER — MIDAZOLAM HCL 2 MG/2ML IJ SOLN
INTRAMUSCULAR | Status: AC | PRN
  Administered 2015-05-30 (×3): 1 mg via INTRAVENOUS

## 2015-05-30 NOTE — H&P (Signed)
Chief Complaint: Cirrhosis Excessive iron stores  Referring Physician(s): Ennever,Peter R  Supervising Physician: Sandi Mariscal  History of Present Illness: Roy Case is a 50 y.o. male with cirrhosis and excessive iron stores.  He has had a liver biopsy in the past however this was negative for hemochromatosis.  His most recent ferritin is 775 with iron staurations greater than 100%.  We are asked to perform a CT guided bone marrow biopsy to evaluate for ineffective erythropoiesis that might be leading to excess iron.  He feels well today. He denies recent illness, fever, chills.  He does not take blood thinners.  He is NPO  He has asthma that is "fairly" controlled with inhalers per patient.  He is on 4 liters O2 via [place] at home.  Last breathing treatment was last night.  Past Medical History  Diagnosis Date  . Hypertension   . Allergic rhinitis   . Asthma   . High cholesterol   . Cirrhosis [facility])     Past Surgical History  Procedure Laterality Date  . Cholecystectomy  may 2014  . Skin cancer excision Left     arm  . Carpal tunnel release Right     Allergies: Penicillins and Doxycycline  Medications: Prior to Admission medications   Medication Sig Start Date End Date Taking? Authorizing Provider  acetaminophen (TYLENOL) 500 MG tablet Take 1,000 mg by mouth every 6 (six) hours as needed for moderate pain or headache.    Historical Provider, MD  albuterol (PROVENTIL HFA;VENTOLIN HFA) 108 (90 Base) MCG/ACT inhaler Inhale 2 puffs into the lungs every 6 (six) hours as needed for wheezing or shortness of breath.    Historical Provider, MD  albuterol (PROVENTIL) (2.5 MG/3ML) 0.083% nebulizer solution Take 3 mLs (2.5 mg total) by nebulization every 4 (four) hours as needed for wheezing or shortness of breath. 05/03/15   Golden Circle, FNP  ALPRAZolam Duanne Moron) 0.5 MG tablet Take 0.5 mg by mouth 2 (two) times daily as needed for anxiety.     Historical Provider,  MD  fluticasone furoate-vilanterol (BREO ELLIPTA) 100-25 MCG/INH AEPB Inhale 1 puff into the lungs daily. Reported on 05/16/2015 05/16/15   Marijean Heath, NP  OXYGEN Inhale 4 L into the lungs continuous.     Historical Provider, MD  predniSONE (DELTASONE) 20 MG tablet Take 1 tablet (20 mg total) by mouth 2 (two) times daily with a meal. Patient not taking: Reported on 05/29/2015 05/03/15   Golden Circle, FNP  propranolol (INDERAL) 40 MG tablet Take 40 mg by mouth 2 (two) times daily.    Historical Provider, MD  sertraline (ZOLOFT) 25 MG tablet Take 25 mg by mouth daily.    Historical Provider, MD     Family History  Problem Relation Age of Onset  . Lung cancer Mother   . Colon cancer Neg Hx   . Esophageal cancer Neg Hx   . Pancreatic cancer Neg Hx   . Kidney disease Neg Hx   . Liver disease Neg Hx     Social History   Social History  . Marital Status: Single    Spouse Name: N/A  . Number of Children: N/A  . Years of Education: N/A   Social History Main Topics  . Smoking status: Never Smoker   . Smokeless tobacco: Never Used  . Alcohol Use: No  . Drug Use: No  . Sexual Activity: Not Currently   Other Topics Concern  . Not on file  Social History Narrative    Review of Systems: A 12 point ROS discussed  Review of Systems  Constitutional: Negative for fever, chills, activity change, appetite change and fatigue.  HENT: Negative.   Respiratory: Negative for cough, chest tightness, shortness of breath and wheezing.   Cardiovascular: Negative for chest pain.  Gastrointestinal: Negative for nausea, vomiting, abdominal pain and abdominal distention.  Genitourinary: Negative.   Musculoskeletal: Negative.   Skin: Negative.   Neurological: Negative.   Psychiatric/Behavioral: Negative.     Vital Signs: 140/81 Pulse 65 Resp 20  Physical Exam  Constitutional: He is oriented to person, place, and time. He appears well-developed and well-nourished.  HENT:  Head:  Normocephalic and atraumatic.  Eyes: EOM are normal.  Neck: Neck supple.  Cardiovascular: Normal rate, regular rhythm and normal heart sounds.   No murmur heard. Pulmonary/Chest: Effort normal. No respiratory distress. He has no wheezes.  Diminshed breath sounds bilaterally secondary to asthma. No wheezes heard  Abdominal: Soft. Bowel sounds are normal.  Musculoskeletal: Normal range of motion.  Neurological: He is alert and oriented to person, place, and time.  Skin: Skin is warm and dry.  Psychiatric: He has a normal mood and affect. His behavior is normal. Judgment and thought content normal.  Vitals reviewed.   Mallampati Score:  MD Evaluation Airway: WNL Heart: WNL Abdomen: WNL Chest/ Lungs: Other (comments) Chest/ lungs comments: Diminished breath sounds. No wheezes. On 4L [place]. Albuterol neb ordered pre-op ASA  Classification: 3 Mallampati/Airway Score: One  Imaging: Dg Chest 2 View  05/16/2015  CLINICAL DATA:  Cough.  Congestion.  Shortness of breath. EXAM: CHEST  2 VIEW COMPARISON:  04/25/2015 . FINDINGS: Mediastinum and hilar structures normal. Heart size normal. Bilateral mild interstitial prominence suggesting mild pneumonitis. Small right pleural effusion. Surgical clips right upper quadrant. IMPRESSION: Mild bilateral interstitial prominence suggesting mild pneumonitis. Small right pleural effusion. Electronically Signed   By: Marcello Moores  Register   On: 05/16/2015 11:23    Labs:  CBC:  Recent Labs  12/28/14 1618 04/04/15 1057 05/18/15 1023  WBC 4.3 4.1 3.7*  HGB 17.8* 17.6* 16.0  HCT 51.8 51.4 43.7  PLT 61.0 Repeated and verified X2.* 67.0 Repeated and verified X2.* 42*    COAGS:  Recent Labs  04/04/15 1057 05/30/15 0930  INR 1.8* 1.51*  APTT  --  41*    BMP:  Recent Labs  04/04/15 1057 05/18/15 1024  NA 141 141  K 3.8 4.2  CL 110 108  CO2 23 26  GLUCOSE 76 88  BUN 9 10  CALCIUM 8.5 8.8  CREATININE 0.65 0.7    LIVER FUNCTION  TESTS:  Recent Labs  04/04/15 1057 05/18/15 1024  BILITOT 2.7* 3.20*  AST 57* 68*  ALT 36 49*  ALKPHOS 83 89*  PROT 6.6 6.5  ALBUMIN 3.0* 3.2*    TUMOR MARKERS: No results for input(s): AFPTM, CEA, CA199, CHROMGRNA in the last 8760 hours.  Assessment and Plan:  Cirrhosis Elevated iron/ferritin  Will proceed with CT guided bone marrow biopsy today by Dr. Pascal Lux.  Risks and Benefits discussed with the patient including, but not limited to bleeding, infection, damage to adjacent structures or low yield requiring additional tests.  All of the patient's questions were answered, patient is agreeable to proceed. Consent signed and in chart.  Asthma = will order pre-op Albuterol nebulizer.  Thank you for this interesting consult.  I greatly enjoyed meeting Roy Case and look forward to participating in their care.  A copy  of this report was sent to the requesting provider on this date.  Electronically Signed: Murrell Redden PA-C 05/30/2015, 9:50 AM   I spent a total of  30 Minutes in face to face in clinical consultation, greater than 50% of which was counseling/coordinating care for CT guided bone marrow biopsy.

## 2015-05-30 NOTE — Discharge Instructions (Signed)
Moderate Conscious Sedation, Adult, Care After Refer to this sheet in the next few weeks. These instructions provide you with information on caring for yourself after your procedure. Your health care provider may also give you more specific instructions. Your treatment has been planned according to current medical practices, but problems sometimes occur. Call your health care provider if you have any problems or questions after your procedure. WHAT TO EXPECT AFTER THE PROCEDURE  After your procedure:  You may feel sleepy, clumsy, and have poor balance for several hours.  Vomiting may occur if you eat too soon after the procedure. HOME CARE INSTRUCTIONS  Do not participate in any activities where you could become injured for at least 24 hours. Do not:  Drive.  Swim.  Ride a bicycle.  Operate heavy machinery.  Cook.  Use power tools.  Climb ladders.  Work from a high place.  Do not make important decisions or sign legal documents until you are improved.  If you vomit, drink water, juice, or soup when you can drink without vomiting. Make sure you have little or no nausea before eating solid foods.  Only take over-the-counter or prescription medicines for pain, discomfort, or fever as directed by your health care provider.  Make sure you and your family fully understand everything about the medicines given to you, including what side effects may occur.  You should not drink alcohol, take sleeping pills, or take medicines that cause drowsiness for at least 24 hours.  If you smoke, do not smoke without supervision.  If you are feeling better, you may resume normal activities 24 hours after you were sedated.  Keep all appointments with your health care provider. SEEK MEDICAL CARE IF:  Your skin is pale or bluish in color.  You continue to feel nauseous or vomit.  Your pain is getting worse and is not helped by medicine.  You have bleeding or swelling.  You are still  sleepy or feeling clumsy after 24 hours. SEEK IMMEDIATE MEDICAL CARE IF:  You develop a rash.  You have difficulty breathing.  You develop any type of allergic problem.  You have a fever. MAKE SURE YOU:  Understand these instructions.  Will watch your condition.  Will get help right away if you are not doing well or get worse.   This information is not intended to replace advice given to you by your health care provider. Make sure you discuss any questions you have with your health care provider.   Document Released: 12/22/2012 Document Revised: 03/24/2014 Document Reviewed: 12/22/2012 Elsevier Interactive Patient Education 2016 [place].   Bone Marrow Aspiration and Bone Marrow Biopsy, Care After Refer to this sheet in the next few weeks. These instructions provide you with information about caring for yourself after your procedure. Your health care provider may also give you more specific instructions. Your treatment has been planned according to current medical practices, but problems sometimes occur. Call your health care provider if you have any problems or questions after your procedure. WHAT TO EXPECT AFTER THE PROCEDURE After your procedure, it is common to have:  Soreness or tenderness around the puncture site.  Bruising. HOME CARE INSTRUCTIONS  Take medicines only as directed by your health care provider.  Follow your health care provider's instructions about:  Puncture site care.  Bandage (dressing) changes and removal.  Bathe and shower as directed by your health care provider.  Check your puncture site every day for signs of infection. Watch for:  Redness, swelling,  or pain.  Fluid, blood, or pus.  Return to your normal activities as directed by your health care provider.  Keep all follow-up visits as directed by your health care provider. This is important. SEEK MEDICAL CARE IF:  You have a fever.  You have uncontrollable bleeding.  You  have redness, swelling, or pain at the site of your puncture.  You have fluid, blood, or pus coming from your puncture site.   This information is not intended to replace advice given to you by your health care provider. Make sure you discuss any questions you have with your health care provider.   Document Released: 09/20/2004 Document Revised: 07/18/2014 Document Reviewed: 02/22/2014 Elsevier Interactive Patient Education [employer].

## 2015-05-30 NOTE — Procedures (Signed)
Technically successful CT guided bone marrow aspiration and biopsy of left iliac crest. EBL: None No immediate complications.    SignedSimonne Come: Etienne Mowers Pager: [phone]: [phone] 05/30/2015, 11:34 AM

## 2015-05-30 NOTE — Discharge Instructions (Signed)
Bone Marrow Aspiration and Bone Marrow Biopsy Bone marrow aspiration and bone marrow biopsy are procedures that are done to diagnose blood disorders. You may also have one of these procedures to help diagnose infections or some types of cancer. Bone marrow is the soft tissue that is inside your bones. Blood cells are produced in bone marrow. For bone marrow aspiration, a sample of tissue in liquid form is removed from inside your bone. For a bone marrow biopsy, a small core of bone marrow tissue is removed. Then these samples are examined under a microscope or tested in a lab. You may need these procedures if you have an abnormal complete blood count (CBC). The aspiration or biopsy sample is usually taken from the top of your hip bone. Sometimes, an aspiration sample is taken from your chest bone (sternum). LET [facility] CARE PROVIDER KNOW ABOUT:  Any allergies you have.  All medicines you are taking, including vitamins, herbs, eye drops, creams, and over-the-counter medicines.  Previous problems you or members of your family have had with the use of anesthetics.  Any blood disorders you have.  Previous surgeries you have had.  Any medical conditions you may have.  Whether you are pregnant or you think that you may be pregnant. RISKS AND COMPLICATIONS Generally, this is a safe procedure. However, problems may occur, including:  Infection.  Bleeding. BEFORE THE PROCEDURE  Ask your health care provider about:  Changing or stopping your regular medicines. This is especially important if you are taking diabetes medicines or blood thinners.  Taking medicines such as aspirin and ibuprofen. These medicines can thin your blood. Do not take these medicines before your procedure if your health care provider instructs you not to.  Plan to have someone take you home after the procedure.  If you go home right after the procedure, plan to have someone with you for 24 hours. PROCEDURE   An  IV tube may be inserted into one of your veins.  The injection site will be cleaned with a germ-killing solution (antiseptic).  You will be given one or more of the following:  A medicine that helps you relax (sedative).  A medicine that numbs the area (local anesthetic).  The bone marrow sample will be removed as follows:  For an aspiration, a hollow needle will be inserted through your skin and into your bone. Bone marrow fluid will be drawn up into a syringe.  For a biopsy, your health care provider will use a hollow needle to remove a core of tissue from your bone marrow.  The needle will be removed.  A bandage (dressing) will be placed over the insertion site and taped in place. The procedure may vary among health care providers and hospitals. AFTER THE PROCEDURE  Your blood pressure, heart rate, breathing rate, and blood oxygen level will be monitored often until the medicines you were given have worn off.  Return to your normal activities as directed by your health care provider.   This information is not intended to replace advice given to you by your health care provider. Make sure you discuss any questions you have with your health care provider.   Document Released: 03/06/2004 Document Revised: 07/18/2014 Document Reviewed: 02/22/2014 Elsevier Interactive Patient Education 2016 [place]. Bone Marrow Aspiration and Bone Marrow Biopsy, Care After Refer to this sheet in the next few weeks. These instructions provide you with information about caring for yourself after your procedure. Your health care provider may also give you  more specific instructions. Your treatment has been planned according to current medical practices, but problems sometimes occur. Call your health care provider if you have any problems or questions after your procedure. WHAT TO EXPECT AFTER THE PROCEDURE After your procedure, it is common to have:  Soreness or tenderness around the puncture  site.  Bruising. HOME CARE INSTRUCTIONS  Take medicines only as directed by your health care provider.  Follow your health care provider's instructions about:  Puncture site care.  Bandage (dressing) changes and removal.  Bathe and shower as directed by your health care provider.  Check your puncture site every day for signs of infection. Watch for:  Redness, swelling, or pain.  Fluid, blood, or pus.  Return to your normal activities as directed by your health care provider.  Keep all follow-up visits as directed by your health care provider. This is important. SEEK MEDICAL CARE IF:  You have a fever.  You have uncontrollable bleeding.  You have redness, swelling, or pain at the site of your puncture.  You have fluid, blood, or pus coming from your puncture site.   This information is not intended to replace advice given to you by your health care provider. Make sure you discuss any questions you have with your health care provider.   Document Released: 09/20/2004 Document Revised: 07/18/2014 Document Reviewed: 02/22/2014 Elsevier Interactive Patient Education 2016 Elsevier Inc[employer] Moderate Conscious Sedation, Adult Sedation is the use of medicines to promote relaxation and relieve discomfort and anxiety. Moderate conscious sedation is a type of sedation. Under moderate conscious sedation you are less alert than normal but are still able to respond to instructions or stimulation. Moderate conscious sedation is used during short medical and dental procedures. It is milder than deep sedation or general anesthesia and allows you to return to your regular activities sooner. LET [facility] CARE PROVIDER KNOW ABOUT:   Any allergies you have.  All medicines you are taking, including vitamins, herbs, eye drops, creams, and over-the-counter medicines.  Use of steroids (by mouth or creams).  Previous problems you or members of your family have had with the use of  anesthetics.  Any blood disorders you have.  Previous surgeries you have had.  Medical conditions you have.  Possibility of pregnancy, if this applies.  Use of cigarettes, alcohol, or illegal drugs. RISKS AND COMPLICATIONS Generally, this is a safe procedure. However, as with any procedure, problems can occur. Possible problems include:  Oversedation.  Trouble breathing on your own. You may need to have a breathing tube until you are awake and breathing on your own.  Allergic reaction to any of the medicines used for the procedure. BEFORE THE PROCEDURE  You may have blood tests done. These tests can help show how well your kidneys and liver are working. They can also show how well your blood clots.  A physical exam will be done.  Only take medicines as directed by your health care provider. You may need to stop taking medicines (such as blood thinners, aspirin, or nonsteroidal anti-inflammatory drugs) before the procedure.   Do not eat or drink at least 6 hours before the procedure or as directed by your health care provider.  Arrange for a responsible adult, family member, or friend to take you home after the procedure. He or she should stay with you for at least 24 hours after the procedure, until the medicine has worn off. PROCEDURE   An intravenous (IV) catheter will be inserted into one of your  veins. Medicine will be able to flow directly into your body through this catheter. You may be given medicine through this tube to help prevent pain and help you relax.  The medical or dental procedure will be done. AFTER THE PROCEDURE  You will stay in a recovery area until the medicine has worn off. Your blood pressure and pulse will be checked.   Depending on the procedure you had, you may be allowed to go home when you can tolerate liquids and your pain is under control.   This information is not intended to replace advice given to you by your health care provider. Make  sure you discuss any questions you have with your health care provider.   Document Released: 11/26/2000 Document Revised: 03/24/2014 Document Reviewed: 11/08/2012 Elsevier Interactive Patient Education [employer].

## 2015-05-31 ENCOUNTER — Ambulatory Visit (HOSPITAL_COMMUNITY)
Admission: RE | Admit: 2015-05-31 | Discharge: 2015-05-31 | Disposition: A | Discharge status: Home / Self Care | Payer: BLUE CROSS/BLUE SHIELD | Source: Ambulatory Visit | Attending: Cardiology | Admitting: Cardiology

## 2015-05-31 ENCOUNTER — Encounter (HOSPITAL_COMMUNITY)
Admission: RE | Disposition: A | Discharge status: Home / Self Care | Payer: Self-pay | Source: Ambulatory Visit | Attending: Cardiology

## 2015-05-31 ENCOUNTER — Encounter (HOSPITAL_COMMUNITY): Payer: Self-pay | Admitting: Cardiology

## 2015-05-31 DIAGNOSIS — K7460 Unspecified cirrhosis of liver: Secondary | ICD-10-CM | POA: Insufficient documentation

## 2015-05-31 DIAGNOSIS — R0602 Shortness of breath: Secondary | ICD-10-CM | POA: Diagnosis not present

## 2015-05-31 DIAGNOSIS — J45909 Unspecified asthma, uncomplicated: Secondary | ICD-10-CM | POA: Diagnosis not present

## 2015-05-31 DIAGNOSIS — I272 Other secondary pulmonary hypertension: Secondary | ICD-10-CM | POA: Diagnosis present

## 2015-05-31 DIAGNOSIS — I10 Essential (primary) hypertension: Secondary | ICD-10-CM | POA: Insufficient documentation

## 2015-05-31 DIAGNOSIS — J9610 Chronic respiratory failure, unspecified whether with hypoxia or hypercapnia: Secondary | ICD-10-CM | POA: Insufficient documentation

## 2015-05-31 DIAGNOSIS — D696 Thrombocytopenia, unspecified: Secondary | ICD-10-CM | POA: Diagnosis not present

## 2015-05-31 DIAGNOSIS — R161 Splenomegaly, not elsewhere classified: Secondary | ICD-10-CM | POA: Insufficient documentation

## 2015-05-31 DIAGNOSIS — Z7952 Long term (current) use of systemic steroids: Secondary | ICD-10-CM | POA: Diagnosis not present

## 2015-05-31 DIAGNOSIS — Z9981 Dependence on supplemental oxygen: Secondary | ICD-10-CM | POA: Diagnosis not present

## 2015-05-31 HISTORY — PX: CARDIAC CATHETERIZATION: SHX172

## 2015-05-31 LAB — CBC
HCT: 45.1 % (ref 39.0–52.0)
HEMOGLOBIN: 16.4 g/dL (ref 13.0–17.0)
MCH: 39.3 pg — AB (ref 26.0–34.0)
MCHC: 36.4 g/dL — ABNORMAL HIGH (ref 30.0–36.0)
MCV: 108.2 fL — ABNORMAL HIGH (ref 78.0–100.0)
Platelets: 62 10*3/uL — ABNORMAL LOW (ref 150–400)
RBC: 4.17 MIL/uL — AB (ref 4.22–5.81)
RDW: 14.5 % (ref 11.5–15.5)
WBC: 4.1 10*3/uL (ref 4.0–10.5)

## 2015-05-31 LAB — BASIC METABOLIC PANEL
ANION GAP: 9 (ref 5–15)
BUN: 8 mg/dL (ref 6–20)
CALCIUM: 8.6 mg/dL — AB (ref 8.9–10.3)
CO2: 20 mmol/L — AB (ref 22–32)
Chloride: 112 mmol/L — ABNORMAL HIGH (ref 101–111)
Creatinine, Ser: 0.65 mg/dL (ref 0.61–1.24)
Glucose, Bld: 85 mg/dL (ref 65–99)
Potassium: 3.9 mmol/L (ref 3.5–5.1)
SODIUM: 141 mmol/L (ref 135–145)

## 2015-05-31 LAB — PROTIME-INR
INR: 1.64 — AB (ref 0.00–1.49)
PROTHROMBIN TIME: 19.5 s — AB (ref 11.6–15.2)

## 2015-05-31 SURGERY — RIGHT HEART CATH

## 2015-05-31 MED ORDER — LIDOCAINE HCL (PF) 1 % IJ SOLN
INTRAMUSCULAR | Status: DC | PRN
  Administered 2015-05-31: 2 mL via INTRADERMAL

## 2015-05-31 MED ORDER — ASPIRIN 81 MG PO CHEW
81.0000 mg | CHEWABLE_TABLET | ORAL | Status: AC
  Administered 2015-05-31: 81 mg via ORAL

## 2015-05-31 MED ORDER — HEPARIN (PORCINE) IN NACL 2-0.9 UNIT/ML-% IJ SOLN
INTRAMUSCULAR | Status: DC | PRN
  Administered 2015-05-31: 10:00:00

## 2015-05-31 MED ORDER — SODIUM CHLORIDE 0.9 % IV SOLN
INTRAVENOUS | Status: DC
  Administered 2015-05-31: 08:00:00 via INTRAVENOUS

## 2015-05-31 MED ORDER — FENTANYL CITRATE (PF) 100 MCG/2ML IJ SOLN
INTRAMUSCULAR | Status: DC | PRN
  Administered 2015-05-31: 25 ug via INTRAVENOUS

## 2015-05-31 MED ORDER — SODIUM CHLORIDE 0.9 % IV SOLN: 250.0000 mL | INTRAVENOUS | Status: DC | PRN

## 2015-05-31 MED ORDER — SODIUM CHLORIDE 0.9% FLUSH: 3.0000 mL | Freq: Two times a day (BID) | INTRAVENOUS | Status: DC

## 2015-05-31 MED ORDER — FENTANYL CITRATE (PF) 100 MCG/2ML IJ SOLN
INTRAMUSCULAR | Status: AC
  Filled 2015-05-31: qty 2

## 2015-05-31 MED ORDER — MIDAZOLAM HCL 2 MG/2ML IJ SOLN
INTRAMUSCULAR | Status: DC | PRN
  Administered 2015-05-31: 1 mg via INTRAVENOUS

## 2015-05-31 MED ORDER — HEPARIN (PORCINE) IN NACL 2-0.9 UNIT/ML-% IJ SOLN
INTRAMUSCULAR | Status: AC
  Filled 2015-05-31: qty 1000

## 2015-05-31 MED ORDER — LIDOCAINE HCL (PF) 1 % IJ SOLN
INTRAMUSCULAR | Status: AC
  Filled 2015-05-31: qty 30

## 2015-05-31 MED ORDER — MIDAZOLAM HCL 2 MG/2ML IJ SOLN
INTRAMUSCULAR | Status: AC
  Filled 2015-05-31: qty 2

## 2015-05-31 MED ORDER — SODIUM CHLORIDE 0.9% FLUSH: 3.0000 mL | INTRAVENOUS | Status: DC | PRN

## 2015-05-31 MED ORDER — ASPIRIN 81 MG PO CHEW
CHEWABLE_TABLET | ORAL | Status: AC
  Administered 2015-05-31: 81 mg via ORAL
  Filled 2015-05-31: qty 1

## 2015-05-31 SURGICAL SUPPLY — 5 items
CATH BALLN WEDGE 5F 110CM (CATHETERS) ×3 IMPLANT
KIT HEART LEFT (KITS) ×3 IMPLANT
PACK CARDIAC CATHETERIZATION (CUSTOM PROCEDURE TRAY) ×3 IMPLANT
SHEATH FAST CATH BRACH 5F 5CM (SHEATH) ×3 IMPLANT
TRANSDUCER W/STOPCOCK (MISCELLANEOUS) ×3 IMPLANT

## 2015-05-31 NOTE — Interval H&P Note (Signed)
History and Physical Interval Note:  05/31/2015 9:20 AM  Roy Case  has presented today for surgery, with the diagnosis of hp  The various methods of treatment have been discussed with the patient and family. After consideration of risks, benefits and other options for treatment, the patient has consented to  Procedure(s): Right Heart Cath (N/A) as a surgical intervention .  The patient's history has been reviewed, patient examined, no change in status, stable for surgery.  I have reviewed the patient's chart and labs.  Questions were answered to the patient's satisfaction.     [name] [employer]

## 2015-05-31 NOTE — Progress Notes (Signed)
This note also relates to the following rows which could not be included: Pulse Rate - Cannot attach notes to unvalidated device data BP - Cannot attach notes to unvalidated device data SpO2 - Cannot attach notes to unvalidated device data   No O2 worn while ambulating

## 2015-05-31 NOTE — Discharge Instructions (Signed)
Angiogram, Care After Refer to this sheet in the next few weeks. These instructions provide you with information about caring for yourself after your procedure. Your health care provider may also give you more specific instructions. Your treatment has been planned according to current medical practices, but problems sometimes occur. Call your health care provider if you have any problems or questions after your procedure. WHAT TO EXPECT AFTER THE PROCEDURE After your procedure, it is typical to have the following:  Bruising at the catheter insertion site that usually fades within 1-2 weeks.  Blood collecting in the tissue (hematoma) that may be painful to the touch. It should usually decrease in size and tenderness within 1-2 weeks. HOME CARE INSTRUCTIONS  Take medicines only as directed by your health care provider.  You may shower 24 hours after the procedure or as directed by your health care provider. Remove the bandage (dressing) and gently wash the site with plain soap and water. Pat the area dry with a clean towel. Do not rub the site, because this may cause bleeding.  Do not take baths, swim, or use a hot tub until your health care provider approves. 3 days  Check your insertion site every day for redness, swelling, or drainage.  Do not apply powder or lotion to the site.  Do not lift over 10 lb (4.5 kg) for 5 days after your procedure or as directed by your health care provider.  Ask your health care provider when it is okay to:  Return to work or school.  Resume usual physical activities or sports.  Resume sexual activity.  Do not drive home if you are discharged the same day as the procedure. Have someone else drive you.  You may drive 24 hours after the procedure unless otherwise instructed by your health care provider.  Do not operate machinery or power tools for 24 hours after the procedure or as directed by your health care provider.  If your procedure was done as  an outpatient procedure, which means that you went home the same day as your procedure, a responsible adult should be with you for the first 24 hours after you arrive home.  Keep all follow-up visits as directed by your health care provider. This is important. SEEK MEDICAL CARE IF:  You have a fever.  You have chills.  You have increased bleeding from the catheter insertion site. Hold pressure on the site. SEEK IMMEDIATE MEDICAL CARE IF:  You have unusual pain at the catheter insertion site.  You have redness, warmth, or swelling at the catheter insertion site.  You have drainage (other than a small amount of blood on the dressing) from the catheter insertion site.  The catheter insertion site is bleeding, and the bleeding does not stop after 30 minutes of holding steady pressure on the site.  The area near or just beyond the catheter insertion site becomes pale, cool, tingly, or numb.   This information is not intended to replace advice given to you by your health care provider. Make sure you discuss any questions you have with your health care provider.   Document Released: 09/19/2004 Document Revised: 03/24/2014 Document Reviewed: 08/04/2012 Elsevier Interactive Patient Education [employer] Elsevier [employer]

## 2015-05-31 NOTE — H&P (View-Only) (Signed)
Patient ID: Volney Presserracey L Ausborn, male   DOB: 09-Sep-1965, 50 y.o.   MRN: [id number] PCP: Carver Filaalone [facility]: [facility]: [facility]  50 yo with cirrhosis and chronic hypoxemic respiratory failure and positive bubble study presents for cardiology evaluation.  He has been on home oxygen since 7/14.  He was labeled as COPD but never smoked.  Alpha-1 antitrypsin testing was negative.  PFTs in 1/17 showed very mild obstruction but severely decreased DLCO.  Echo in 2/17 showed normal LV size and systolic function and normal RV, but there was a positive bubble study (could not identify PFO).  CTA chest in 7/15 showed minimal parenchymal disease and no PE.  He has been noted to have cirrhosis with splenomegaly and low platelets.  Never a heavy drinker.  Iron stores were very high raising concern for hemochromatosis.    Currently, he does well getting around the house.  He is short of breath with steps and short of breath walking more than 1 block on flat ground.  No orthopnea/ PND.  No chest pain.   Labs (3/17): K 4.2, creatinine 0.7, AST 68, ALT 49, tbili 3.2, ferritin 775, 100% transferrin saturation, HCT 39, plts 42. Hemochromatosis genetic testing negative.   PMH: 1. HTN 2. Asthma 3. Cirrhosis: ?NASH.  Also considering hemochromatosis.  4. Chronic hypoxemic respiratory failure:  On home oxygen.  - CTA chest (7/15) with no PE and minimal parenchymal disease. - PFTs (1/17) with mild obstruction, severely decreased DLCO.  - Echo (2/17) with EF 55-60%, RV normal size and systolic function, unable to identify PFO but bubble study positive.  - alpha-1 antitrypsin negative.  5. Splenomegaly with thrombocytopenia. 6. Elevated ferritin: ?hemochromatosis. Genetic testing for hemochromatosis negative.   SH: Nonsmoker, no ETOH, [place]  Family History  Problem Relation Age of Onset  . Lung cancer Mother   . Colon cancer Neg Hx   . Esophageal cancer Neg Hx   . Pancreatic cancer Neg Hx   . Kidney  disease Neg Hx   . Liver disease Neg Hx    ROS: All systems reviewed and negative except as per HPI.   Current Outpatient Prescriptions  Medication Sig Dispense Refill  . albuterol (PROVENTIL HFA;VENTOLIN HFA) 108 (90 Base) MCG/ACT inhaler Inhale 2 puffs into the lungs every 6 (six) hours as needed for wheezing or shortness of breath.    Marland Kitchen. albuterol (PROVENTIL) (2.5 MG/3ML) 0.083% nebulizer solution Take 3 mLs (2.5 mg total) by nebulization every 4 (four) hours as needed for wheezing or shortness of breath. 75 mL 2  . ALPRAZolam (XANAX) 0.5 MG tablet Take 0.5 mg by mouth at bedtime as needed for anxiety.    . fluticasone furoate-vilanterol (BREO ELLIPTA) 100-25 MCG/INH AEPB Inhale 1 puff into the lungs daily. Reported on 05/16/2015 28 each 1  . OXYGEN Inhale 4 L into the lungs continuous.     . predniSONE (DELTASONE) 20 MG tablet Take 1 tablet (20 mg total) by mouth 2 (two) times daily with a meal. 20 tablet 0  . propranolol (INDERAL) 40 MG tablet Take 40 mg by mouth 2 (two) times daily.    . sertraline (ZOLOFT) 25 MG tablet Take 25 mg by mouth daily.     No current facility-administered medications for this encounter.   BP 140/81 mmHg  Pulse 65  Resp 20  Wt 172 lb (78.019 kg)  SpO2 90% General: NAD Neck: No JVD, no thyromegaly or thyroid nodule.  Lungs: Clear to auscultation bilaterally with normal respiratory effort. CV:  Nondisplaced PMI.  Heart regular S1/S2, no S3/S4, no murmur.  No peripheral edema.  No carotid bruit.  Normal pedal pulses.  Abdomen: Soft, nontender, no hepatosplenomegaly, no distention.  Skin: Intact without lesions or rashes.  Neurologic: Alert and oriented x 3.  Psych: Normal affect. Extremities: No clubbing or cyanosis.  HEENT: Normal.   Assessment/Plan: 1. Cirrhosis: With splenomegaly and low platelets.  Never a heavy drinker, workup for viral hepatitis negative. His iron stores are very elevated with ferritin 775, however genetic workup for hemochromatosis  was negative.  Cannot, however, rule out a rare mutation.  - Ongoing workup with hematology regarding hemochromatosis.   2. Chronic hypoxemic respiratory failure: Cause of hypoxemia is uncertain.  PFTs showed only mild obstruction with very decreased DLCO suggesting pulmonary vascular disease.  CTA chest in 7/15 showed no PE and minimal pulmonary parenchymal disease.  Echo did not show markedly abnormal RV and had positive bubble study but PFO not visualized. I am concerned for hepatopulmonary syndrome with or without porto-pulmonary hypertension.  Hepatopulmonary syndrome would explain the positive bubble study without PFO visualized and could cause the hypoxemia.  I do think that we need to work him up for pulmonary hypertension.  - I will arrange RHC to assess for pulmonary hypertension (most likely would be porto-pulmonary hypertension).  Platelets are low, will use brachial access if possible.  I discussed risks/benefits of the procedure with the patient and he agrees to proceed. He will need a CBC for plts on the day of cath.   Marca Ancona 05/22/2015

## 2015-06-01 LAB — POCT I-STAT 3, VENOUS BLOOD GAS (G3P V)
ACID-BASE DEFICIT: 3 mmol/L — AB (ref 0.0–2.0)
Bicarbonate: 20 mEq/L (ref 20.0–24.0)
O2 Saturation: 76 %
PH VEN: 7.43 — AB (ref 7.250–7.300)
TCO2: 21 mmol/L (ref 0–100)
pCO2, Ven: 30.1 mmHg — ABNORMAL LOW (ref 45.0–50.0)
pO2, Ven: 39 mmHg (ref 31.0–45.0)

## 2015-06-01 NOTE — Telephone Encounter (Signed)
Open in error

## 2015-06-02 ENCOUNTER — Ambulatory Visit (HOSPITAL_BASED_OUTPATIENT_CLINIC_OR_DEPARTMENT_OTHER): Payer: BLUE CROSS/BLUE SHIELD

## 2015-06-04 ENCOUNTER — Other Ambulatory Visit: Payer: Self-pay | Admitting: Hematology & Oncology

## 2015-06-04 ENCOUNTER — Encounter: Payer: Self-pay | Admitting: Hematology & Oncology

## 2015-06-04 ENCOUNTER — Ambulatory Visit (HOSPITAL_COMMUNITY): Admission: RE | Admit: 2015-06-04 | Payer: BLUE CROSS/BLUE SHIELD | Source: Ambulatory Visit

## 2015-06-04 HISTORY — DX: Hemochromatosis, unspecified: E83.119

## 2015-06-05 ENCOUNTER — Other Ambulatory Visit (HOSPITAL_COMMUNITY): Payer: Self-pay

## 2015-06-05 MED ORDER — PROPRANOLOL HCL 40 MG PO TABS: 40.0000 mg | ORAL_TABLET | Freq: Two times a day (BID) | ORAL | Status: DC

## 2015-06-06 ENCOUNTER — Encounter: Payer: Self-pay | Admitting: Family

## 2015-06-06 ENCOUNTER — Other Ambulatory Visit (HOSPITAL_BASED_OUTPATIENT_CLINIC_OR_DEPARTMENT_OTHER): Payer: BLUE CROSS/BLUE SHIELD

## 2015-06-06 ENCOUNTER — Ambulatory Visit (HOSPITAL_BASED_OUTPATIENT_CLINIC_OR_DEPARTMENT_OTHER): Payer: BLUE CROSS/BLUE SHIELD | Admitting: Hematology & Oncology

## 2015-06-06 ENCOUNTER — Telehealth: Payer: Self-pay | Admitting: Pulmonary Disease

## 2015-06-06 ENCOUNTER — Ambulatory Visit (HOSPITAL_BASED_OUTPATIENT_CLINIC_OR_DEPARTMENT_OTHER): Payer: BLUE CROSS/BLUE SHIELD

## 2015-06-06 DIAGNOSIS — K769 Liver disease, unspecified: Secondary | ICD-10-CM | POA: Diagnosis not present

## 2015-06-06 DIAGNOSIS — K7460 Unspecified cirrhosis of liver: Secondary | ICD-10-CM

## 2015-06-06 DIAGNOSIS — D6959 Other secondary thrombocytopenia: Secondary | ICD-10-CM | POA: Diagnosis not present

## 2015-06-06 LAB — CBC WITH DIFFERENTIAL (CANCER CENTER ONLY)
BASO#: 0 10*3/uL (ref 0.0–0.2)
BASO%: 0.6 % (ref 0.0–2.0)
EOS ABS: 0.1 10*3/uL (ref 0.0–0.5)
EOS%: 2.7 % (ref 0.0–7.0)
HCT: 45.6 % (ref 38.7–49.9)
HEMOGLOBIN: 16.6 g/dL (ref 13.0–17.1)
LYMPH#: 0.7 10*3/uL — ABNORMAL LOW (ref 0.9–3.3)
LYMPH%: 20.1 % (ref 14.0–48.0)
MCH: 39.3 pg — AB (ref 28.0–33.4)
MCHC: 36.4 g/dL — ABNORMAL HIGH (ref 32.0–35.9)
MCV: 108 fL — AB (ref 82–98)
MONO#: 0.5 10*3/uL (ref 0.1–0.9)
MONO%: 16.2 % — AB (ref 0.0–13.0)
NEUT%: 60.4 % (ref 40.0–80.0)
NEUTROS ABS: 2 10*3/uL (ref 1.5–6.5)
Platelets: 58 10*3/uL — ABNORMAL LOW (ref 145–400)
RBC: 4.22 10*6/uL (ref 4.20–5.70)
RDW: 13.9 % (ref 11.1–15.7)
WBC: 3.3 10*3/uL — AB (ref 4.0–10.0)

## 2015-06-06 MED ORDER — ALBUTEROL SULFATE HFA 108 (90 BASE) MCG/ACT IN AERS: 2.0000 | INHALATION_SPRAY | Freq: Four times a day (QID) | RESPIRATORY_TRACT | Status: AC | PRN

## 2015-06-06 NOTE — Telephone Encounter (Signed)
Called spoke with pt. Needs refill on albuterol inhaler. I have sent this into the pharmacy. Nothing further needed

## 2015-06-06 NOTE — Patient Instructions (Signed)

## 2015-06-06 NOTE — Progress Notes (Signed)
Hematology and Oncology Follow Up Visit  Roy Case [id number] [birth year] 50 y.o. 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 We did go ahead and do a bone marrow biopsy on him. As worried that he may have some underlying bone marrow disorder that might be triggering the iron overload. Thankfully, the pathology report ([id number]) shows basically a normal Center marrow. There may be some slight erythroid hyperplasia. No obvious myelodysplastic changes were noted.   His iron studies showed a ferritin of 775 with a greater than 100% saturation.   He did have a right heart catheterization about a month ago. This was relatively unrevealing. He did have a echocardiogram done back in February. He had an excellent ejection fraction of 55-60%.   He still is wearing supplemental oxygen.   He has a normal hemoglobin electrophoresis.   Overall, his performance status is ECOG 1.   Medications:  Current outpatient prescriptions:  .  acetaminophen (TYLENOL) 500 MG tablet, Take 1,000 mg by mouth every 6 (six) hours as needed for moderate pain or headache., Disp: , Rfl:  .  albuterol (PROVENTIL HFA;VENTOLIN HFA) 108 (90 Base) MCG/ACT inhaler, Inhale 2 puffs into the lungs every 6 (six) hours as needed for wheezing or shortness of breath., Disp: , Rfl:  .  albuterol (PROVENTIL) (2.5 MG/3ML) 0.083% nebulizer solution, Take 3 mLs (2.5 mg total) by nebulization every 4 (four) hours as needed for wheezing or shortness of breath., Disp: 75 mL, Rfl: 2 .  ALPRAZolam (XANAX) 0.5 MG tablet, Take 0.5 mg by mouth 2 (two) times daily as needed for anxiety. , Disp: , Rfl:  .  fluticasone furoate-vilanterol (BREO ELLIPTA) 100-25 MCG/INH AEPB, Inhale 1 puff into the lungs daily. Reported on 05/16/2015, Disp: 28 each,  Rfl: 1 .  OXYGEN, Inhale 4 L into the lungs continuous. , Disp: , Rfl:  .  propranolol (INDERAL) 40 MG tablet, Take 1 tablet (40 mg total) by mouth 2 (two) times daily., Disp: 180 tablet, Rfl: 3 .  sertraline (ZOLOFT) 25 MG tablet, Take 25 mg by mouth daily., Disp: , Rfl:   Allergies:  Allergies  Allergen Reactions  . Penicillins Anaphylaxis and Other (See Comments)    Age 50  . Doxycycline Rash    Past Medical History, Surgical history, Social history, and Family History were reviewed and updated.  Review of Systems: As above  Physical Exam:  height is '5\' 9"'  (1.753 m) and weight is 173 lb (78.472 kg). His oral temperature is 98.3 F (36.8 C). His blood pressure is 137/66 and his pulse is 81. His respiration is 18.   Wt Readings from Last 3 Encounters:  06/06/15 173 lb (78.472 kg)  05/31/15 173 lb (78.472 kg)  05/22/15 172 lb (78.019 kg)     Head and neck exam shows no scleral icterus. He has good extra ocular muscle motion. No oral lesions are noted. No adenopathy noted in the neck. Lungs are clear. Cardiac exam regular rate and rhythm with no murmurs, rubs or bruits. Abdomen is soft. Has good bowel sounds. There is no fluid wave. There is no guarding or rebound tenderness. His spleen tip might be palpable deep inspiration. I really cannot palpate his liver. Back exam shows no tenderness over the spine, ribs or hips. Extremity shows no clubbing, cyanosis or edema.  Has decent range of motion of his joints. Neurological exam shows no focal neurological deficits. Skin exam shows no rashes, ecchymoses or petechia.   Lab Results  Component Value Date   WBC 4.1 05/31/2015   HGB 16.4 05/31/2015   HCT 45.1 05/31/2015   MCV 108.2* 05/31/2015   PLT 62* 05/31/2015     Chemistry      Component Value Date/Time   NA 141 05/31/2015 0719   NA 141 05/18/2015 1024   K 3.9 05/31/2015 0719   K 4.2 05/18/2015 1024   CL 112* 05/31/2015 0719   CL 108 05/18/2015 1024   CO2 20* 05/31/2015 0719    CO2 26 05/18/2015 1024   BUN 8 05/31/2015 0719   BUN 10 05/18/2015 1024   CREATININE 0.65 05/31/2015 0719   CREATININE 0.7 05/18/2015 1024      Component Value Date/Time   CALCIUM 8.6* 05/31/2015 0719   CALCIUM 8.8 05/18/2015 1024   ALKPHOS 89* 05/18/2015 1024   ALKPHOS 83 04/04/2015 1057   AST 68* 05/18/2015 1024   AST 57* 04/04/2015 1057   ALT 49* 05/18/2015 1024   ALT 36 04/04/2015 1057   BILITOT 3.20* 05/18/2015 1024   BILITOT 2.7* 04/04/2015 1057         Impression and Plan: Roy Case is A 50 year old white male. He has iron overload. 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 He says that there are 4 cousins who are dealing with too much iron.  I think we have to get him on a phlebotomy program. I think this will definitely get his iron down. Recently have a lot of flexibility given his hemoglobin was 16.4.  I think with his iron getting down, his platelet count should improve.  There has not been anything else that I can think of that we can do at this point.  I will phlebotomize him weekly now. I will try for 4 weeks of phlebotomies. I will then see him back afterwards.  I spent about 40 minutes with him today. I went over his bone marrow report. I went over his labs. He has a good understanding of what is going on.     Volanda Napoleon, MD 3/22/20179:41 AM

## 2015-06-06 NOTE — Progress Notes (Signed)
Roy Case L Wafer presents today for phlebotomy per MD orders. Phlebotomy procedure started at 1020 and ended at 1030. 500 grams removed. Patient observed for 30 minutes after procedure without any incident. Patient tolerated procedure well. IV needle removed intact.

## 2015-06-07 ENCOUNTER — Telehealth: Payer: Self-pay | Admitting: *Deleted

## 2015-06-07 ENCOUNTER — Encounter (HOSPITAL_COMMUNITY): Payer: Self-pay | Admitting: *Deleted

## 2015-06-07 NOTE — Progress Notes (Signed)
Spoke with dr Desmond Lopeturk and made aware abnormal chest xray resuls from 05-16-15, anesthesia will assess pt am of procedure 06-12-15 to see if pt needs follow up chest xray.

## 2015-06-07 NOTE — Telephone Encounter (Signed)
Patient c/o SOB yesterday afternoon. He wants to know if this could be related to his phlebotomy. His SOB is better today. Of note, he has asthma and he says that he didn't have his inhaler with him, but picked up a new dose last evening and has been using it as prescribed.  Spoke to Dr Myna HidalgoEnnever and he doesn't believe SOB is related to the phlebotomy. Patient told to keep well hydrated. To use his inhaler and home oxygen as needed and to call the office if he experiences any further trouble.

## 2015-06-09 ENCOUNTER — Ambulatory Visit (HOSPITAL_BASED_OUTPATIENT_CLINIC_OR_DEPARTMENT_OTHER): Payer: BLUE CROSS/BLUE SHIELD

## 2015-06-11 ENCOUNTER — Telehealth: Payer: Self-pay | Admitting: Internal Medicine

## 2015-06-11 LAB — CHROMOSOME ANALYSIS, BONE MARROW

## 2015-06-11 LAB — TISSUE HYBRIDIZATION (BONE MARROW)-NCBH

## 2015-06-11 NOTE — Telephone Encounter (Signed)
Pts colonoscopy rescheduled to 08/28/15@8 :30am. Left message for pt to call back.   Spoke with pt and he is aware, updated instructions mailed to pt.

## 2015-06-12 ENCOUNTER — Other Ambulatory Visit: Payer: Self-pay | Admitting: *Deleted

## 2015-06-13 ENCOUNTER — Other Ambulatory Visit (HOSPITAL_BASED_OUTPATIENT_CLINIC_OR_DEPARTMENT_OTHER): Payer: BLUE CROSS/BLUE SHIELD

## 2015-06-13 ENCOUNTER — Ambulatory Visit (HOSPITAL_BASED_OUTPATIENT_CLINIC_OR_DEPARTMENT_OTHER): Payer: BLUE CROSS/BLUE SHIELD

## 2015-06-13 LAB — CBC WITH DIFFERENTIAL (CANCER CENTER ONLY)
BASO#: 0 10*3/uL (ref 0.0–0.2)
BASO%: 0.8 % (ref 0.0–2.0)
EOS ABS: 0.2 10*3/uL (ref 0.0–0.5)
EOS%: 5.1 % (ref 0.0–7.0)
HEMATOCRIT: 44.2 % (ref 38.7–49.9)
HEMOGLOBIN: 16 g/dL (ref 13.0–17.1)
LYMPH#: 0.7 10*3/uL — AB (ref 0.9–3.3)
LYMPH%: 19.2 % (ref 14.0–48.0)
MCH: 39 pg — AB (ref 28.0–33.4)
MCHC: 36.2 g/dL — AB (ref 32.0–35.9)
MCV: 108 fL — AB (ref 82–98)
MONO#: 0.4 10*3/uL (ref 0.1–0.9)
MONO%: 12.4 % (ref 0.0–13.0)
NEUT%: 62.5 % (ref 40.0–80.0)
NEUTROS ABS: 2.2 10*3/uL (ref 1.5–6.5)
Platelets: 64 10*3/uL — ABNORMAL LOW (ref 145–400)
RBC: 4.1 10*6/uL — ABNORMAL LOW (ref 4.20–5.70)
RDW: 14.5 % (ref 11.1–15.7)
WBC: 3.5 10*3/uL — ABNORMAL LOW (ref 4.0–10.0)

## 2015-06-13 NOTE — Patient Instructions (Signed)
Therapeutic Phlebotomy, Care After  Refer to this sheet in the next few weeks. These instructions provide you with information about caring for yourself after your procedure. Your health care provider may also give you more specific instructions. Your treatment has been planned according to current medical practices, but problems sometimes occur. Call your health care provider if you have any problems or questions after your procedure.  WHAT TO EXPECT AFTER THE PROCEDURE  After your procedure, it is common to have:   Light-headedness or dizziness. You may feel faint.   Nausea.   Tiredness.  HOME CARE INSTRUCTIONS  Activities   Return to your normal activities as directed by your health care provider. Most people can go back to their normal activities right away.   Avoid strenuous physical activity and heavy lifting or pulling for about 5 hours after the procedure. Do not lift anything that is heavier than 10 lb (4.5 kg).   Athletes should avoid strenuous exercise for at least 12 hours.   Change positions slowly for the remainder of the day. This will help to prevent light-headedness or fainting.   If you feel light-headed, lie down until the feeling goes away.  Eating and Drinking   Be sure to eat well-balanced meals for the next 24 hours.   Drink enough fluid to keep your urine clear or pale yellow.   Avoid drinking alcohol on the day that you had the procedure.  Care of the Needle Insertion Site   Keep your bandage dry. You can remove the bandage after about 5 hours or as directed by your health care provider.   If you have bleeding from the needle insertion site, elevate your arm and press firmly on the site until the bleeding stops.   If you have bruising at the site, apply ice to the area:   Put ice in a plastic bag.   Place a towel between your skin and the bag.   Leave the ice on for 20 minutes, 2-3 times a day for the first 24 hours.   If the swelling does not go away after 24 hours, apply  a warm, moist washcloth to the area for 20 minutes, 2-3 times a day.  General Instructions   Avoid smoking for at least 30 minutes after the procedure.   Keep all follow-up visits as directed by your health care provider. It is important to continue with further therapeutic phlebotomy treatments as directed.  SEEK MEDICAL CARE IF:   You have redness, swelling, or pain at the needle insertion site.   You have fluid, blood, or pus coming from the needle insertion site.   You feel light-headed, dizzy, or nauseated, and the feeling does not go away.   You notice new bruising at the needle insertion site.   You feel weaker than normal.   You have a fever or chills.  SEEK IMMEDIATE MEDICAL CARE IF:   You have severe nausea or vomiting.   You have chest pain.   You have trouble breathing.    This information is not intended to replace advice given to you by your health care provider. Make sure you discuss any questions you have with your health care provider.    Document Released: 08/05/2010 Document Revised: 07/18/2014 Document Reviewed: 02/27/2014  Elsevier Interactive Patient Education 2016 [employer]

## 2015-06-13 NOTE — Progress Notes (Signed)
Roy Case presents today for phlebotomy per MD orders. Phlebotomy procedure started at 0943 and ended at 10957. Approximately 500 ml removed. Patient observed for 30 minutes after procedure without any incident. Patient tolerated procedure well. IV needle removed intact.

## 2015-06-14 ENCOUNTER — Ambulatory Visit (INDEPENDENT_AMBULATORY_CARE_PROVIDER_SITE_OTHER): Payer: BLUE CROSS/BLUE SHIELD | Admitting: Pulmonary Disease

## 2015-06-14 ENCOUNTER — Encounter: Payer: Self-pay | Admitting: Pulmonary Disease

## 2015-06-14 VITALS — BP 130/66 | HR 70 | Ht 70.0 in | Wt 171.0 lb

## 2015-06-14 DIAGNOSIS — K7460 Unspecified cirrhosis of liver: Secondary | ICD-10-CM | POA: Diagnosis not present

## 2015-06-14 DIAGNOSIS — J9611 Chronic respiratory failure with hypoxia: Secondary | ICD-10-CM

## 2015-06-14 DIAGNOSIS — I280 Arteriovenous fistula of pulmonary vessels: Secondary | ICD-10-CM | POA: Diagnosis not present

## 2015-06-14 NOTE — Patient Instructions (Signed)
Keep her appointment with cardiology Keep using oxygen as your doing We will see you back in 2 months

## 2015-06-14 NOTE — Assessment & Plan Note (Signed)
See above

## 2015-06-14 NOTE — Progress Notes (Signed)
Subjective:    Patient ID: Roy Case, male    DOB: Oct 17, 1965, 50 y.o.   MRN: [id number]   Synopsis: Roy Case was referred for hypoxemic respiratory failure in 2016. He has been labeled as having COPD despite having a normal lung exam and never smoking cigarettes.   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 Nodular, cirrhotic liver with a 1.5 cm mildly hyperdense mass in the left hepatic lobe. July 2015 simple spirometry: Ratio 74%, FEV1 2.53 L (60% predicted), FVC 3.43 L (64% predicted) February 2016 echocardiogram: LVEF 65-70%, right ventricle is normal in size and function, right atrial normal in size and function. Mild TR, normal PA pressure estimate 03/2015 PFT> pre bronchodilator ratio 69%, FEV1 2.50 (65% pred, improved to 74% predicted with no airflow obstruction) post bronchodilator.  The TLC 5/75 (85% pred), no air trapping, the DLCO was markedly abnormal at 9.40 (30% pred) with a recent Hgb of 17.6gm/dL! February 2017 echocardiogram showed evidence of a right-to-left shunt, unclear if this occurred due to a atrial septal defect. RHC 04/2015 : Right Heart Pressures Procedural Findings:  Hemodynamics (mmHg)RA mean 3  RV 18/1PA 25/7  PCWP mean 8 Oxygen saturations:PA 76% AO 91% Cardiac Output (Fick) 7.67  Cardiac Index (Fick) 3.97  HPI  Chief Complaint  Patient presents with  . Follow-up    pt c/o worsening fatigue, sob with any exertion, nonprod cough.     Roy Case says that he has been diagnosed with iron overload and he has been told that this needs to be treated with Iron Overload.  He says that he had an uncle who had problems with Iron Overload.  He has a cousin apparently who has Iron overload as well.   He also says that he has several other family members with hemochromatosis as well. Five family members  have some sort of iron overload syndrome.  He is getting over the pneumonia, but he still has some dyspnea from time to time.   He is having a lot of allergies from the pollen.  He continues to keep his oxygen level at 4L O2 all the time.   Past Medical History  Diagnosis Date  . Hypertension   . Allergic rhinitis   . Asthma   . High cholesterol   . Cirrhosis (HCC)   . 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 History of home oxygen therapy     4 liters all the time       Review of Systems  Constitutional: Negative for fever, chills and fatigue.  HENT: Negative for postnasal drip, sinus pressure and sneezing.   Respiratory: Positive for cough, shortness of breath and wheezing.   Cardiovascular: Positive for leg swelling. Negative for chest pain and palpitations.       Objective:   Physical Exam Filed Vitals:   06/14/15 1123  BP: 130/66  Pulse: 70  Height: 5\' 10"  (1.778 m)  Weight: 171 lb (77.565 kg)  SpO2: 90%   4L [place]  Gen: chronically ill appearing HENT: OP clear, TM's clear, neck supple PULM: CTA B, normal percussion CV: RRR, no mgr, trace ankle edema GI: BS+, soft, nontender Derm: mild diffuse cyanosis, no rash Psyche: normal mood and affect  Cardiology notes reviewed  CBC    Component Value Date/Time   WBC 3.5* 06/13/2015 0851   WBC 4.1 05/31/2015 0719   RBC 4.10*  06/13/2015 0851   RBC 4.17* 05/31/2015 0719   HGB 16.0 06/13/2015 0851   HGB 16.4 05/31/2015 0719   HCT 44.2 06/13/2015 0851   HCT 45.1 05/31/2015 0719   PLT 64* 06/13/2015 0851   PLT 62* 05/31/2015 0719   MCV 108* 06/13/2015 0851   MCV 108.2* 05/31/2015 0719   MCH 39.0* 06/13/2015 0851   MCH 39.3* 05/31/2015 0719   MCHC 36.2* 06/13/2015 0851   MCHC 36.4* 05/31/2015 0719   RDW 14.5 06/13/2015 0851   RDW 14.5 05/31/2015 0719   LYMPHSABS 0.7* 06/13/2015 0851   LYMPHSABS 0.8 04/04/2015 1057   MONOABS 0.5 04/04/2015 1057   EOSABS 0.2 06/13/2015 0851   EOSABS 0.2 04/04/2015  1057   BASOSABS 0.0 06/13/2015 0851   BASOSABS 0.0 04/04/2015 1057         Assessment & Plan:  Chronic respiratory failure with hypoxia (HCC) Roy Case has chronic hypoxemic respiratory failure that has developed since 2014. While he was labeled as having COPD I want to make it abundantly clear that I do not feel that he has this condition. He does have some degree of moderate airflow obstruction but he's never smoked cigarettes and he is never wheezed for me. He has normal-appearing lungs on his CT chest without evidence of pulmonary parenchymal disease. However, he has dramatic hypoxemia with evidence on echocardiogram of a right to left shunt occurring somewhere, unclear if it's cardiac.  I believe the workup for this should proceed as follows. I think we need to sort out if there is a cardiac shunt with a TEE. However, if there is no intracardiac shunting, then that would mean that the right to left shunting is occurring in the pulmonary arterial tree which can occur with individuals with advanced cirrhosis. He can also occur in the setting of hereditary hemorrhagic telangiectasia, but he has no history of GI bleeding, no history of epistaxis, so I think this is very unlikely.  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 It's unclear to me how this fits into this picture other than a potential cause of his cirrhosis.  > 50% of time spent face to face in a 32 minute visit   Current outpatient prescriptions:  .  acetaminophen  (TYLENOL) 500 MG tablet, Take 1,000 mg by mouth every 6 (six) hours as needed for moderate pain or headache., Disp: , Rfl:  .  albuterol (PROVENTIL HFA;VENTOLIN HFA) 108 (90 Base) MCG/ACT inhaler, Inhale 2 puffs into the lungs every 6 (six) hours as needed for wheezing or shortness of breath., Disp: 1 Inhaler, Rfl: 3 .  albuterol (PROVENTIL) (2.5 MG/3ML) 0.083% nebulizer solution, Take 3 mLs (2.5 mg total) by nebulization every 4 (four) hours as needed for wheezing or shortness of breath., Disp: 75 mL, Rfl: 2 .  ALPRAZolam (XANAX) 0.5 MG tablet, Take 0.5 mg by mouth 2 (two) times daily as needed for anxiety. , Disp: , Rfl:  .  fluticasone furoate-vilanterol (BREO ELLIPTA) 100-25 MCG/INH AEPB, Inhale 1 puff into the lungs daily. Reported on 05/16/2015, Disp: 28 each,  Rfl: 1 .  OXYGEN, Inhale 4 L into the lungs continuous. , Disp: , Rfl:  .  propranolol (INDERAL) 40 MG tablet, Take 1 tablet (40 mg total) by mouth 2 (two) times daily., Disp: 180 tablet, Rfl: 3 .  sertraline (ZOLOFT) 25 MG tablet, Take 25 mg by mouth at bedtime. , Disp: , Rfl:

## 2015-06-14 NOTE — Assessment & Plan Note (Signed)
Roy Case has chronic hypoxemic respiratory failure that has developed since 2014. While he was labeled as having COPD I want to make it abundantly clear that I do not feel that he has this condition. He does have some degree of moderate airflow obstruction but he's never smoked cigarettes and he is never wheezed for me. He has normal-appearing lungs on his CT chest without evidence of pulmonary parenchymal disease. However, he has dramatic hypoxemia with evidence on echocardiogram of a right to left shunt occurring somewhere, unclear if it's cardiac.  I believe the workup for this should proceed as follows. I think we need to sort out if there is a cardiac shunt with a TEE. However, if there is no intracardiac shunting, then that would mean that the right to left shunting is occurring in the pulmonary arterial tree which can occur with individuals with advanced cirrhosis. He can also occur in the setting of hereditary hemorrhagic telangiectasia, but he has no history of GI bleeding, no history of epistaxis, so I think this is very unlikely.  Plan summary I would recommend a TEE from cardiology to sort out if there is an intracardiac shunt Continue taking inhalers for now, though I don't think they add much If there is no evidence of an intracardiac shunt, then I will discuss further management of his cirrhosis with GI (should we consider liver transplantation?) Continue O2 as prescribed F/U 2 months

## 2015-06-14 NOTE — Assessment & Plan Note (Signed)
This was recently diagnosed. It's unclear to me how this fits into this picture other than a potential cause of his cirrhosis.

## 2015-06-19 ENCOUNTER — Encounter (HOSPITAL_COMMUNITY): Payer: Self-pay

## 2015-06-20 ENCOUNTER — Other Ambulatory Visit (HOSPITAL_BASED_OUTPATIENT_CLINIC_OR_DEPARTMENT_OTHER): Payer: BLUE CROSS/BLUE SHIELD

## 2015-06-20 ENCOUNTER — Ambulatory Visit (HOSPITAL_BASED_OUTPATIENT_CLINIC_OR_DEPARTMENT_OTHER): Payer: BLUE CROSS/BLUE SHIELD

## 2015-06-20 ENCOUNTER — Encounter (HOSPITAL_COMMUNITY): Payer: BLUE CROSS/BLUE SHIELD

## 2015-06-20 DIAGNOSIS — K7460 Unspecified cirrhosis of liver: Secondary | ICD-10-CM

## 2015-06-20 LAB — CBC WITH DIFFERENTIAL (CANCER CENTER ONLY)
BASO#: 0 10*3/uL (ref 0.0–0.2)
BASO%: 0.7 % (ref 0.0–2.0)
EOS ABS: 0.2 10*3/uL (ref 0.0–0.5)
EOS%: 3.9 % (ref 0.0–7.0)
HCT: 40.8 % (ref 38.7–49.9)
HEMOGLOBIN: 15.5 g/dL (ref 13.0–17.1)
LYMPH#: 0.8 10*3/uL — ABNORMAL LOW (ref 0.9–3.3)
LYMPH%: 16.9 % (ref 14.0–48.0)
MCH: 40.4 pg — AB (ref 28.0–33.4)
MCHC: 38 g/dL — AB (ref 32.0–35.9)
MCV: 106 fL — AB (ref 82–98)
MONO#: 0.7 10*3/uL (ref 0.1–0.9)
MONO%: 14.3 % — AB (ref 0.0–13.0)
NEUT#: 2.9 10*3/uL (ref 1.5–6.5)
NEUT%: 64.2 % (ref 40.0–80.0)
PLATELETS: 67 10*3/uL — AB (ref 145–400)
RBC: 3.84 10*6/uL — ABNORMAL LOW (ref 4.20–5.70)
RDW: 13.6 % (ref 11.1–15.7)
WBC: 4.6 10*3/uL (ref 4.0–10.0)

## 2015-06-20 NOTE — Progress Notes (Signed)
Roy Case presents today for phlebotomy per MD orders. Phlebotomy procedure started at 1050 and ended at 1100. 500 grams removed. Patient observed for 30 minutes after procedure without any incident. Patient tolerated procedure well. IV needle removed intact.

## 2015-06-20 NOTE — Patient Instructions (Signed)

## 2015-06-27 ENCOUNTER — Telehealth: Payer: Self-pay | Admitting: *Deleted

## 2015-06-27 ENCOUNTER — Encounter: Payer: Self-pay | Admitting: Hematology & Oncology

## 2015-06-27 ENCOUNTER — Ambulatory Visit (HOSPITAL_BASED_OUTPATIENT_CLINIC_OR_DEPARTMENT_OTHER): Payer: BLUE CROSS/BLUE SHIELD

## 2015-06-27 ENCOUNTER — Other Ambulatory Visit (HOSPITAL_BASED_OUTPATIENT_CLINIC_OR_DEPARTMENT_OTHER): Payer: BLUE CROSS/BLUE SHIELD

## 2015-06-27 ENCOUNTER — Ambulatory Visit (HOSPITAL_BASED_OUTPATIENT_CLINIC_OR_DEPARTMENT_OTHER): Payer: BLUE CROSS/BLUE SHIELD | Admitting: Hematology & Oncology

## 2015-06-27 ENCOUNTER — Other Ambulatory Visit: Payer: Self-pay | Admitting: *Deleted

## 2015-06-27 DIAGNOSIS — K7460 Unspecified cirrhosis of liver: Secondary | ICD-10-CM

## 2015-06-27 DIAGNOSIS — D6959 Other secondary thrombocytopenia: Secondary | ICD-10-CM

## 2015-06-27 DIAGNOSIS — K769 Liver disease, unspecified: Secondary | ICD-10-CM

## 2015-06-27 DIAGNOSIS — I509 Heart failure, unspecified: Secondary | ICD-10-CM

## 2015-06-27 LAB — CBC WITH DIFFERENTIAL (CANCER CENTER ONLY)
BASO#: 0 10*3/uL (ref 0.0–0.2)
BASO%: 0.6 % (ref 0.0–2.0)
EOS%: 4.3 % (ref 0.0–7.0)
Eosinophils Absolute: 0.2 10*3/uL (ref 0.0–0.5)
HCT: 40.1 % (ref 38.7–49.9)
HGB: 14.8 g/dL (ref 13.0–17.1)
LYMPH#: 0.6 10*3/uL — ABNORMAL LOW (ref 0.9–3.3)
LYMPH%: 17.9 % (ref 14.0–48.0)
MCH: 40.2 pg — ABNORMAL HIGH (ref 28.0–33.4)
MCHC: 36.9 g/dL — ABNORMAL HIGH (ref 32.0–35.9)
MCV: 109 fL — AB (ref 82–98)
MONO#: 0.5 10*3/uL (ref 0.1–0.9)
MONO%: 13.9 % — ABNORMAL HIGH (ref 0.0–13.0)
NEUT#: 2.2 10*3/uL (ref 1.5–6.5)
NEUT%: 63.3 % (ref 40.0–80.0)
PLATELETS: 55 10*3/uL — AB (ref 145–400)
RBC: 3.68 10*6/uL — AB (ref 4.20–5.70)
RDW: 13.9 % (ref 11.1–15.7)
WBC: 3.5 10*3/uL — AB (ref 4.0–10.0)

## 2015-06-27 LAB — COMPREHENSIVE METABOLIC PANEL
ALT: 30 U/L (ref 0–55)
AST: 60 U/L — AB (ref 5–34)
Albumin: 2.8 g/dL — ABNORMAL LOW (ref 3.5–5.0)
Alkaline Phosphatase: 76 U/L (ref 40–150)
Anion Gap: 7 mEq/L (ref 3–11)
BILIRUBIN TOTAL: 3.35 mg/dL — AB (ref 0.20–1.20)
BUN: 8 mg/dL (ref 7.0–26.0)
CHLORIDE: 114 meq/L — AB (ref 98–109)
CO2: 21 meq/L — AB (ref 22–29)
CREATININE: 0.7 mg/dL (ref 0.7–1.3)
Calcium: 8.6 mg/dL (ref 8.4–10.4)
EGFR: >90 mL/min/{1.73_m2} (ref >90)
GLUCOSE: 88 mg/dL (ref 70–140)
Potassium: 4.2 mEq/L (ref 3.5–5.1)
SODIUM: 143 meq/L (ref 136–145)
TOTAL PROTEIN: 6.3 g/dL — AB (ref 6.4–8.3)

## 2015-06-27 LAB — IRON AND TIBC
%SAT: 97 % — AB (ref 20–55)
IRON: 167 ug/dL — AB (ref 42–163)
TIBC: 171 ug/dL — ABNORMAL LOW (ref 202–409)
UIBC: 5 ug/dL — AB (ref 117–376)

## 2015-06-27 LAB — FERRITIN: FERRITIN: 469 ng/mL — AB (ref 22–316)

## 2015-06-27 MED ORDER — FUROSEMIDE 40 MG PO TABS: 40.0000 mg | ORAL_TABLET | Freq: Every day | ORAL | Status: DC

## 2015-06-27 NOTE — Telephone Encounter (Addendum)
Patient aware of results. Appointments all scheduled.   ----- Message from Josph MachoPeter R Ennever, MD sent at 06/27/2015  3:17 PM EDT ----- Call - let him know that the iron level is coming down nicely!!!  We need to keep up the weekly phlebotomy appts!!!  Make sure that he is set up for phlebotony every week for next 3 week.  pete

## 2015-06-27 NOTE — Patient Instructions (Signed)

## 2015-06-27 NOTE — Addendum Note (Signed)
Addended by: Arlan OrganENNEVER, [place] [employer] R on: 06/27/2015 05:50 PM   Modules accepted: Orders

## 2015-06-27 NOTE — Progress Notes (Signed)
Hematology and Oncology Follow Up Visit  Roy Case [id number] [birth year] 49 y.o. 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 He looks okay. He still wears supplemental oxygen. He does have the underlying lung disease.  He's done well with phlebotomies to date. He's had no complications  from the phlebotomies.  He's had no nausea or vomiting. He's had no fever. He's had some leg swelling. I think the leg swelling might be from his underlying cirrhosis. I'll try him on some IV Lasix and we'll see if that helps a little bit.  He has had no joint issues. He's had no joint swelling.   Overall, his performance status is ECOG 1.   Medications:  Current outpatient prescriptions:  .  acetaminophen (TYLENOL) 500 MG tablet, Take 1,000 mg by mouth every 6 (six) hours as needed for moderate pain or headache., Disp: , Rfl:  .  albuterol (PROVENTIL HFA;VENTOLIN HFA) 108 (90 Base) MCG/ACT inhaler, Inhale 2 puffs into the lungs every 6 (six) hours as needed for wheezing or shortness of breath., Disp: 1 Inhaler, Rfl: 3 .  albuterol (PROVENTIL) (2.5 MG/3ML) 0.083% nebulizer solution, Take 3 mLs (2.5 mg total) by nebulization every 4 (four) hours as needed for wheezing or shortness of breath., Disp: 75 mL, Rfl: 2 .  ALPRAZolam (XANAX) 0.5 MG tablet, Take 0.5 mg by mouth 2 (two) times daily as needed for anxiety. , Disp: , Rfl:  .  fluticasone furoate-vilanterol (BREO ELLIPTA) 100-25 MCG/INH AEPB, Inhale 1 puff into the lungs daily. Reported on 05/16/2015, Disp: 28 each, Rfl: 1 .  OXYGEN, Inhale 4 L into the lungs continuous. , Disp: , Rfl:  .  propranolol (INDERAL) 40 MG tablet, Take 1 tablet (40 mg total) by mouth 2 (two) times daily., Disp: 180 tablet, Rfl: 3 .  sertraline (ZOLOFT) 25 MG tablet, Take  25 mg by mouth at bedtime. , Disp: , Rfl:   Allergies:  Allergies  Allergen Reactions  . Penicillins Anaphylaxis and Other (See Comments)    Age 75  . Doxycycline Rash    Past Medical History, Surgical history, Social history, and Family History were reviewed and updated.  Review of Systems: As above  Physical Exam:  height is 5\' 10"  (1.778 m) and weight is 172 lb (78.019 kg). His temperature is 98 F (36.7 C). His blood pressure is 123/72 and his pulse is 59. His respiration is 18 and oxygen saturation is 92%.   Wt Readings from Last 3 Encounters:  06/27/15 172 lb (78.019 kg)  06/14/15 171 lb (77.565 kg)  06/06/15 173 lb (78.472 kg)     Head and neck exam shows no scleral icterus. He has good extra ocular muscle motion. No oral lesions are noted. No adenopathy noted in the neck. Lungs are clear. Cardiac exam regular rate and rhythm with no murmurs, rubs or bruits. Abdomen is soft. Has good bowel sounds. There is no fluid wave. There is no guarding or rebound tenderness. His spleen tip might be palpable deep inspiration. I really cannot palpate his liver. Back exam shows no tenderness over the spine, ribs or hips. Extremity shows no clubbing, cyanosis or edema. Has decent range of motion of his joints. Neurological exam shows no focal neurological deficits. Skin exam shows no rashes, ecchymoses or petechia.   Lab Results  Component Value Date   WBC 3.5* 06/27/2015   HGB 14.8 06/27/2015   HCT 40.1 06/27/2015   MCV 109* 06/27/2015   PLT 55* 06/27/2015     Chemistry      Component Value Date/Time   NA 141 05/31/2015 0719   NA 141 05/18/2015 1024   K 3.9 05/31/2015 0719   K 4.2 05/18/2015 1024   CL 112* 05/31/2015 0719   CL 108 05/18/2015 1024   CO2 20* 05/31/2015 0719   CO2 26 05/18/2015 1024   BUN 8 05/31/2015 0719   BUN 10 05/18/2015 1024   CREATININE 0.65 05/31/2015 0719   CREATININE 0.7 05/18/2015 1024      Component Value Date/Time   CALCIUM 8.6* 05/31/2015 0719     CALCIUM 8.8 05/18/2015 1024   ALKPHOS 89* 05/18/2015 1024   ALKPHOS 83 04/04/2015 1057   AST 68* 05/18/2015 1024   AST 57* 04/04/2015 1057   ALT 49* 05/18/2015 1024   ALT 36 04/04/2015 1057   BILITOT 3.20* 05/18/2015 1024   BILITOT 2.7* 04/04/2015 1057         Impression and Plan: Roy Case is A 50 year old white male. He has iron overload. 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 He says that there are 4 cousins who are dealing with too much iron.  He has had 4 phlebotomies so far. We will see what his iron studies show right now. His hemoglobin is slowly coming down. I would have believe that his iron levels are also improving.  I would also think that with his iron levels coming down, his platelet count should go up.  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 He is asymptomatic with this.  We will go ahead and plague him back in about 3 weeks. We will see what his iron studies and liver tests look like.    Josph Macho, MD 4/12/201711:31 AM

## 2015-06-28 LAB — RETICULOCYTES: Reticulocyte Count: 2.9 % — ABNORMAL HIGH (ref 0.6–2.6)

## 2015-07-04 ENCOUNTER — Other Ambulatory Visit: Payer: BLUE CROSS/BLUE SHIELD

## 2015-07-05 ENCOUNTER — Ambulatory Visit: Payer: BLUE CROSS/BLUE SHIELD | Admitting: Family

## 2015-07-05 ENCOUNTER — Telehealth: Payer: Self-pay | Admitting: Family

## 2015-07-05 DIAGNOSIS — Z0289 Encounter for other administrative examinations: Secondary | ICD-10-CM

## 2015-07-05 NOTE — Telephone Encounter (Signed)
Noted  

## 2015-07-05 NOTE — Telephone Encounter (Signed)
FYI, Pt called not able to make appt today pt is in the hospital. Appt is still on the sch. Thank you!

## 2015-07-10 ENCOUNTER — Telehealth: Payer: Self-pay | Admitting: *Deleted

## 2015-07-10 NOTE — Telephone Encounter (Signed)
Received call nurse states she is at pt home he is c/o of SOB sxs time 2 days. His oxygen is at 85% she states she has listen to his lungs at it seen that his (R) lung is not getting enough air. She advise to call 911 and I agree for pt to go to ER. She stated she will call EMS...Raechel Chute/lmb

## 2015-07-11 ENCOUNTER — Other Ambulatory Visit: Payer: BLUE CROSS/BLUE SHIELD

## 2015-07-11 NOTE — Telephone Encounter (Signed)
Noted  

## 2015-07-12 ENCOUNTER — Inpatient Hospital Stay: Payer: BLUE CROSS/BLUE SHIELD | Admitting: Family

## 2015-07-18 ENCOUNTER — Encounter (HOSPITAL_COMMUNITY): Payer: BLUE CROSS/BLUE SHIELD

## 2015-07-18 ENCOUNTER — Other Ambulatory Visit: Payer: BLUE CROSS/BLUE SHIELD

## 2015-07-18 ENCOUNTER — Ambulatory Visit: Payer: BLUE CROSS/BLUE SHIELD | Admitting: Family

## 2015-07-24 ENCOUNTER — Emergency Department (HOSPITAL_COMMUNITY): Payer: BLUE CROSS/BLUE SHIELD

## 2015-07-24 ENCOUNTER — Encounter (HOSPITAL_COMMUNITY): Payer: Self-pay | Admitting: Emergency Medicine

## 2015-07-24 ENCOUNTER — Emergency Department (HOSPITAL_COMMUNITY)
Admission: EM | Admit: 2015-07-24 | Discharge: 2015-07-25 | Disposition: A | Discharge status: Home / Self Care | Payer: BLUE CROSS/BLUE SHIELD | Attending: Emergency Medicine | Admitting: Emergency Medicine

## 2015-07-24 DIAGNOSIS — Z8719 Personal history of other diseases of the digestive system: Secondary | ICD-10-CM | POA: Diagnosis not present

## 2015-07-24 DIAGNOSIS — Z7951 Long term (current) use of inhaled steroids: Secondary | ICD-10-CM | POA: Insufficient documentation

## 2015-07-24 DIAGNOSIS — Z880 Allergy status to penicillin: Secondary | ICD-10-CM | POA: Diagnosis not present

## 2015-07-24 DIAGNOSIS — R202 Paresthesia of skin: Secondary | ICD-10-CM | POA: Insufficient documentation

## 2015-07-24 DIAGNOSIS — Z8639 Personal history of other endocrine, nutritional and metabolic disease: Secondary | ICD-10-CM | POA: Diagnosis not present

## 2015-07-24 DIAGNOSIS — I10 Essential (primary) hypertension: Secondary | ICD-10-CM | POA: Diagnosis not present

## 2015-07-24 DIAGNOSIS — R531 Weakness: Secondary | ICD-10-CM

## 2015-07-24 DIAGNOSIS — J45909 Unspecified asthma, uncomplicated: Secondary | ICD-10-CM | POA: Insufficient documentation

## 2015-07-24 DIAGNOSIS — Z79899 Other long term (current) drug therapy: Secondary | ICD-10-CM | POA: Diagnosis not present

## 2015-07-24 DIAGNOSIS — M6281 Muscle weakness (generalized): Secondary | ICD-10-CM | POA: Insufficient documentation

## 2015-07-24 DIAGNOSIS — Z9981 Dependence on supplemental oxygen: Secondary | ICD-10-CM | POA: Diagnosis not present

## 2015-07-24 LAB — RAPID URINE DRUG SCREEN, HOSP PERFORMED
AMPHETAMINES: NOT DETECTED
Barbiturates: NOT DETECTED
Benzodiazepines: NOT DETECTED
Cocaine: NOT DETECTED
OPIATES: NOT DETECTED
Tetrahydrocannabinol: NOT DETECTED

## 2015-07-24 LAB — COMPREHENSIVE METABOLIC PANEL
ALBUMIN: 2.1 g/dL — AB (ref 3.5–5.0)
ALT: 41 U/L (ref 17–63)
ANION GAP: 5 (ref 5–15)
AST: 59 U/L — ABNORMAL HIGH (ref 15–41)
Alkaline Phosphatase: 56 U/L (ref 38–126)
BILIRUBIN TOTAL: 1.3 mg/dL — AB (ref 0.3–1.2)
BUN: 7 mg/dL (ref 6–20)
CHLORIDE: 109 mmol/L (ref 101–111)
CO2: 24 mmol/L (ref 22–32)
Calcium: 7.8 mg/dL — ABNORMAL LOW (ref 8.9–10.3)
Creatinine, Ser: 0.8 mg/dL (ref 0.61–1.24)
GFR calc Af Amer: >60 mL/min (ref >60)
GFR calc non Af Amer: >60 mL/min (ref >60)
GLUCOSE: 87 mg/dL (ref 65–99)
POTASSIUM: 3.4 mmol/L — AB (ref 3.5–5.1)
SODIUM: 138 mmol/L (ref 135–145)
TOTAL PROTEIN: 5.3 g/dL — AB (ref 6.5–8.1)

## 2015-07-24 LAB — URINALYSIS, ROUTINE W REFLEX MICROSCOPIC
BILIRUBIN URINE: NEGATIVE
Glucose, UA: NEGATIVE mg/dL
Hgb urine dipstick: NEGATIVE
Ketones, ur: 15 mg/dL — AB
LEUKOCYTES UA: NEGATIVE
NITRITE: NEGATIVE
PH: 6 (ref 5.0–8.0)
Protein, ur: NEGATIVE mg/dL
SPECIFIC GRAVITY, URINE: 1.016 (ref 1.005–1.030)

## 2015-07-24 LAB — DIFFERENTIAL
BASOS ABS: 0 10*3/uL (ref 0.0–0.1)
BASOS PCT: 0 %
Eosinophils Absolute: 0.2 10*3/uL (ref 0.0–0.7)
Eosinophils Relative: 7 %
LYMPHS PCT: 17 %
Lymphs Abs: 0.5 10*3/uL — ABNORMAL LOW (ref 0.7–4.0)
Monocytes Absolute: 0.4 10*3/uL (ref 0.1–1.0)
Monocytes Relative: 13 %
NEUTROS ABS: 1.9 10*3/uL (ref 1.7–7.7)
NEUTROS PCT: 63 %

## 2015-07-24 LAB — CBC
HCT: 39 % (ref 39.0–52.0)
Hemoglobin: 13.6 g/dL (ref 13.0–17.0)
MCH: 38.2 pg — ABNORMAL HIGH (ref 26.0–34.0)
MCHC: 34.9 g/dL (ref 30.0–36.0)
MCV: 109.6 fL — AB (ref 78.0–100.0)
PLATELETS: 74 10*3/uL — AB (ref 150–400)
RBC: 3.56 MIL/uL — AB (ref 4.22–5.81)
RDW: 13.4 % (ref 11.5–15.5)
WBC: 3.1 10*3/uL — AB (ref 4.0–10.5)

## 2015-07-24 LAB — I-STAT TROPONIN, ED: TROPONIN I, POC: 0.02 ng/mL (ref 0.00–0.08)

## 2015-07-24 LAB — APTT: aPTT: 36 seconds (ref 24–37)

## 2015-07-24 LAB — PROTIME-INR
INR: 1.61 — ABNORMAL HIGH (ref 0.00–1.49)
Prothrombin Time: 19.2 seconds — ABNORMAL HIGH (ref 11.6–15.2)

## 2015-07-24 LAB — CBG MONITORING, ED: GLUCOSE-CAPILLARY: 94 mg/dL (ref 65–99)

## 2015-07-24 LAB — ETHANOL

## 2015-07-24 MED ORDER — LORAZEPAM 2 MG/ML IJ SOLN
2.0000 mg | Freq: Once | INTRAMUSCULAR | Status: AC
  Administered 2015-07-24: 2 mg via INTRAVENOUS
  Filled 2015-07-24: qty 1

## 2015-07-24 NOTE — ED Provider Notes (Signed)
CSN: [id number]     Arrival date & time 07/24/15  1952 History   First MD Initiated Contact with Patient 07/24/15 1958     Chief Complaint  Patient presents with  . Numbness  . Tingling     (Consider location/radiation/quality/duration/timing/severity/associated sxs/prior Treatment) HPI   Roy Case is a 50 y.o. male who presents for evaluation of a tingling sensation in his right arm and leg, associated with decreased ability to move the right arm and leg, and a sensation that he is drinking his foot when he is walking, all starting at 9 AM upon awakening this morning. He states that he was well when he went to bed last night. He denies headache, chest pain, nausea, vomiting, dysuria, or diarrhea. He did not eat as much today, because of lack of hunger, but did not notice that he was choking when eating. No prior similar problem. There are no other known modifying factors.   Past Medical History  Diagnosis Date  . Hypertension   . Allergic rhinitis   . Asthma   . High cholesterol   . Cirrhosis ([place])   . 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 History of home oxygen therapy     4 liters all the time   Past Surgical History  Procedure Laterality Date  . Cholecystectomy  may 2014  . Skin cancer excision Left     arm  . Carpal tunnel release Right   . Cardiac catheterization N/A 05/31/2015    Procedure: Right Heart Cath;  Surgeon: Larey Dresser, MD;  Location: [facility];  Service: Cardiovascular;  Laterality: N/A;  . Bone marrow biopsy  05-30-15   Family History  Problem Relation Age of Onset  . Lung cancer Mother   . Colon cancer Neg Hx   . Esophageal cancer Neg Hx   . Pancreatic cancer Neg Hx   . Kidney disease Neg Hx   . Liver disease Neg Hx    Social History  Substance Use Topics  . Smoking status: Never Smoker   . Smokeless tobacco: Never Used  . Alcohol Use: No    Review of Systems  All other systems reviewed and are  negative.     Allergies  Penicillins; Cefdinir; and Doxycycline  Home Medications   Prior to Admission medications   Medication Sig Start Date End Date Taking? Authorizing Provider  albuterol (PROVENTIL HFA;VENTOLIN HFA) 108 (90 Base) MCG/ACT inhaler Inhale 2 puffs into the lungs every 6 (six) hours as needed for wheezing or shortness of breath. 06/06/15  Yes Juanito Doom, MD  albuterol (PROVENTIL) (2.5 MG/3ML) 0.083% nebulizer solution Take 3 mLs (2.5 mg total) by nebulization every 4 (four) hours as needed for wheezing or shortness of breath. 05/03/15  Yes Golden Circle, FNP  ALPRAZolam Duanne Moron) 0.5 MG tablet Take 0.5 mg by mouth 2 (two) times daily as needed for anxiety.    Yes Historical Provider, MD  fluticasone furoate-vilanterol (BREO ELLIPTA) 100-25 MCG/INH AEPB Inhale 1 puff into the lungs daily. Reported on 05/16/2015 05/16/15  Yes Marijean Heath, NP  furosemide (LASIX) 40 MG tablet Take 1 tablet (40 mg total) by mouth daily. 06/27/15  Yes Volanda Napoleon, MD  ibuprofen (ADVIL,MOTRIN) 200 MG tablet Take 200 mg by mouth every 6 (six) hours as needed for moderate pain.   Yes Historical Provider, MD  OXYGEN Inhale 4 L into the lungs continuous.    Yes Historical Provider, MD  pantoprazole (PROTONIX) 40 MG tablet Take  40 mg by mouth daily.   Yes Historical Provider, MD  potassium chloride SA (K-DUR,KLOR-CON) 20 MEQ tablet Take 20 mEq by mouth daily.   Yes Historical Provider, MD  sertraline (ZOLOFT) 25 MG tablet Take 25 mg by mouth at bedtime.    Yes Historical Provider, MD  spironolactone (ALDACTONE) 25 MG tablet Take 25 mg by mouth 2 (two) times daily.   Yes Historical Provider, MD  propranolol (INDERAL) 40 MG tablet Take 1 tablet (40 mg total) by mouth 2 (two) times daily. Patient not taking: Reported on 07/24/2015 06/05/15   Larey Dresser, MD   BP 112/70 mmHg  Pulse 82  Temp(Src) 98.3 F (36.8 C) (Oral)  Resp 16  Ht '5\' 9"'  (1.753 m)  Wt 170 lb (77.111 kg)  BMI 25.09  kg/m2  SpO2 99% Physical Exam  Constitutional: He is oriented to person, place, and time. He appears well-developed and well-nourished. No distress.  HENT:  Head: Normocephalic and atraumatic.  Right Ear: External ear normal.  Left Ear: External ear normal.  Eyes: Conjunctivae and EOM are normal. Pupils are equal, round, and reactive to light.  Neck: Normal range of motion and phonation normal. Neck supple.  Cardiovascular: Normal rate, regular rhythm and normal heart sounds.   Pulmonary/Chest: Effort normal and breath sounds normal. He exhibits no bony tenderness.  Abdominal: Soft. There is no tenderness.  Musculoskeletal: Normal range of motion.  Neurological: He is alert and oriented to person, place, and time. No cranial nerve deficit or sensory deficit. He exhibits normal muscle tone. Coordination normal.  No dysarthria and aphasia or nystagmus. No facial asymmetry. Very minimal drift of right arm, with extension. Decreased ability to lift the right leg off the stretcher, he can get it to about 10 and holds it there well. He states that he feels too weak to raise it any higher.  Skin: Skin is warm, dry and intact.  Psychiatric: He has a normal mood and affect. His behavior is normal. Judgment and thought content normal.  Nursing note and vitals reviewed.   ED Course  Procedures (including critical care time)  Initial clinical impression- mild symptoms possibly consistent with CVA, time indeterminate, and present for nearly 12 hours since awakening, upon arrival. There is no indicator for acute peripheral thrombolysis, or intravascular thrombolysis.  Medications  LORazepam (ATIVAN) injection 2 mg (2 mg Intravenous Given 07/24/15 2232)    Patient Vitals for the past 24 hrs:  BP Temp Temp src Pulse Resp SpO2 Height Weight  07/25/15 0045 112/70 mmHg - - 82 16 99 % - -  07/25/15 0002 - - - - - 97 % - -  07/24/15 2230 101/71 mmHg - - 78 19 99 % - -  07/24/15 2200 113/71 mmHg - - 75  15 99 % - -  07/24/15 2130 108/62 mmHg - - 79 21 100 % - -  07/24/15 2105 117/79 mmHg - - 78 18 (!) 79 % - -  07/24/15 2103 - 98.3 F (36.8 C) - - - - - -  07/24/15 2030 106/74 mmHg - - 73 21 97 % - -  07/24/15 2001 117/69 mmHg 98.5 F (36.9 C) Oral 76 19 95 % '5\' 9"'  (1.753 m) 170 lb (77.111 kg)  07/24/15 2000 117/69 mmHg - - 75 (!) 27 92 % - -    9:55 PM Reevaluation with update and discussion. After initial assessment and treatment, an updated evaluation reveals He feels like the numbness is improving and he is  able to move the right leg, somewhat better. CT negative, MRI ordered. Marvelle Span L   12:27 AM- patient states that he is not currently feeling any numbness or tingling, and he told to use his cell phone with his right hand without difficulty. At this time, he is also able to lift the right leg, at least 3 feet off the stretcher without limitation.  12:28- page to neurology, for consultation- regarding inpatient versus outpatient follow-up. Dr. Nicole Kindred responded and feels like the patient can be evaluated as an outpatient.  12:51 AM- patient's son updated on findings, and are comfortable with discharge and will follow-up with his PCP.  Labs Review Labs Reviewed  PROTIME-INR - Abnormal; Notable for the following:    Prothrombin Time 19.2 (*)    INR 1.61 (*)    All other components within normal limits  CBC - Abnormal; Notable for the following:    WBC 3.1 (*)    RBC 3.56 (*)    MCV 109.6 (*)    MCH 38.2 (*)    Platelets 74 (*)    All other components within normal limits  DIFFERENTIAL - Abnormal; Notable for the following:    Lymphs Abs 0.5 (*)    All other components within normal limits  URINALYSIS, ROUTINE W REFLEX MICROSCOPIC (NOT AT [facility]) - Abnormal; Notable for the following:    Color, Urine AMBER (*)    APPearance CLOUDY (*)    Ketones, ur 15 (*)    All other components within normal limits  COMPREHENSIVE METABOLIC PANEL - Abnormal; Notable for the following:     Potassium 3.4 (*)    Calcium 7.8 (*)    Total Protein 5.3 (*)    Albumin 2.1 (*)    AST 59 (*)    Total Bilirubin 1.3 (*)    All other components within normal limits  APTT  ETHANOL  URINE RAPID DRUG SCREEN, HOSP PERFORMED  I-STAT TROPOININ, ED  CBG MONITORING, ED  I-STAT CHEM 8, ED  I-STAT TROPOININ, ED    Imaging Review Ct Head Wo Contrast  07/24/2015  CLINICAL DATA:  Right-sided numbness and tingling starting at 9 a.m. Photophobia. EXAM: CT HEAD WITHOUT CONTRAST TECHNIQUE: Contiguous axial images were obtained from the base of the skull through the vertex without intravenous contrast. COMPARISON:  10/25/2013 FINDINGS: Failure of fusion of the posterior arch of C1. The brainstem, cerebellum, cerebral peduncles, thalami, basal ganglia, basilar cisterns, and ventricular system appear within normal limits. No intracranial hemorrhage, mass lesion, or acute CVA. IMPRESSION: No significant abnormality identified. Electronically Signed   By: Van Clines M.D.   On: 07/24/2015 21:22   Mr Brain Wo Contrast  07/25/2015  CLINICAL DATA:  Initial evaluation for acute numbness and tingling in right arm and right leg with headache, light sensitivity. EXAM: MRI HEAD WITHOUT CONTRAST TECHNIQUE: Multiplanar, multiecho pulse sequences of the brain and surrounding structures were obtained without intravenous contrast. COMPARISON:  Air CT from earlier same day. FINDINGS: Cerebral volume within normal limits for patient age. No focal parenchymal signal abnormality identified. Minimal asymmetric T2/FLAIR hyperintensity within the left caudate head as compared to the right, which may reflect asymmetrically prominent perivascular spaces or possibly capillary telangiectasia. This is felt to be of no significance. No abnormal foci of restricted diffusion to suggest acute infarct identified. Gray-white matter differentiation maintained. Major intracranial vascular flow voids are preserved. No acute or chronic  intracranial hemorrhage. Single small focus of susceptibility artifact within the left globus pallidus likely related to mineralization.  No mass lesion, midline shift, or mass effect. No hydrocephalus. No extra-axial fluid collection. Major dural sinuses are grossly patent. Craniocervical junction within normal limits. Upper cervical spine not well evaluated on this exam due to motion artifact. There is question of a tiny T1 hypo intense lesion within the posterior aspect of the pituitary gland. This measures approximately 4 mm. This is of uncertain clinical significance, but felt to not be related to patient's symptoms. Pituitary otherwise unremarkable. No acute abnormality about the orbits. Paranasal sinuses are clear. Right-sided concha bullosa with leftward septal nasal deviation noted. Trace opacity within the posterior right mastoid air cells. Inner ear structures grossly normal. Bone marrow signal intensity within normal limits. No scalp soft tissue abnormality. IMPRESSION: 1. No acute intracranial process identified. 2. Question 4 mm T1 hypo intense lesion within the posterior aspect of the pituitary gland. This lesion is indeterminate, but felt to be an incidental finding, and not related to the patient's current symptoms. Correlation with laboratory values suggested. Additionally, this could be further assessed with pituitary protocol MRI, which could be performed on a nonemergent outpatient basis. 3. Otherwise normal brain MRI. Electronically Signed   By: Jeannine Boga M.D.   On: 07/25/2015 00:11   I have personally reviewed and evaluated these images and lab results as part of my medical decision-making.   EKG Interpretation   Date/Time:  Tuesday Jul 24 2015 19:59:29 EDT Ventricular Rate:  72 PR Interval:  184 QRS Duration: 92 QT Interval:  404 QTC Calculation: 442 R Axis:   21 Text Interpretation:  Sinus rhythm Baseline wander in lead(s) I since last  tracing no significant change  Confirmed by Eulis Foster  MD, Jamyra Zweig ([id number]) on  07/24/2015 9:18:04 PM      MDM   Final diagnoses:  Paresthesia  Weakness    Nonspecific paresthesias and weakness, right-sided. Symptoms improved, during evaluation. MRI negative for CVA. Incidental abnormal pituitary finding which will require outpatient follow-up and evaluation. Case was discussed with neurology.   Nursing Notes Reviewed/ Care Coordinated Applicable Imaging Reviewed Interpretation of Laboratory Data incorporated into ED treatment  The patient appears reasonably screened and/or stabilized for discharge and I doubt any other medical condition or other [facility] requiring further screening, evaluation, or treatment in the ED at this time prior to discharge.  Plan: Home Medications- usual; Home Treatments- rest; return here if the recommended treatment, does not improve the symptoms; Recommended follow up- PCP 1 week for checkup and further evaluation   Daleen Bo, MD 07/25/15 [phone]

## 2015-07-24 NOTE — ED Notes (Signed)
EDP at bedside  

## 2015-07-24 NOTE — ED Notes (Signed)
Pt in EMS from home, woke up 9am with numbness/tingling to R arm and R leg and HA, light sensitivity. EDP at bedside

## 2015-07-24 NOTE — ED Notes (Signed)
Pt to MRI

## 2015-07-25 NOTE — Discharge Instructions (Signed)
Call your primary care doctor to arrange a follow-up appointment. He will need to follow-up on your non-and weak feeling, which required you to come here today. It is possible that it was related to a migraine headache. The MRI showed a very small abnormality of the pituitary gland which can be followed up on laboratory testing, or possibly a dedicated MRI scan of the pituitary.    Paresthesia Paresthesia is an abnormal burning or prickling sensation. This sensation is generally felt in the hands, arms, legs, or feet. However, it may occur in any part of the body. Usually, it is not painful. The feeling may be described as:  Tingling or numbness.  Pins and needles.  Skin crawling.  Buzzing.  Limbs falling asleep.  Itching. Most people experience temporary (transient) paresthesia at some time in their lives. Paresthesia may occur when you breathe too quickly (hyperventilation). It can also occur without any apparent cause. Commonly, paresthesia occurs when pressure is placed on a nerve. The sensation quickly goes away after the pressure is removed. For some people, however, paresthesia is a long-lasting (chronic) condition that is caused by an underlying disorder. If you continue to have paresthesia, you may need further medical evaluation. HOME CARE INSTRUCTIONS Watch your condition for any changes. Taking the following actions may help to lessen any discomfort that you are feeling:  Avoid drinking alcohol.  Try acupuncture or massage to help relieve your symptoms.  Keep all follow-up visits as directed by your health care provider. This is important. SEEK MEDICAL CARE IF:  You continue to have episodes of paresthesia.  Your burning or prickling feeling gets worse when you walk.  You have pain, cramps, or dizziness.  You develop a rash. SEEK IMMEDIATE MEDICAL CARE IF:  You feel weak.  You have trouble walking or moving.  You have problems with speech, understanding, or  vision.  You feel confused.  You cannot control your bladder or bowel movements.  You have numbness after an injury.  You faint.   This information is not intended to replace advice given to you by your health care provider. Make sure you discuss any questions you have with your health care provider.   Document Released: 02/21/2002 Document Revised: 07/18/2014 Document Reviewed: 02/27/2014 Elsevier Interactive Patient Education 2016 [employer]  Weakness Weakness is a lack of strength. You may feel weak all over your body or just in one part of your body. Weakness can be serious. In some cases, you may need more medical tests. HOME CARE  Rest.  Eat a well-balanced diet.  Try to exercise every day.  Only take medicines as told by your doctor. GET HELP RIGHT AWAY IF:   You cannot do your normal daily activities.  You cannot walk up and down stairs, or you feel very tired when you do so.  You have shortness of breath or chest pain.  You have trouble moving parts of your body.  You have weakness in only one body part or on only one side of the body.  You have a fever.  You have trouble speaking or swallowing.  You cannot control when you pee (urinate) or poop (bowel movement).  You have black or bloody throw up (vomit) or poop.  Your weakness gets worse or spreads to other body parts.  You have new aches or pains. MAKE SURE YOU:   Understand these instructions.  Will watch your condition.  Will get help right away if you are not doing well or get  worse.   This information is not intended to replace advice given to you by your health care provider. Make sure you discuss any questions you have with your health care provider.   Document Released: 02/14/2008 Document Revised: 09/02/2011 Document Reviewed: 05/02/2011 Elsevier Interactive Patient Education [employer]2016 [employer]

## 2015-07-27 ENCOUNTER — Ambulatory Visit (INDEPENDENT_AMBULATORY_CARE_PROVIDER_SITE_OTHER): Payer: BLUE CROSS/BLUE SHIELD | Admitting: Family

## 2015-07-27 ENCOUNTER — Encounter: Payer: Self-pay | Admitting: Family

## 2015-07-27 VITALS — BP 102/70 | HR 77 | Temp 97.8°F | Ht 69.0 in | Wt 167.0 lb

## 2015-07-27 DIAGNOSIS — E237 Disorder of pituitary gland, unspecified: Secondary | ICD-10-CM | POA: Insufficient documentation

## 2015-07-27 DIAGNOSIS — R200 Anesthesia of skin: Secondary | ICD-10-CM | POA: Insufficient documentation

## 2015-07-27 DIAGNOSIS — R202 Paresthesia of skin: Secondary | ICD-10-CM | POA: Diagnosis not present

## 2015-07-27 MED ORDER — ACETAMINOPHEN-CODEINE #3 300-30 MG PO TABS: 1.0000 | ORAL_TABLET | Freq: Four times a day (QID) | ORAL | Status: DC | PRN

## 2015-07-27 NOTE — Progress Notes (Signed)
Pre visit review using our clinic review tool, if applicable. No additional management support is needed unless otherwise documented below in the visit note. 

## 2015-07-27 NOTE — Progress Notes (Signed)
Subjective:    Patient ID: Roy Case, male    DOB: [birth year], 50 y.o.   MRN: [id number]  Chief Complaint  Patient presents with  . Hospitalization Follow-up    HPI:  Roy Case is a 50 y.o. male who  has a past medical history of Hypertension; Allergic rhinitis; Asthma; High cholesterol; Cirrhosis ([place]); Idiopathic hemochromatosis ([place]) (06/04/2015); and History of home oxygen therapy. and presents today For an office follow-up.  This is a new problem. Recently evaluated in the emergency room for numbness and tingling located in his right arm and leg associated with decreased ability to move and a sensation that he is dragging his left foot when walking. Physical exam with no dysarthria or aphasia; no facial symmetry. Minimal drift of the right arm with extension noted. CT scan of the head with no significant abnormalities. MRI of the brain without contrast showed no acute intracranial process with a questionable 4 mm T1 hypointense lesion within the posterior aspect of the pituitary gland which was indeterminant but felt to be incidental finding and not related to patient's current condition. Symptoms generally improved during evaluation with neurology agreeing for outpatient follow-up.  Since leaving the ED he reports that the numbness and tingling has resolved but continues to experience mild weakness in primarily his right leg. Reports his right upper extremity is normal. Denies any back pain. Course of the symptoms appears to be continually improving over time. Activities of daily living have returned to baseline. Continues to take his medication as prescribed and denies adverse side effects. Does have some mild pain located where his catheter is placed.   Allergies  Allergen Reactions  . Penicillins Anaphylaxis and Other (See Comments)    Has patient had a PCN reaction causing immediate rash, facial/tongue/throat swelling, SOB or lightheadedness with hypotension: YES Has  patient had a PCN reaction causing severe rash involving mucus membranes or skin necrosis:NO Has patient had a PCN reaction occurring within the last 10 years: NO If all of the above answers are "NO", then may proceed with Cephalosporin use.  . Cefdinir     Blisters in mouth   . Doxycycline Rash     Current Outpatient Prescriptions on File Prior to Visit  Medication Sig Dispense Refill  . albuterol (PROVENTIL HFA;VENTOLIN HFA) 108 (90 Base) MCG/ACT inhaler Inhale 2 puffs into the lungs every 6 (six) hours as needed for wheezing or shortness of breath. 1 Inhaler 3  . albuterol (PROVENTIL) (2.5 MG/3ML) 0.083% nebulizer solution Take 3 mLs (2.5 mg total) by nebulization every 4 (four) hours as needed for wheezing or shortness of breath. 75 mL 2  . ALPRAZolam (XANAX) 0.5 MG tablet Take 0.5 mg by mouth 2 (two) times daily as needed for anxiety.     . fluticasone furoate-vilanterol (BREO ELLIPTA) 100-25 MCG/INH AEPB Inhale 1 puff into the lungs daily. Reported on 05/16/2015 28 each 1  . furosemide (LASIX) 40 MG tablet Take 1 tablet (40 mg total) by mouth daily. 30 tablet 3  . ibuprofen (ADVIL,MOTRIN) 200 MG tablet Take 200 mg by mouth every 6 (six) hours as needed for moderate pain.    . OXYGEN Inhale 4 L into the lungs continuous.     . pantoprazole (PROTONIX) 40 MG tablet Take 40 mg by mouth daily.    . potassium chloride SA (K-DUR,KLOR-CON) 20 MEQ tablet Take 20 mEq by mouth daily.    . propranolol (INDERAL) 40 MG tablet Take 1 tablet (40 mg total) by mouth  2 (two) times daily. 180 tablet 3  . sertraline (ZOLOFT) 25 MG tablet Take 25 mg by mouth at bedtime.     Marland Kitchen spironolactone (ALDACTONE) 25 MG tablet Take 25 mg by mouth 2 (two) times daily.     No current facility-administered medications on file prior to visit.    Past Medical History  Diagnosis Date  . Hypertension   . Allergic rhinitis   . Asthma   . High cholesterol   . Cirrhosis ([place])   . 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 History of home oxygen therapy     4 liters all the time     Past Surgical History  Procedure Laterality Date  . Cholecystectomy  may 2014  . Skin cancer excision Left     arm  . Carpal tunnel release Right   . Cardiac catheterization N/A 05/31/2015    Procedure: Right Heart Cath;  Surgeon: Larey Dresser, MD;  Location: [facility];  Service: Cardiovascular;  Laterality: N/A;  . Bone marrow biopsy  05-30-15    .  Review of Systems  Constitutional: Negative for fever and chills.  Respiratory: Negative for chest tightness and shortness of breath.   Cardiovascular: Negative for chest pain, palpitations and leg swelling.  Neurological: Positive for weakness. Negative for numbness.      Objective:    BP 102/70 mmHg  Pulse 77  Temp(Src) 97.8 F (36.6 C) (Oral)  Ht 5' 9" (1.753 m)  Wt 167 lb (75.751 kg)  BMI 24.65 kg/m2  SpO2 93% Nursing note and vital signs reviewed.  Physical Exam  Constitutional: He is oriented to person, place, and time. He appears well-developed and well-nourished. No distress.  Seated in the chair with oxygen in place via nasal cannula.   Cardiovascular: Normal rate, regular rhythm, normal heart sounds and intact distal pulses.   Pulmonary/Chest: Effort normal and breath sounds normal.  Neurological: He is alert and oriented to person, place, and time.  Upper and lower extremity muscle strength and sensation are intact and appropriate with only a possible trace of weakness with all muscle testing 4+/5.   Skin: Skin is warm and dry.  Psychiatric: He has a normal mood and affect. His behavior is normal. Judgment and thought content normal.       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 Further imaging was recommend with concern for more significant pathology.  Refer  to endocrinology for further evaluation and work up.        Relevant Orders   Ambulatory referral to Endocrinology     Other   Numbness and tingling - Primary    Numbness and tingling appears resolved with questionable trace weakness if any. Patient reports symptoms are generally improving. No further treatment is indicated at this time. Will continue to monitor and follow up if symptoms return.           I am having Mr. Canelo start on acetaminophen-codeine. I am also having him maintain his sertraline, ALPRAZolam, OXYGEN, albuterol, fluticasone furoate-vilanterol, propranolol, albuterol, furosemide, spironolactone, potassium chloride SA, pantoprazole, and ibuprofen.   Meds ordered this encounter  Medications  . acetaminophen-codeine (TYLENOL #3) 300-30 MG tablet    Sig: Take 1 tablet by mouth every 6 (six) hours as needed for moderate  pain.    Dispense:  56 tablet    Refill:  0    Order Specific Question:  Supervising Provider    Answer:  Pricilla Holm A [[id number]]     Follow-up: Return in about 2 months (around 09/26/2015), or if symptoms worsen or fail to improve.  Mauricio Po, FNP

## 2015-07-27 NOTE — Assessment & Plan Note (Addendum)
4 mm pituitary lesion noted on MRI and believed to be incidental finding with recommend follow up as outpatient. Further imaging was recommend with concern for more significant pathology.  Refer to endocrinology for further evaluation and work up.

## 2015-07-27 NOTE — Assessment & Plan Note (Signed)
Numbness and tingling appears resolved with questionable trace weakness if any. Patient reports symptoms are generally improving. No further treatment is indicated at this time. Will continue to monitor and follow up if symptoms return.

## 2015-07-27 NOTE — Patient Instructions (Signed)
Thank you for choosing [facility].  Summary/Instructions:  Please take the Tylenol 3 as needed for discomfort.  Please continue to take your medications as prescribed.   Your prescription(s) have been submitted to your pharmacy or been printed and provided for you. Please take as directed and contact our office if you believe you are having problem(s) with the medication(s) or have any questions.  Referrals have been made during this visit. You should expect to hear back from our schedulers in about 7-10 days in regards to establishing an appointment with the specialists we discussed.   If your symptoms worsen or fail to improve, please contact our office for further instruction, or in case of emergency go directly to the emergency room at the closest medical facility.

## 2015-07-30 ENCOUNTER — Telehealth: Payer: Self-pay | Admitting: Family

## 2015-07-30 DIAGNOSIS — J9611 Chronic respiratory failure with hypoxia: Secondary | ICD-10-CM

## 2015-07-30 NOTE — Telephone Encounter (Signed)
Referral sent 

## 2015-07-30 NOTE — Telephone Encounter (Signed)
LVM letting pts Cousin know.

## 2015-07-30 NOTE — Telephone Encounter (Signed)
Rosemary,POA cousin (will fax copy of the POA to [phone]), called request referral for [name] to go see Dr. Erin FullingAllen Hayes phone # [phone], this is for an OT to help him work on his ADL due his medical condition. Please call her back today once the referral enter.

## 2015-07-31 ENCOUNTER — Encounter (HOSPITAL_COMMUNITY): Payer: Self-pay | Admitting: Emergency Medicine

## 2015-07-31 ENCOUNTER — Emergency Department (HOSPITAL_COMMUNITY): Payer: BLUE CROSS/BLUE SHIELD

## 2015-07-31 ENCOUNTER — Emergency Department (HOSPITAL_COMMUNITY)
Admission: EM | Admit: 2015-07-31 | Discharge: 2015-07-31 | Disposition: A | Discharge status: Home / Self Care | Payer: BLUE CROSS/BLUE SHIELD | Attending: Emergency Medicine | Admitting: Emergency Medicine

## 2015-07-31 DIAGNOSIS — I10 Essential (primary) hypertension: Secondary | ICD-10-CM | POA: Insufficient documentation

## 2015-07-31 DIAGNOSIS — Z880 Allergy status to penicillin: Secondary | ICD-10-CM | POA: Insufficient documentation

## 2015-07-31 DIAGNOSIS — J209 Acute bronchitis, unspecified: Secondary | ICD-10-CM | POA: Insufficient documentation

## 2015-07-31 DIAGNOSIS — Z8719 Personal history of other diseases of the digestive system: Secondary | ICD-10-CM | POA: Insufficient documentation

## 2015-07-31 DIAGNOSIS — Z8639 Personal history of other endocrine, nutritional and metabolic disease: Secondary | ICD-10-CM | POA: Insufficient documentation

## 2015-07-31 DIAGNOSIS — J441 Chronic obstructive pulmonary disease with (acute) exacerbation: Secondary | ICD-10-CM | POA: Insufficient documentation

## 2015-07-31 DIAGNOSIS — Z9981 Dependence on supplemental oxygen: Secondary | ICD-10-CM | POA: Insufficient documentation

## 2015-07-31 DIAGNOSIS — Z79899 Other long term (current) drug therapy: Secondary | ICD-10-CM | POA: Diagnosis not present

## 2015-07-31 DIAGNOSIS — R0602 Shortness of breath: Secondary | ICD-10-CM | POA: Diagnosis present

## 2015-07-31 DIAGNOSIS — J440 Chronic obstructive pulmonary disease with acute lower respiratory infection: Secondary | ICD-10-CM | POA: Diagnosis not present

## 2015-07-31 DIAGNOSIS — Z791 Long term (current) use of non-steroidal anti-inflammatories (NSAID): Secondary | ICD-10-CM | POA: Insufficient documentation

## 2015-07-31 DIAGNOSIS — Z9889 Other specified postprocedural states: Secondary | ICD-10-CM | POA: Diagnosis not present

## 2015-07-31 DIAGNOSIS — J40 Bronchitis, not specified as acute or chronic: Secondary | ICD-10-CM

## 2015-07-31 LAB — CBC WITH DIFFERENTIAL/PLATELET
BASOS ABS: 0 10*3/uL (ref 0.0–0.1)
BASOS PCT: 1 %
EOS ABS: 0.1 10*3/uL (ref 0.0–0.7)
Eosinophils Relative: 4 %
HCT: 38 % — ABNORMAL LOW (ref 39.0–52.0)
HEMOGLOBIN: 13.2 g/dL (ref 13.0–17.0)
LYMPHS ABS: 0.5 10*3/uL — AB (ref 0.7–4.0)
Lymphocytes Relative: 14 %
MCH: 37.6 pg — ABNORMAL HIGH (ref 26.0–34.0)
MCHC: 34.7 g/dL (ref 30.0–36.0)
MCV: 108.3 fL — ABNORMAL HIGH (ref 78.0–100.0)
Monocytes Absolute: 0.5 10*3/uL (ref 0.1–1.0)
Monocytes Relative: 15 %
NEUTROS PCT: 66 %
Neutro Abs: 2.3 10*3/uL (ref 1.7–7.7)
Platelets: 76 10*3/uL — ABNORMAL LOW (ref 150–400)
RBC: 3.51 MIL/uL — AB (ref 4.22–5.81)
RDW: 13.1 % (ref 11.5–15.5)
WBC: 3.4 10*3/uL — AB (ref 4.0–10.5)

## 2015-07-31 LAB — BASIC METABOLIC PANEL
Anion gap: 5 (ref 5–15)
BUN: 7 mg/dL (ref 6–20)
CHLORIDE: 109 mmol/L (ref 101–111)
CO2: 24 mmol/L (ref 22–32)
CREATININE: 0.72 mg/dL (ref 0.61–1.24)
Calcium: 8.2 mg/dL — ABNORMAL LOW (ref 8.9–10.3)
GFR calc non Af Amer: >60 mL/min (ref >60)
Glucose, Bld: 104 mg/dL — ABNORMAL HIGH (ref 65–99)
Potassium: 3.4 mmol/L — ABNORMAL LOW (ref 3.5–5.1)
Sodium: 138 mmol/L (ref 135–145)

## 2015-07-31 MED ORDER — LEVOFLOXACIN 500 MG PO TABS
500.0000 mg | ORAL_TABLET | Freq: Every day | ORAL | Status: DC
Start: 2015-07-31 — End: 2015-08-21

## 2015-07-31 MED ORDER — LEVOFLOXACIN 500 MG PO TABS
500.0000 mg | ORAL_TABLET | Freq: Once | ORAL | Status: AC
  Administered 2015-07-31: 500 mg via ORAL
  Filled 2015-07-31: qty 1

## 2015-07-31 MED ORDER — SODIUM CHLORIDE 0.9 % IV BOLUS (SEPSIS)
500.0000 mL | Freq: Once | INTRAVENOUS | Status: AC
  Administered 2015-07-31: 500 mL via INTRAVENOUS

## 2015-07-31 NOTE — ED Provider Notes (Signed)
CSN: [id number]     Arrival date & time 07/31/15  1034 History   First MD Initiated Contact with Patient 07/31/15 1040     Chief Complaint  Patient presents with  . Shortness of Breath     (Consider location/radiation/quality/duration/timing/severity/associated sxs/prior Treatment) HPI [name] history of COPD, idiopathic cirrhosis, presents today complaining of increased coughing and dyspnea. He has history of effusion and has a pleural catheter in place for the past several weeks. This has been draining without difficulty. He noted some increased coughing which is nonproductive. He had some right anterior chest pain with this. He has not had any fever but has noted some chills. He is not currently taking any antibiotics and last took antibiotics about 4 weeks ago is on amoxicillin. He is on oxygen at home. EnRoute to see his doctor his oxygen tank ran out.  His friend was unfamiliar with the area and called 911. He was transported here via EMS. On his home oxygen at 5 L he feels at baseline. He has been taking by mouth without difficulty. Past Medical History  Diagnosis Date  . Hypertension   . Allergic rhinitis   . Asthma   . High cholesterol   . Cirrhosis ([place])   . 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 History of home oxygen therapy     4 liters all the time   Past Surgical History  Procedure Laterality Date  . Cholecystectomy  may 2014  . Skin cancer excision Left     arm  . Carpal tunnel release Right   . Cardiac catheterization N/A 05/31/2015    Procedure: Right Heart Cath;  Surgeon: Larey Dresser, MD;  Location: [facility];  Service: Cardiovascular;  Laterality: N/A;  . Bone marrow biopsy  05-30-15   Family History  Problem Relation Age of Onset  . Lung cancer Mother   . Colon cancer Neg Hx   . Esophageal cancer Neg Hx   . Pancreatic cancer Neg Hx   . Kidney disease Neg Hx   . Liver disease Neg Hx    Social History  Substance Use Topics  .  Smoking status: Never Smoker   . Smokeless tobacco: Never Used  . Alcohol Use: No    Review of Systems  All other systems reviewed and are negative.     Allergies  Penicillins; Azithromycin; Cefdinir; and Doxycycline  Home Medications   Prior to Admission medications   Medication Sig Start Date End Date Taking? Authorizing Provider  albuterol (PROVENTIL HFA;VENTOLIN HFA) 108 (90 Base) MCG/ACT inhaler Inhale 2 puffs into the lungs every 6 (six) hours as needed for wheezing or shortness of breath. 06/06/15  Yes Juanito Doom, MD  albuterol (PROVENTIL) (2.5 MG/3ML) 0.083% nebulizer solution Take 3 mLs (2.5 mg total) by nebulization every 4 (four) hours as needed for wheezing or shortness of breath. 05/03/15  Yes Golden Circle, FNP  ALPRAZolam Duanne Moron) 0.5 MG tablet Take 0.5 mg by mouth 2 (two) times daily as needed for anxiety.    Yes Historical Provider, MD  fluticasone furoate-vilanterol (BREO ELLIPTA) 100-25 MCG/INH AEPB Inhale 1 puff into the lungs daily. Reported on 05/16/2015 05/16/15  Yes Marijean Heath, NP  furosemide (LASIX) 40 MG tablet Take 1 tablet (40 mg total) by mouth daily. Patient taking differently: Take 40 mg by mouth 2 (two) times daily.  06/27/15  Yes Volanda Napoleon, MD  ibuprofen (ADVIL,MOTRIN) 800 MG tablet Take 800 mg by mouth 3 (three) times  daily.   Yes Historical Provider, MD  OXYGEN Inhale 4.5-5 L into the lungs continuous.    Yes Historical Provider, MD  pantoprazole (PROTONIX) 40 MG tablet Take 40 mg by mouth daily.   Yes Historical Provider, MD  potassium chloride SA (K-DUR,KLOR-CON) 20 MEQ tablet Take 20 mEq by mouth daily.   Yes Historical Provider, MD  sertraline (ZOLOFT) 25 MG tablet Take 25 mg by mouth daily as needed (anxiety).    Yes Historical Provider, MD  spironolactone (ALDACTONE) 25 MG tablet Take 25 mg by mouth 2 (two) times daily.   Yes Historical Provider, MD  acetaminophen-codeine (TYLENOL #3) 300-30 MG tablet Take 1 tablet by mouth  every 6 (six) hours as needed for moderate pain. Patient not taking: Reported on 07/31/2015 07/27/15   Golden Circle, FNP  ibuprofen (ADVIL,MOTRIN) 200 MG tablet Take 200 mg by mouth every 6 (six) hours as needed for moderate pain.    Historical Provider, MD  propranolol (INDERAL) 40 MG tablet Take 1 tablet (40 mg total) by mouth 2 (two) times daily. Patient not taking: Reported on 07/31/2015 06/05/15   Larey Dresser, MD   BP 107/61 mmHg  Pulse 68  Temp(Src) 99.1 F (37.3 C) (Oral)  Resp 18  Ht '5\' 9"'  (1.753 m)  Wt 76.658 kg  BMI 24.95 kg/m2  SpO2 97% Physical Exam  Constitutional: He is oriented to person, place, and time. He appears well-developed and well-nourished.  HENT:  Head: Normocephalic and atraumatic.  Right Ear: External ear normal.  Left Ear: External ear normal.  Nose: Nose normal.  Mouth/Throat: Oropharynx is clear and moist.  Eyes: Conjunctivae and EOM are normal. Pupils are equal, round, and reactive to light.  Neck: Normal range of motion. Neck supple.  Cardiovascular: Normal rate, regular rhythm, normal heart sounds and intact distal pulses.   Pulmonary/Chest: Effort normal and breath sounds normal. No respiratory distress. He has no wheezes. He exhibits no tenderness.  Right pleural catheter site is non-indurated, not erythematous, pus or warmth noted. Rest sounds are decreased bilaterally with no wheezes or rhonchi noted.  Abdominal: Soft. Bowel sounds are normal. He exhibits no distension and no mass. There is no tenderness. There is no guarding.  Musculoskeletal: Normal range of motion.  Neurological: He is alert and oriented to person, place, and time. He has normal reflexes. He exhibits normal muscle tone. Coordination normal.  Skin: Skin is warm and dry.  Psychiatric: He has a normal mood and affect. His behavior is normal. Judgment and thought content normal.  Nursing note and vitals reviewed.   ED Course  Procedures (including critical care  time) Labs Review Labs Reviewed  BASIC METABOLIC PANEL - Abnormal; Notable for the following:    Potassium 3.4 (*)    Glucose, Bld 104 (*)    Calcium 8.2 (*)    All other components within normal limits  CBC WITH DIFFERENTIAL/PLATELET - Abnormal; Notable for the following:    WBC 3.4 (*)    RBC 3.51 (*)    HCT 38.0 (*)    MCV 108.3 (*)    MCH 37.6 (*)    Platelets 76 (*)    Lymphs Abs 0.5 (*)    All other components within normal limits  CULTURE, BLOOD (ROUTINE X 2)  CULTURE, BLOOD (ROUTINE X 2)    Imaging Review Dg Chest 2 View  07/31/2015  CLINICAL DATA:  Worsening shortness breath and cough for 1 day. Chest pain radiating to back. Right pleural effusion. Cirrhosis. EXAM:  CHEST  2 VIEW COMPARISON:  07/21/2015 FINDINGS: Right-sided pleural catheter remains in place. Tiny residual right pleural effusion is noted posteriorly on the lateral projection. No pneumothorax visualized. No evidence of pulmonary infiltrate or edema. Heart size is normal. IMPRESSION: Tiny residual right posterior pleural effusion with right pleural catheter remaining in place. Electronically Signed   By: Earle Gell M.D.   On: 07/31/2015 11:46   I have personally reviewed and evaluated these images and lab results as part of my medical decision-making.   EKG Interpretation   Date/Time:  Tuesday Jul 31 2015 10:36:22 EDT Ventricular Rate:  83 PR Interval:  182 QRS Duration: 93 QT Interval:  382 QTC Calculation: 449 R Axis:   69 Text Interpretation:  Normal sinus rhythm No significant change since last  tracing 07/24/15 Confirmed by Myeasha Ballowe MD, Andee Poles [id number]) on 07/31/2015 10:42:55  AM      MDM   Final diagnoses:  Bronchitis    50 year old male with COPD and pleural catheter presents today with chills and increased cough which is nonproductive. Given the fact that the patient has multiple    Pattricia Boss, MD 07/31/15 1537

## 2015-07-31 NOTE — ED Notes (Signed)
[facility] from home, was inroute to PCP for SOB - could not be seen so called EMS, (5L chronically) arrives on 6L, VSS, also c/o cough and right sided abd pain, A/O X4 and in NAD

## 2015-07-31 NOTE — Discharge Instructions (Signed)

## 2015-08-02 ENCOUNTER — Emergency Department (HOSPITAL_COMMUNITY)
Admission: EM | Admit: 2015-08-02 | Discharge: 2015-08-03 | Disposition: A | Discharge status: Home / Self Care | Payer: BLUE CROSS/BLUE SHIELD | Attending: Emergency Medicine | Admitting: Emergency Medicine

## 2015-08-02 ENCOUNTER — Telehealth: Payer: Self-pay | Admitting: Internal Medicine

## 2015-08-02 ENCOUNTER — Emergency Department (HOSPITAL_COMMUNITY): Payer: BLUE CROSS/BLUE SHIELD

## 2015-08-02 ENCOUNTER — Encounter (HOSPITAL_COMMUNITY): Payer: Self-pay

## 2015-08-02 ENCOUNTER — Ambulatory Visit (INDEPENDENT_AMBULATORY_CARE_PROVIDER_SITE_OTHER): Payer: BLUE CROSS/BLUE SHIELD | Admitting: Family

## 2015-08-02 VITALS — BP 128/82 | HR 89 | Temp 98.5°F | Resp 20 | Wt 176.0 lb

## 2015-08-02 DIAGNOSIS — I10 Essential (primary) hypertension: Secondary | ICD-10-CM | POA: Diagnosis not present

## 2015-08-02 DIAGNOSIS — Z79899 Other long term (current) drug therapy: Secondary | ICD-10-CM | POA: Diagnosis not present

## 2015-08-02 DIAGNOSIS — R0602 Shortness of breath: Secondary | ICD-10-CM | POA: Insufficient documentation

## 2015-08-02 DIAGNOSIS — R05 Cough: Secondary | ICD-10-CM | POA: Diagnosis not present

## 2015-08-02 DIAGNOSIS — Z791 Long term (current) use of non-steroidal anti-inflammatories (NSAID): Secondary | ICD-10-CM | POA: Diagnosis not present

## 2015-08-02 DIAGNOSIS — J45909 Unspecified asthma, uncomplicated: Secondary | ICD-10-CM | POA: Diagnosis not present

## 2015-08-02 DIAGNOSIS — Z7951 Long term (current) use of inhaled steroids: Secondary | ICD-10-CM | POA: Insufficient documentation

## 2015-08-02 DIAGNOSIS — R059 Cough, unspecified: Secondary | ICD-10-CM

## 2015-08-02 LAB — COMPREHENSIVE METABOLIC PANEL
ALBUMIN: 2.5 g/dL — AB (ref 3.5–5.0)
ALT: 61 U/L (ref 17–63)
ANION GAP: 5 (ref 5–15)
AST: 95 U/L — ABNORMAL HIGH (ref 15–41)
Alkaline Phosphatase: 82 U/L (ref 38–126)
BILIRUBIN TOTAL: 1.9 mg/dL — AB (ref 0.3–1.2)
BUN: 14 mg/dL (ref 6–20)
CO2: 23 mmol/L (ref 22–32)
Calcium: 8.3 mg/dL — ABNORMAL LOW (ref 8.9–10.3)
Chloride: 110 mmol/L (ref 101–111)
Creatinine, Ser: 0.7 mg/dL (ref 0.61–1.24)
GFR calc Af Amer: >60 mL/min (ref >60)
GFR calc non Af Amer: >60 mL/min (ref >60)
GLUCOSE: 83 mg/dL (ref 65–99)
POTASSIUM: 3.7 mmol/L (ref 3.5–5.1)
SODIUM: 138 mmol/L (ref 135–145)
TOTAL PROTEIN: 5.7 g/dL — AB (ref 6.5–8.1)

## 2015-08-02 LAB — CBC WITH DIFFERENTIAL/PLATELET
Basophils Absolute: 0 10*3/uL (ref 0.0–0.1)
Basophils Relative: 1 %
Eosinophils Absolute: 0.1 10*3/uL (ref 0.0–0.7)
Eosinophils Relative: 2 %
HCT: 42.1 % (ref 39.0–52.0)
Hemoglobin: 15 g/dL (ref 13.0–17.0)
Lymphocytes Relative: 16 %
Lymphs Abs: 0.7 10*3/uL (ref 0.7–4.0)
MCH: 38.1 pg — ABNORMAL HIGH (ref 26.0–34.0)
MCHC: 35.6 g/dL (ref 30.0–36.0)
MCV: 106.9 fL — ABNORMAL HIGH (ref 78.0–100.0)
Monocytes Absolute: 0.6 10*3/uL (ref 0.1–1.0)
Monocytes Relative: 14 %
Neutro Abs: 3.1 10*3/uL (ref 1.7–7.7)
Neutrophils Relative %: 67 %
Platelets: 98 10*3/uL — ABNORMAL LOW (ref 150–400)
RBC: 3.94 MIL/uL — ABNORMAL LOW (ref 4.22–5.81)
RDW: 13.2 % (ref 11.5–15.5)
WBC Morphology: INCREASED
WBC: 4.5 10*3/uL (ref 4.0–10.5)

## 2015-08-02 LAB — BRAIN NATRIURETIC PEPTIDE: B Natriuretic Peptide: 25.5 pg/mL (ref 0.0–100.0)

## 2015-08-02 LAB — I-STAT TROPONIN, ED: Troponin i, poc: 0 ng/mL (ref 0.00–0.08)

## 2015-08-02 MED ORDER — IOPAMIDOL (ISOVUE-370) INJECTION 76%
100.0000 mL | Freq: Once | INTRAVENOUS | Status: AC | PRN
  Administered 2015-08-02: 100 mL via INTRAVENOUS

## 2015-08-02 MED ORDER — IPRATROPIUM-ALBUTEROL 0.5-2.5 (3) MG/3ML IN SOLN
3.0000 mL | Freq: Once | RESPIRATORY_TRACT | Status: AC
  Administered 2015-08-02: 3 mL via RESPIRATORY_TRACT
  Filled 2015-08-02: qty 3

## 2015-08-02 NOTE — ED Notes (Signed)
Pt was seen by primary on Tuesday and given Levaquin for bronchitis, pt is getting worse and today home health nurse said she couldn't hear any lung sounds in his upper right lobe, sent hear for further evaluation for possible pneumonia

## 2015-08-02 NOTE — Progress Notes (Signed)
Pre visit review using our clinic review tool, if applicable. No additional management support is needed unless otherwise documented below in the visit note. 

## 2015-08-02 NOTE — ED Notes (Signed)
Unable to collect labs at this time patient going to xray 

## 2015-08-02 NOTE — ED Notes (Signed)
PA at bedside.

## 2015-08-02 NOTE — ED Provider Notes (Signed)
CSN: [id number]     Arrival date & time 08/02/15  1813 History   First MD Initiated Contact with Patient 08/02/15 1950     Chief Complaint  Patient presents with  . Shortness of Breath     (Consider location/radiation/quality/duration/timing/severity/associated sxs/prior Treatment) HPI Comments: Patient is a 50 year old male with history of liver failure and COPD who presents with shortness of breath and cough. Patient states he was seen 2 days ago and [facility] for Levaquin for bronchitis. The patient states he has had increasing dry cough, shortness of breath with exertion and at rest, fatigue, chills, sweats. He denies fever at home. Patient has an indwelling catheter for pleural effusions. His family member states that there is 400-500 mL removed 3 days per week. Patient has associated pleuritic chest pain. Patient denies chest pain without deep breath and cough. Patient denies abdominal pain, nausea, vomiting, dysuria, new leg swelling. Patient has been on 5 L of oxygen at home for the past 3 weeks.  Patient is a 50 y.o. male presenting with shortness of breath. The history is provided by the patient.  Shortness of Breath Associated symptoms: cough   Associated symptoms: no abdominal pain, no chest pain, no fever, no headaches, no rash, no sore throat and no vomiting     Past Medical History  Diagnosis Date  . Hypertension   . Allergic rhinitis   . Asthma   . High cholesterol   . Cirrhosis ([place])   . 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 History of home oxygen therapy     4 liters all the time   Past Surgical History  Procedure Laterality Date  . Cholecystectomy  may 2014  . Skin cancer excision Left     arm  . Carpal tunnel release Right   . Cardiac catheterization N/A 05/31/2015    Procedure: Right Heart Cath;  Surgeon: Larey Dresser, MD;  Location: [facility];  Service: Cardiovascular;  Laterality: N/A;  . Bone marrow biopsy  05-30-15   Family History   Problem Relation Age of Onset  . Lung cancer Mother   . Colon cancer Neg Hx   . Esophageal cancer Neg Hx   . Pancreatic cancer Neg Hx   . Kidney disease Neg Hx   . Liver disease Neg Hx    Social History  Substance Use Topics  . Smoking status: Never Smoker   . Smokeless tobacco: Never Used  . Alcohol Use: No    Review of Systems  Constitutional: Negative for fever and chills.  HENT: Negative for facial swelling and sore throat.   Respiratory: Positive for cough and shortness of breath.   Cardiovascular: Negative for chest pain and leg swelling.  Gastrointestinal: Negative for nausea, vomiting and abdominal pain.  Genitourinary: Negative for dysuria.  Musculoskeletal: Negative for back pain.  Skin: Negative for rash and wound.  Neurological: Negative for headaches.  Psychiatric/Behavioral: The patient is not nervous/anxious.       Allergies  Penicillins; Azithromycin; Cefdinir; and Doxycycline  Home Medications   Prior to Admission medications   Medication Sig Start Date End Date Taking? Authorizing Provider  albuterol (PROVENTIL HFA;VENTOLIN HFA) 108 (90 Base) MCG/ACT inhaler Inhale 2 puffs into the lungs every 6 (six) hours as needed for wheezing or shortness of breath. 06/06/15  Yes Juanito Doom, MD  albuterol (PROVENTIL) (2.5 MG/3ML) 0.083% nebulizer solution Take 3 mLs (2.5 mg total) by nebulization every 4 (four) hours as needed for wheezing or shortness of breath.  05/03/15  Yes Golden Circle, FNP  fluticasone furoate-vilanterol (BREO ELLIPTA) 100-25 MCG/INH AEPB Inhale 1 puff into the lungs daily. Reported on 05/16/2015 05/16/15  Yes Marijean Heath, NP  furosemide (LASIX) 40 MG tablet Take 1 tablet (40 mg total) by mouth daily. Patient taking differently: Take 40 mg by mouth 2 (two) times daily.  06/27/15  Yes Volanda Napoleon, MD  ibuprofen (ADVIL,MOTRIN) 200 MG tablet Take 400 mg by mouth every 6 (six) hours as needed for moderate pain.    Yes Historical  Provider, MD  ibuprofen (ADVIL,MOTRIN) 800 MG tablet Take 800 mg by mouth every 8 (eight) hours as needed for moderate pain.    Yes Historical Provider, MD  levofloxacin (LEVAQUIN) 500 MG tablet Take 1 tablet (500 mg total) by mouth daily. 07/31/15  Yes Pattricia Boss, MD  pantoprazole (PROTONIX) 40 MG tablet Take 40 mg by mouth daily.   Yes Historical Provider, MD  potassium chloride SA (K-DUR,KLOR-CON) 20 MEQ tablet Take 20 mEq by mouth daily.   Yes Historical Provider, MD  spironolactone (ALDACTONE) 25 MG tablet Take 25 mg by mouth 2 (two) times daily.   Yes Historical Provider, MD  acetaminophen-codeine (TYLENOL #3) 300-30 MG tablet Take 1 tablet by mouth every 6 (six) hours as needed for moderate pain. Patient not taking: Reported on 07/31/2015 07/27/15   Golden Circle, FNP  ALPRAZolam Duanne Moron) 0.5 MG tablet Take 0.5 mg by mouth 2 (two) times daily as needed for anxiety.     Historical Provider, MD  OXYGEN Inhale 4.5-5 L into the lungs continuous.     Historical Provider, MD  propranolol (INDERAL) 40 MG tablet Take 1 tablet (40 mg total) by mouth 2 (two) times daily. Patient not taking: Reported on 07/31/2015 06/05/15   Larey Dresser, MD   BP 110/74 mmHg  Pulse 89  Temp(Src) 98.2 F (36.8 C) (Oral)  Resp 17  SpO2 98% Physical Exam  Constitutional: He appears well-developed and well-nourished. No distress.  HENT:  Head: Normocephalic and atraumatic.  Mouth/Throat: Oropharynx is clear and moist. No oropharyngeal exudate.  Eyes: Conjunctivae are normal. Pupils are equal, round, and reactive to light. Right eye exhibits no discharge. Left eye exhibits no discharge. No scleral icterus.  Neck: Normal range of motion. Neck supple. No thyromegaly present.  Cardiovascular: Normal rate, regular rhythm, normal heart sounds and intact distal pulses.  Exam reveals no gallop and no friction rub.   No murmur heard. Pulmonary/Chest: Effort normal. No stridor. No respiratory distress. He has no  decreased breath sounds (lower lung fields). He has no wheezes. He has no rales.  Abdominal: Soft. Bowel sounds are normal. He exhibits no distension. There is no tenderness. There is no rebound and no guarding.  Musculoskeletal: He exhibits no edema.  Lymphadenopathy:    He has no cervical adenopathy.  Neurological: He is alert. Coordination normal.  Skin: Skin is warm and dry. No rash noted. He is not diaphoretic. No pallor.  Psychiatric: He has a normal mood and affect.  Nursing note and vitals reviewed.   ED Course  Procedures (including critical care time) Labs Review Labs Reviewed  CBC WITH DIFFERENTIAL/PLATELET - Abnormal; Notable for the following:    RBC 3.94 (*)    MCV 106.9 (*)    MCH 38.1 (*)    Platelets 98 (*)    All other components within normal limits  COMPREHENSIVE METABOLIC PANEL - Abnormal; Notable for the following:    Calcium 8.3 (*)  Total Protein 5.7 (*)    Albumin 2.5 (*)    AST 95 (*)    Total Bilirubin 1.9 (*)    All other components within normal limits  BRAIN NATRIURETIC PEPTIDE  I-STAT TROPOININ, ED    Imaging Review Dg Chest 2 View  08/02/2015  CLINICAL DATA:  50 year old male with shortness of breath and COPD EXAM: CHEST  2 VIEW COMPARISON:  Radiograph dated 07/31/2015 FINDINGS: Two views of the chest demonstrates emphysematous changes of the lungs. An area of increased density at the lung base on the lateral projection appears stable compared to prior study and may be artifactual or represent scarring. There is no focal consolidation, pleural effusion, or pneumothorax. The cardiac silhouette is within normal limits. No acute osseous pathology identified. IMPRESSION: No acute cardiopulmonary process. Emphysema. Electronically Signed   By: Anner Crete M.D.   On: 08/02/2015 21:25   Ct Angio Chest Pe W/cm &/or Wo Cm  08/02/2015  CLINICAL DATA:  Shortness of breath, dyspnea with exertion, cough and fever. Home oxygen. Diagnosed with bronchitis  on 05/16. Mid chest pressure. PleurX catheter placed a few weeks ago. Recurrent pleural effusion secondary to cirrhosis. EXAM: CT ANGIOGRAPHY CHEST WITH CONTRAST TECHNIQUE: Multidetector CT imaging of the chest was performed using the standard protocol during bolus administration of intravenous contrast. Multiplanar CT image reconstructions and MIPs were obtained to evaluate the vascular anatomy. CONTRAST:  100 mL Isovue 370 COMPARISON:  07/29/2015 FINDINGS: Technically adequate study with moderately good opacification of the central and proximal segmental pulmonary arteries. Contrast bolus limits evaluation of the more peripheral pulmonary arteries. Visualized pulmonary arteries demonstrate no focal filling defect suggesting no evidence of significant pulmonary embolus. Normal heart size. Normal caliber thoracic aorta. No evidence of aortic dissection. Great vessel origins are patent. Esophagus is decompressed. Distal esophageal wall appears thickened which may indicate reflux disease. Scattered lymph nodes in the mediastinum are not pathologically enlarged. Moderate size right pleural effusion with chest tube in place. There is fluid in the soft tissues of the right lateral chest muscles near the area of catheter insertion. This suggests possible catheter oblique each into the soft tissues. Atelectasis in the right lung base. Evaluation of lungs is limited due to respiratory motion artifact but there appears to be a mild interstitial pattern suggesting mild edema. Airways appear patent. Included portions of the upper abdominal organs demonstrate changes of hepatic cirrhosis with splenic enlargement and diffuse upper abdominal varices. Upper abdominal ascites is present. Degenerative changes in the spine. No destructive bone lesions. Review of the MIP images confirms the above findings. IMPRESSION: No evidence of significant pulmonary embolus to the level of the proximal segmental arteries. Right pleural effusion  with chest tube in place. Fluid in the right lateral chest wall may indicate leakage from the chest tube. Atelectasis in the right lung base. Mild interstitial pattern to the lungs suggesting edema. 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 Abdominal ascites. Distal esophageal wall thickening without proximal dilatation may indicate reflux disease. Electronically Signed   By: Lucienne Capers M.D.   On: 08/02/2015 23:32   I have personally reviewed and evaluated these images and lab results as part of my medical decision-making.   EKG Interpretation   Date/Time:  Thursday Aug 02 2015 21:11:54 EDT Ventricular Rate:  63 PR Interval:  175 QRS Duration: 94 QT Interval:  425 QTC Calculation: 435 R Axis:   44 Text Interpretation:  Sinus rhythm Confirmed by  Johnney Killian, MD, Jeannie Done  [phone]) on 08/02/2015 11:08:12 PM Also confirmed by Johnney Killian, MD, Jeannie Done  [phone]), editor WALKER, CCT, [place] ([place])  on 08/03/2015 7:20:43 AM      MDM   EKG shows NSR. CBC unchanged from past. CMP shows calcium 8.3, protein 5.7, albumin 2.5, AST 95, total bilirubin 1.9. BNP 25.5. Troponin 0.00. Chest x-ray shows no acute cardiopulmonary process; emphysema. CTA shows no evidence of significant pulmonary embolus to the level of the proximal segmental arteries; the right pleural effusion with chest tube in place; fluid in the right lateral chest wall may indicate leakage from the chest tube; atelectasis in the right lung base; mild interstitial pattern to the suggesting edema; changes of hepatic cirrhosis with portal venous hypertension including splenic enlargement and multiple upper abdominal varices; abdominal ascites; distal esophageal wall thickening without proximal dilatation may indicate reflux disease. Patient to be discharged home with follow-up with surgeon. Oxygen saturations consistently above 90% on 5 L, which is patient's baseline  O2 usage at home. Patient to continue Levaquin prescribed prior. Patient also evaluated by Dr. Regenia Skeeter who is in agreement with plan. Patient discharged in satisfactory condition.  Final diagnoses:  Shortness of breath     Frederica Kuster, PA-C 08/04/15 [id number]  Sherwood Gambler, MD 08/04/15 [phone]

## 2015-08-02 NOTE — Telephone Encounter (Signed)
Error

## 2015-08-02 NOTE — Progress Notes (Signed)
Subjective:    Patient ID: Roy Case, male    DOB: [birth year], 50 y.o.   MRN: [id number]   Roy Case is a 50 y.o. male who presents today for an acute visit.    HPI Comments: Patient here for bronchitis which he states is not improving. Dry cough worse 'all the time'. Endorses chills, bodyaches, substernal chest pain, HA. Denies numbness or tingling radiating to left arm or jaw, palpitations,  frequent headaches, changes in vision.  Wears oxygen at home, 5L. Was seen in the emergency room 2 days ago ago and diagnosed with pneumonia. Currently being treated with Levaquin.  History of effusion and has a pleural catheter in place for months, drained 400cc's yesterday by surgeon.   History of asthma, allergies, pneumonia, chronic respiratory failure, cirrhosis.  Follows a pulmonology he states his last note he does not believe patient has COPD.  Has appt with liver specialist regarding liver transplant.   Past Medical History  Diagnosis Date  . Hypertension   . Allergic rhinitis   . Asthma   . High cholesterol   . Cirrhosis ([facility])   . 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 History of home oxygen therapy     4 liters all the time   Allergies: Penicillins; Azithromycin; Cefdinir; and Doxycycline Current Outpatient Prescriptions on File Prior to Visit  Medication Sig Dispense Refill  . acetaminophen-codeine (TYLENOL #3) 300-30 MG tablet Take 1 tablet by mouth every 6 (six) hours as needed for moderate pain. (Patient not taking: Reported on 07/31/2015) 56 tablet 0  . albuterol (PROVENTIL HFA;VENTOLIN HFA) 108 (90 Base) MCG/ACT inhaler Inhale 2 puffs into the lungs every 6 (six) hours as needed for wheezing or shortness of breath. 1 Inhaler 3  . albuterol (PROVENTIL) (2.5 MG/3ML) 0.083% nebulizer solution Take 3 mLs (2.5 mg total) by nebulization every 4 (four) hours as needed for wheezing or shortness of breath. 75 mL 2  . ALPRAZolam (XANAX) 0.5 MG tablet Take 0.5 mg by  mouth 2 (two) times daily as needed for anxiety.     . fluticasone furoate-vilanterol (BREO ELLIPTA) 100-25 MCG/INH AEPB Inhale 1 puff into the lungs daily. Reported on 05/16/2015 28 each 1  . furosemide (LASIX) 40 MG tablet Take 1 tablet (40 mg total) by mouth daily. (Patient taking differently: Take 40 mg by mouth 2 (two) times daily. ) 30 tablet 3  . ibuprofen (ADVIL,MOTRIN) 200 MG tablet Take 200 mg by mouth every 6 (six) hours as needed for moderate pain.    Marland Kitchen. ibuprofen (ADVIL,MOTRIN) 800 MG tablet Take 800 mg by mouth 3 (three) times daily.    Marland Kitchen. levofloxacin (LEVAQUIN) 500 MG tablet Take 1 tablet (500 mg total) by mouth daily. 7 tablet 0  . OXYGEN Inhale 4.5-5 L into the lungs continuous.     . pantoprazole (PROTONIX) 40 MG tablet Take 40 mg by mouth daily.    . potassium chloride SA (K-DUR,KLOR-CON) 20 MEQ tablet Take 20 mEq by mouth daily.    . propranolol (INDERAL) 40 MG tablet Take 1 tablet (40 mg total) by mouth 2 (two) times daily. (Patient not taking: Reported on 07/31/2015) 180 tablet 3  . sertraline (ZOLOFT) 25 MG tablet Take 25 mg by mouth daily as needed (anxiety).     Marland Kitchen. spironolactone (ALDACTONE) 25 MG tablet Take 25 mg by mouth 2 (two) times daily.     No current facility-administered medications on file prior to visit.    Social History  Substance Use Topics  .  Smoking status: Never Smoker   . Smokeless tobacco: Never Used  . Alcohol Use: No    Review of Systems  Constitutional: Positive for chills. Negative for fever.  HENT: Positive for voice change. Negative for ear pain, rhinorrhea, sinus pressure and sore throat.   Respiratory: Positive for cough and shortness of breath. Negative for wheezing.   Cardiovascular: Negative for chest pain and palpitations.  Gastrointestinal: Negative for nausea, vomiting and diarrhea.  Musculoskeletal: Negative for myalgias.  Neurological: Positive for dizziness and headaches.      Objective:    BP 128/82 mmHg  Pulse 89   Temp(Src) 98.5 F (36.9 C) (Oral)  Resp 20  Wt 176 lb (79.833 kg)  SpO2 92%   Physical Exam  Constitutional: Vital signs are normal. He appears well-developed and well-nourished.  HENT:  Head: Normocephalic and atraumatic.  Right Ear: Hearing, tympanic membrane, external ear and ear canal normal. No drainage, swelling or tenderness. Tympanic membrane is not injected, not erythematous and not bulging. No middle ear effusion. No decreased hearing is noted.  Left Ear: Hearing, tympanic membrane, external ear and ear canal normal. No drainage, swelling or tenderness. Tympanic membrane is not injected, not erythematous and not bulging.  No middle ear effusion. No decreased hearing is noted.  Nose: Nose normal. Right sinus exhibits no maxillary sinus tenderness and no frontal sinus tenderness. Left sinus exhibits no maxillary sinus tenderness and no frontal sinus tenderness.  Mouth/Throat: Uvula is midline, oropharynx is clear and moist and mucous membranes are normal. No oropharyngeal exudate, posterior oropharyngeal edema, posterior oropharyngeal erythema or tonsillar abscesses.  Eyes: Conjunctivae are normal.  Cardiovascular: Regular rhythm and normal heart sounds.   Pulmonary/Chest: Effort normal. No respiratory distress. He has decreased breath sounds. He has no wheezes. He has no rhonchi. He has no rales.    Decreased breath sounds across entire lung field.  Dressing clean dry and intact over right pleural catheter.  Lymphadenopathy:       Head (right side): No submental, no submandibular, no tonsillar, no preauricular, no posterior auricular and no occipital adenopathy present.       Head (left side): No submental, no submandibular, no tonsillar, no preauricular, no posterior auricular and no occipital adenopathy present.    He has no cervical adenopathy.  Neurological: He is alert.  Skin: Skin is warm and dry.  Psychiatric: He has a normal mood and affect. His speech is normal and  behavior is normal.  Vitals reviewed.      Assessment & Plan:   1. Cough On 6L in our office to achieve SaO2 92%. I could barely hear any breath sounds and overall Roy Case doesn't look well. He hasn't shown any improvement on Levaquin.   I'm concerned that he may worsen quickly, and with his history of respiratory failure. He also endorses substernal chest pain.  I advised patient to go to [facility] emergency room. He agreed and his cousin offered to drive him there.   I am having Roy Case maintain his sertraline, ALPRAZolam, OXYGEN, albuterol, fluticasone furoate-vilanterol, propranolol, albuterol, furosemide, spironolactone, potassium chloride SA, pantoprazole, ibuprofen, acetaminophen-codeine, ibuprofen, and levofloxacin.   No orders of the defined types were placed in this encounter.     Start medications as prescribed and explained to patient on After Visit Summary ( AVS). Risks, benefits, and alternatives of the medications and treatment plan prescribed today were discussed, and patient expressed understanding.   Education regarding symptom management and diagnosis given to patient.   Follow-up:Plan follow-up  as discussed or as needed if any worsening symptoms or change in condition.   Continue to follow with Jeanine Luz, FNP for routine health maintenance.   Roy Bern Routon and I agreed with plan.   Rennie Plowman, FNP

## 2015-08-02 NOTE — ED Notes (Signed)
Pt being sent by PCP.  C/o SOB and dyspnea w/ exertion.  Denies pain.  Home O2 increased to 6L .  Pt was diagnosed w/ bronchitis on 5/16.

## 2015-08-02 NOTE — Patient Instructions (Signed)
Roy Case.

## 2015-08-02 NOTE — ED Notes (Signed)
Patient transported to CT 

## 2015-08-03 ENCOUNTER — Ambulatory Visit: Payer: BLUE CROSS/BLUE SHIELD | Admitting: Family

## 2015-08-03 NOTE — ED Notes (Signed)
[facility] called for transport home. 

## 2015-08-03 NOTE — ED Notes (Signed)
PA at bedside.

## 2015-08-03 NOTE — Discharge Instructions (Signed)
Your CT scan showed a possible leak from your chest tube. Please call your surgeon for further evaluation and treatment. Continue to take your Levaquin at home. Please return to emergency department if you develop any new or worsening symptoms.   Shortness of Breath Shortness of breath means you have trouble breathing. It could also mean that you have a medical problem. You should get immediate medical care for shortness of breath. CAUSES   Not enough oxygen in the air such as with high altitudes or a smoke-filled room.  Certain lung diseases, infections, or problems.  Heart disease or conditions, such as angina or heart failure.  Low red blood cells (anemia).  Poor physical fitness, which can cause shortness of breath when you exercise.  Chest or back injuries or stiffness.  Being overweight.  Smoking.  Anxiety, which can make you feel like you are not getting enough air. DIAGNOSIS  Serious medical problems can often be found during your physical exam. Tests may also be done to determine why you are having shortness of breath. Tests may include:  Chest X-rays.  Lung function tests.  Blood tests.  An electrocardiogram (ECG).  An ambulatory electrocardiogram. An ambulatory ECG records your heartbeat patterns over a 24-hour period.  Exercise testing.  A transthoracic echocardiogram (TTE). During echocardiography, sound waves are used to evaluate how blood flows through your heart.  A transesophageal echocardiogram (TEE).  Imaging scans. Your health care provider may not be able to find a cause for your shortness of breath after your exam. In this case, it is important to have a follow-up exam with your health care provider as directed.  TREATMENT  Treatment for shortness of breath depends on the cause of your symptoms and can vary greatly. HOME CARE INSTRUCTIONS   Do not smoke. Smoking is a common cause of shortness of breath. If you smoke, ask for help to  quit.  Avoid being around chemicals or things that may bother your breathing, such as paint fumes and dust.  Rest as needed. Slowly resume your usual activities.  If medicines were prescribed, take them as directed for the full length of time directed. This includes oxygen and any inhaled medicines.  Keep all follow-up appointments as directed by your health care provider. SEEK MEDICAL CARE IF:   Your condition does not improve in the time expected.  You have a hard time doing your normal activities even with rest.  You have any new symptoms. SEEK IMMEDIATE MEDICAL CARE IF:   Your shortness of breath gets worse.  You feel light-headed, faint, or develop a cough not controlled with medicines.  You start coughing up blood.  You have pain with breathing.  You have chest pain or pain in your arms, shoulders, or abdomen.  You have a fever.  You are unable to walk up stairs or exercise the way you normally do. MAKE SURE YOU:  Understand these instructions.  Will watch your condition.  Will get help right away if you are not doing well or get worse.   This information is not intended to replace advice given to you by your health care provider. Make sure you discuss any questions you have with your health care provider.   Document Released: 11/26/2000 Document Revised: 03/08/2013 Document Reviewed: 05/19/2011 Elsevier Interactive Patient Education [employer]2016 [employer] Inc[employer]

## 2015-08-03 NOTE — ED Notes (Signed)
Pt given [place] sandwich, applesauce, cheese, and ginger ale

## 2015-08-05 LAB — CULTURE, BLOOD (ROUTINE X 2)
CULTURE: NO GROWTH
Culture: NO GROWTH

## 2015-08-10 ENCOUNTER — Inpatient Hospital Stay: Payer: BLUE CROSS/BLUE SHIELD | Admitting: Family

## 2015-08-14 ENCOUNTER — Ambulatory Visit: Payer: BLUE CROSS/BLUE SHIELD | Admitting: Pulmonary Disease

## 2015-08-15 ENCOUNTER — Ambulatory Visit (INDEPENDENT_AMBULATORY_CARE_PROVIDER_SITE_OTHER): Payer: BLUE CROSS/BLUE SHIELD | Admitting: Family

## 2015-08-15 ENCOUNTER — Encounter: Payer: Self-pay | Admitting: Family

## 2015-08-15 VITALS — BP 128/84 | HR 99 | Temp 98.4°F | Resp 16 | Ht 69.0 in | Wt 176.0 lb

## 2015-08-15 DIAGNOSIS — K7460 Unspecified cirrhosis of liver: Secondary | ICD-10-CM | POA: Diagnosis not present

## 2015-08-15 DIAGNOSIS — R600 Localized edema: Secondary | ICD-10-CM

## 2015-08-15 NOTE — Patient Instructions (Signed)
Thank you for choosing [facility].  Summary/Instructions:  Please continue to take your medications as prescribed.   The cause of your symptoms is most likely the fluid and inability to drain from your chest tube.  Follow up with general surgery as scheduled.  If you have worsening shortness of breath or the edema worsens please go to the ED.  If your symptoms worsen or fail to improve, please contact our office for further instruction, or in case of emergency go directly to the emergency room at the closest medical facility.    Ascites Ascites is a collection of excess fluid in the abdomen. Ascites can range from mild to severe. It can get worse without treatment. CAUSES Possible causes include:  Cirrhosis. This is the most common cause of ascites.  Infection or inflammation in the abdomen.  Cancer in the abdomen.  Heart failure.  Kidney disease.  Inflammation of the pancreas.  Clots in the veins of the liver. SIGNS AND SYMPTOMS Signs and symptoms may include:  A feeling of fullness in your abdomen. This is common.  An increase in the size of your abdomen or your waist.  Swelling in your legs.  Swelling of the scrotum in men.  Difficulty breathing.  Abdominal pain.  Sudden weight gain. If the condition is mild, you may not have symptoms. DIAGNOSIS To make a diagnosis, your health care provider will:  Ask about your medical history.  Perform a physical exam.  Order imaging tests, such as an ultrasound or CT scan of your abdomen. TREATMENT Treatment depends on the cause of the ascites. It may include:  Taking a pill to make you urinate. This is called a water pill (diuretic pill).  Strictly reducing your salt (sodium) intake. Salt can cause extra fluid to be kept in the body, and this makes ascites worse.  Having a procedure to remove fluid from your abdomen (paracentesis).  Having a procedure to transfer fluid from your abdomen into a  vein.  Having a procedure that connects two of the major veins within your liver and relieves pressure on your liver (TIPS procedure). Ascites may go away or improve with treatment of the condition that caused it.  HOME CARE INSTRUCTIONS  Keep track of your weight. To do this, weigh yourself at the same time every day and record your weight.  Keep track of how much you drink and any changes in the amount you urinate.  Follow any instructions that your health care provider gives you about how much to drink.  Try not to eat salty (high-sodium) foods.  Take medicines only as directed by your health care provider.  Keep all follow-up visits as directed by your health care provider. This is important.  Report any changes in your health to your health care provider, especially if you develop new symptoms or your symptoms get worse. SEEK MEDICAL CARE IF:  Your gain more than 3 pounds in 3 days.  Your abdominal size or your waist size increases.  You have new swelling in your legs.  The swelling in your legs gets worse. SEEK IMMEDIATE MEDICAL CARE IF:  You develop a fever.  You develop confusion.  You develop new or worsening difficulty breathing.  You develop new or worsening abdominal pain.  You develop new or worsening swelling in the scrotum (in men).   This information is not intended to replace advice given to you by your health care provider. Make sure you discuss any questions you have with your health  care provider.   Document Released: 03/03/2005 Document Revised: 03/24/2014 Document Reviewed: 09/30/2013 Elsevier Interactive Patient Education [employer]

## 2015-08-15 NOTE — Progress Notes (Signed)
Subjective:    Patient ID: Roy Case, male    DOB: 11-27-1965, 50 y.o.   MRN: [id number]  Chief Complaint  Patient presents with  . Hospitalization Follow-up    bilateral feet swelling that is concerning to him, has a severe UTI    HPI:  Roy Case is a 50 y.o. male who  has a past medical history of Hypertension; Allergic rhinitis; Asthma; High cholesterol; Cirrhosis ([place]); Idiopathic hemochromatosis ([place]) (06/04/2015); and History of home oxygen therapy. and presents today for a follow up office visit.   Recently evaluated in the office and emergency department for progressive shortness of breath and cough. Treated with Levaquin for bronchitis. Continue to experience the associated symptoms of dry cough, shortness of breath with exertion and at rest with fatigue, chills, and sweats. Has an indwelling catheter for pleural effusion with 4-500 mL of fluid removed 3 days per week. EKG showed sinus rhythm. Blood work with CBC and change from the past. Complete metabolic panel showed calcium of 8.3, protein 5.7, albumin 2.5, AST 95, bilirubin 1.9 and BNP of 25. Troponins were negative. Chest x-ray with no acute cardiopulmonary process. CTA shows no evidence of significant pulmonary emboli to the level of the proximal segmental arteries. Atelectasis was noted in the right lung base. He was advised to continue the current dosage of Levaquin for the urinary tract infection as well as follow-up with surgeon at [facility] regarding his chest tube as there was evidence of leakage in his chest wall. All ED records, labs and imaging were reviewed in detail.   Since leaving the emergency department he has continue to experience shortness of breath and increased bilateral lower extremity edema. His home health nurse has been to drain his chest tube with removal of fluid at 600 ml, 600 ml and 250 ml. Following the weekend there was only removal of 100 ml and most recently 10 ml today. He has been on 5 L  via nasal cannula and noted gradually decreasing oxygen levels during his office visit. Also notes that when connect to the suction unit to remove fluid he experienced a sharp pain in his chest. Has been work with Dr. Corena Pilgrim of General Surgery out of [facility] and has a follow up appointment on 07/23/15. He continues to take his Levaquin as prescribed and denies adverse side effects or myalgias.    Allergies  Allergen Reactions  . Penicillins Anaphylaxis and Other (See Comments)    Has patient had a PCN reaction causing immediate rash, facial/tongue/throat swelling, SOB or lightheadedness with hypotension: YES Has patient had a PCN reaction causing severe rash involving mucus membranes or skin necrosis:NO Has patient had a PCN reaction occurring within the last 10 years: NO If all of the above answers are "NO", then may proceed with Cephalosporin use.  . Azithromycin Other (See Comments)    Blisters on mouth  . Cefdinir     Blisters in mouth   . Doxycycline Rash     Current Outpatient Prescriptions on File Prior to Visit  Medication Sig Dispense Refill  . acetaminophen-codeine (TYLENOL #3) 300-30 MG tablet Take 1 tablet by mouth every 6 (six) hours as needed for moderate pain. 56 tablet 0  . albuterol (PROVENTIL HFA;VENTOLIN HFA) 108 (90 Base) MCG/ACT inhaler Inhale 2 puffs into the lungs every 6 (six) hours as needed for wheezing or shortness of breath. 1 Inhaler 3  . albuterol (PROVENTIL) (2.5 MG/3ML) 0.083% nebulizer solution Take 3 mLs (2.5 mg total) by  nebulization every 4 (four) hours as needed for wheezing or shortness of breath. 75 mL 2  . ALPRAZolam (XANAX) 0.5 MG tablet Take 0.5 mg by mouth 2 (two) times daily as needed for anxiety.     . fluticasone furoate-vilanterol (BREO ELLIPTA) 100-25 MCG/INH AEPB Inhale 1 puff into the lungs daily. Reported on 05/16/2015 28 each 1  . furosemide (LASIX) 40 MG tablet Take 1 tablet (40 mg total) by mouth daily. (Patient taking  differently: Take 40 mg by mouth 2 (two) times daily. ) 30 tablet 3  . ibuprofen (ADVIL,MOTRIN) 200 MG tablet Take 400 mg by mouth every 6 (six) hours as needed for moderate pain.     Marland Kitchen ibuprofen (ADVIL,MOTRIN) 800 MG tablet Take 800 mg by mouth every 8 (eight) hours as needed for moderate pain.     Marland Kitchen levofloxacin (LEVAQUIN) 500 MG tablet Take 1 tablet (500 mg total) by mouth daily. 7 tablet 0  . OXYGEN Inhale 4.5-5 L into the lungs continuous.     . pantoprazole (PROTONIX) 40 MG tablet Take 40 mg by mouth daily.    . potassium chloride SA (K-DUR,KLOR-CON) 20 MEQ tablet Take 20 mEq by mouth daily.    . propranolol (INDERAL) 40 MG tablet Take 1 tablet (40 mg total) by mouth 2 (two) times daily. 180 tablet 3  . spironolactone (ALDACTONE) 25 MG tablet Take 25 mg by mouth 2 (two) times daily.     No current facility-administered medications on file prior to visit.     Past Surgical History  Procedure Laterality Date  . Cholecystectomy  may 2014  . Skin cancer excision Left     arm  . Carpal tunnel release Right   . Cardiac catheterization N/A 05/31/2015    Procedure: Right Heart Cath;  Surgeon: Larey Dresser, MD;  Location: [facility];  Service: Cardiovascular;  Laterality: N/A;  . Bone marrow biopsy  05-30-15     Review of Systems  Constitutional: Negative for fever and chills.  Respiratory: Positive for shortness of breath. Negative for chest tightness and wheezing.   Cardiovascular: Positive for leg swelling. Negative for chest pain and palpitations.      Objective:    BP 128/84 mmHg  Pulse 99  Temp(Src) 98.4 F (36.9 C) (Oral)  Resp 16  Ht '5\' 9"'  (1.753 m)  Wt 176 lb (79.833 kg)  BMI 25.98 kg/m2  SpO2 84% Nursing note and vital signs reviewed.  Wt Readings from Last 3 Encounters:  08/15/15 176 lb (79.833 kg)  08/02/15 176 lb (79.833 kg)  07/31/15 169 lb (76.658 kg)    Physical Exam  Constitutional: He is oriented to person, place, and time. He appears  well-developed and well-nourished. No distress.  Seated in the chair with oxygen in place via nasal cannula. Dressed appropriately. Appears with increased fluid in his bilateral lower extremities and abdomen.   Cardiovascular: Normal rate, regular rhythm, normal heart sounds and intact distal pulses.   Pulmonary/Chest: Effort normal and breath sounds normal.  Neurological: He is alert and oriented to person, place, and time.  Skin: Skin is warm and dry.  Psychiatric: He has a normal mood and affect. His behavior is normal. Judgment and thought content normal.       Assessment & Plan:   Problem List Items Addressed This Visit      Digestive   Hepatic cirrhosis ([place])    Current symptoms with concern from hepatic cirrhosis and development of ascites with inability to remove fluid  from chest tube secondary to possible displacement or dysfunction. Recommend follow-up with general surgery and/or seek emergency care if symptoms of fluid overload or shortness of breath worsen. Continue follow-up with liver specialist as scheduled. Will refer to general surgery per patient family request.      Relevant Orders   Ambulatory referral to General Surgery     Other   Lower extremity edema - Primary    Bilateral lower extremity edema with concern for hepatic origin and inability to drain from chest tube with prior noting of chest tube leakage into the chest wall. Encourage continued use of current dosage of furosemide, however given most recent urinary tract infection fluid management may be challenging. Recommend follow-up with general surgery if symptoms continue to worsen or shortness of breath develops. Elevating legs or lying flat may bring additional fluid resulting in possible increased shortness of breath. Encouraged to elevate his legs with caution.      Relevant Orders   Ambulatory referral to General Surgery       I am having Mr. Roddy maintain his ALPRAZolam, OXYGEN, albuterol,  fluticasone furoate-vilanterol, propranolol, albuterol, furosemide, spironolactone, potassium chloride SA, pantoprazole, ibuprofen, acetaminophen-codeine, ibuprofen, and levofloxacin.   Follow-up: Return in about 1 month (around 09/14/2015), or if symptoms worsen or fail to improve.  Mauricio Po, FNP

## 2015-08-15 NOTE — Assessment & Plan Note (Addendum)
Bilateral lower extremity edema with concern for hepatic origin and inability to drain from chest tube with prior noting of chest tube leakage into the chest wall. Encourage continued use of current dosage of furosemide, however given most recent urinary tract infection fluid management may be challenging. Recommend follow-up with general surgery if symptoms continue to worsen or shortness of breath develops. Elevating legs or lying flat may bring additional fluid resulting in possible increased shortness of breath. Encouraged to elevate his legs with caution.

## 2015-08-15 NOTE — Assessment & Plan Note (Signed)
Current symptoms with concern from hepatic cirrhosis and development of ascites with inability to remove fluid from chest tube secondary to possible displacement or dysfunction. Recommend follow-up with general surgery and/or seek emergency care if symptoms of fluid overload or shortness of breath worsen. Continue follow-up with liver specialist as scheduled. Will refer to general surgery per patient family request.

## 2015-08-18 ENCOUNTER — Observation Stay (HOSPITAL_COMMUNITY)
Admission: EM | Admit: 2015-08-18 | Discharge: 2015-08-21 | Disposition: A | Discharge status: Home / Self Care | Payer: BLUE CROSS/BLUE SHIELD | Attending: Internal Medicine | Admitting: Internal Medicine

## 2015-08-18 ENCOUNTER — Emergency Department (HOSPITAL_COMMUNITY): Payer: BLUE CROSS/BLUE SHIELD

## 2015-08-18 ENCOUNTER — Encounter (HOSPITAL_COMMUNITY): Payer: Self-pay

## 2015-08-18 DIAGNOSIS — R740 Nonspecific elevation of levels of transaminase and lactic acid dehydrogenase [LDH]: Secondary | ICD-10-CM | POA: Insufficient documentation

## 2015-08-18 DIAGNOSIS — R140 Abdominal distension (gaseous): Secondary | ICD-10-CM | POA: Diagnosis not present

## 2015-08-18 DIAGNOSIS — I10 Essential (primary) hypertension: Secondary | ICD-10-CM | POA: Diagnosis not present

## 2015-08-18 DIAGNOSIS — Z9981 Dependence on supplemental oxygen: Secondary | ICD-10-CM | POA: Insufficient documentation

## 2015-08-18 DIAGNOSIS — K7460 Unspecified cirrhosis of liver: Secondary | ICD-10-CM | POA: Diagnosis not present

## 2015-08-18 DIAGNOSIS — K769 Liver disease, unspecified: Secondary | ICD-10-CM

## 2015-08-18 DIAGNOSIS — Z79899 Other long term (current) drug therapy: Secondary | ICD-10-CM | POA: Insufficient documentation

## 2015-08-18 DIAGNOSIS — R042 Hemoptysis: Secondary | ICD-10-CM | POA: Diagnosis not present

## 2015-08-18 DIAGNOSIS — D696 Thrombocytopenia, unspecified: Secondary | ICD-10-CM | POA: Diagnosis not present

## 2015-08-18 DIAGNOSIS — R188 Other ascites: Secondary | ICD-10-CM | POA: Diagnosis not present

## 2015-08-18 DIAGNOSIS — N390 Urinary tract infection, site not specified: Secondary | ICD-10-CM | POA: Diagnosis not present

## 2015-08-18 DIAGNOSIS — J9611 Chronic respiratory failure with hypoxia: Secondary | ICD-10-CM | POA: Diagnosis present

## 2015-08-18 DIAGNOSIS — R1084 Generalized abdominal pain: Secondary | ICD-10-CM

## 2015-08-18 DIAGNOSIS — Z791 Long term (current) use of non-steroidal anti-inflammatories (NSAID): Secondary | ICD-10-CM | POA: Insufficient documentation

## 2015-08-18 DIAGNOSIS — J90 Pleural effusion, not elsewhere classified: Secondary | ICD-10-CM

## 2015-08-18 DIAGNOSIS — E872 Acidosis: Secondary | ICD-10-CM

## 2015-08-18 DIAGNOSIS — Z7951 Long term (current) use of inhaled steroids: Secondary | ICD-10-CM | POA: Diagnosis not present

## 2015-08-18 DIAGNOSIS — J918 Pleural effusion in other conditions classified elsewhere: Secondary | ICD-10-CM | POA: Diagnosis present

## 2015-08-18 LAB — CBC WITH DIFFERENTIAL/PLATELET
BASOS PCT: 1 %
Basophils Absolute: 0 10*3/uL (ref 0.0–0.1)
EOS ABS: 0.1 10*3/uL (ref 0.0–0.7)
Eosinophils Relative: 4 %
HCT: 39.9 % (ref 39.0–52.0)
HEMOGLOBIN: 13.9 g/dL (ref 13.0–17.0)
LYMPHS ABS: 1.1 10*3/uL (ref 0.7–4.0)
Lymphocytes Relative: 27 %
MCH: 36.9 pg — AB (ref 26.0–34.0)
MCHC: 34.8 g/dL (ref 30.0–36.0)
MCV: 105.8 fL — ABNORMAL HIGH (ref 78.0–100.0)
Monocytes Absolute: 0.4 10*3/uL (ref 0.1–1.0)
Monocytes Relative: 10 %
NEUTROS PCT: 58 %
Neutro Abs: 2.3 10*3/uL (ref 1.7–7.7)
Platelets: 85 10*3/uL — ABNORMAL LOW (ref 150–400)
RBC: 3.77 MIL/uL — AB (ref 4.22–5.81)
RDW: 13.4 % (ref 11.5–15.5)
WBC: 4 10*3/uL (ref 4.0–10.5)

## 2015-08-18 LAB — COMPREHENSIVE METABOLIC PANEL
ALK PHOS: 64 U/L (ref 38–126)
ALT: 40 U/L (ref 17–63)
ANION GAP: 6 (ref 5–15)
AST: 88 U/L — ABNORMAL HIGH (ref 15–41)
Albumin: 2.4 g/dL — ABNORMAL LOW (ref 3.5–5.0)
BUN: 8 mg/dL (ref 6–20)
CALCIUM: 7.7 mg/dL — AB (ref 8.9–10.3)
CO2: 24 mmol/L (ref 22–32)
Chloride: 108 mmol/L (ref 101–111)
Creatinine, Ser: 0.72 mg/dL (ref 0.61–1.24)
GFR calc non Af Amer: >60 mL/min (ref >60)
Glucose, Bld: 116 mg/dL — ABNORMAL HIGH (ref 65–99)
POTASSIUM: 3.6 mmol/L (ref 3.5–5.1)
SODIUM: 138 mmol/L (ref 135–145)
Total Bilirubin: 1.3 mg/dL — ABNORMAL HIGH (ref 0.3–1.2)
Total Protein: 6.5 g/dL (ref 6.5–8.1)

## 2015-08-18 LAB — URINALYSIS, ROUTINE W REFLEX MICROSCOPIC
Bilirubin Urine: NEGATIVE
Glucose, UA: NEGATIVE mg/dL
Hgb urine dipstick: NEGATIVE
Ketones, ur: NEGATIVE mg/dL
Leukocytes, UA: NEGATIVE
NITRITE: NEGATIVE
Protein, ur: NEGATIVE mg/dL
SPECIFIC GRAVITY, URINE: 1.03 (ref 1.005–1.030)
pH: 5.5 (ref 5.0–8.0)

## 2015-08-18 LAB — I-STAT CG4 LACTIC ACID, ED
Lactic Acid, Venous: 3.1 mmol/L (ref 0.5–2.0)
Lactic Acid, Venous: 2.51 mmol/L (ref 0.5–2.0)

## 2015-08-18 LAB — D-DIMER, QUANTITATIVE: D-Dimer, Quant: 8.78 ug/mL-FEU — ABNORMAL HIGH (ref 0.00–0.50)

## 2015-08-18 MED ORDER — FLUTICASONE FUROATE-VILANTEROL 100-25 MCG/INH IN AEPB
1.0000 | INHALATION_SPRAY | Freq: Every day | RESPIRATORY_TRACT | Status: DC
  Administered 2015-08-19 – 2015-08-21 (×3): 1 via RESPIRATORY_TRACT
  Filled 2015-08-18: qty 28

## 2015-08-18 MED ORDER — VANCOMYCIN HCL IN DEXTROSE 1-5 GM/200ML-% IV SOLN
1000.0000 mg | Freq: Once | INTRAVENOUS | Status: AC
  Administered 2015-08-18: 1000 mg via INTRAVENOUS
  Filled 2015-08-18: qty 200

## 2015-08-18 MED ORDER — VANCOMYCIN HCL IN DEXTROSE 1-5 GM/200ML-% IV SOLN: 1000.0000 mg | Freq: Three times a day (TID) | INTRAVENOUS | Status: DC

## 2015-08-18 MED ORDER — SODIUM CHLORIDE 0.9 % IV BOLUS (SEPSIS)
500.0000 mL | Freq: Once | INTRAVENOUS | Status: AC
  Administered 2015-08-19: 500 mL via INTRAVENOUS

## 2015-08-18 MED ORDER — AZTREONAM 2 G IJ SOLR: 2.0000 g | Freq: Three times a day (TID) | INTRAMUSCULAR | Status: DC

## 2015-08-18 MED ORDER — ONDANSETRON HCL 4 MG/2ML IJ SOLN: 4.0000 mg | Freq: Four times a day (QID) | INTRAMUSCULAR | Status: DC | PRN

## 2015-08-18 MED ORDER — ALPRAZOLAM 0.5 MG PO TABS: 0.5000 mg | ORAL_TABLET | Freq: Two times a day (BID) | ORAL | Status: DC | PRN

## 2015-08-18 MED ORDER — POLYETHYLENE GLYCOL 3350 17 G PO PACK: 17.0000 g | PACK | Freq: Every day | ORAL | Status: DC | PRN

## 2015-08-18 MED ORDER — ALBUTEROL SULFATE (2.5 MG/3ML) 0.083% IN NEBU: 3.0000 mL | INHALATION_SOLUTION | Freq: Four times a day (QID) | RESPIRATORY_TRACT | Status: DC | PRN

## 2015-08-18 MED ORDER — BISACODYL 5 MG PO TBEC
5.0000 mg | DELAYED_RELEASE_TABLET | Freq: Every day | ORAL | Status: DC | PRN
Start: 2015-08-18 — End: 2015-08-21

## 2015-08-18 MED ORDER — HYDROMORPHONE HCL 1 MG/ML IJ SOLN: 0.5000 mg | INTRAMUSCULAR | Status: DC | PRN

## 2015-08-18 MED ORDER — PANTOPRAZOLE SODIUM 40 MG PO TBEC
40.0000 mg | DELAYED_RELEASE_TABLET | Freq: Every day | ORAL | Status: DC
  Administered 2015-08-19 – 2015-08-21 (×3): 40 mg via ORAL
  Filled 2015-08-18 (×3): qty 1

## 2015-08-18 MED ORDER — CIPROFLOXACIN HCL 500 MG PO TABS
500.0000 mg | ORAL_TABLET | Freq: Two times a day (BID) | ORAL | Status: DC
  Administered 2015-08-19: 500 mg via ORAL
  Filled 2015-08-18 (×2): qty 1

## 2015-08-18 MED ORDER — DEXTROSE 5 % IV SOLN
2.0000 g | Freq: Once | INTRAVENOUS | Status: AC
  Administered 2015-08-18: 2 g via INTRAVENOUS
  Filled 2015-08-18: qty 2

## 2015-08-18 MED ORDER — IBUPROFEN 200 MG PO TABS: 400.0000 mg | ORAL_TABLET | ORAL | Status: DC | PRN

## 2015-08-18 MED ORDER — OXYCODONE HCL 5 MG PO TABS
5.0000 mg | ORAL_TABLET | ORAL | Status: DC | PRN
  Administered 2015-08-19 – 2015-08-21 (×7): 5 mg via ORAL
  Filled 2015-08-18 (×7): qty 1

## 2015-08-18 MED ORDER — IOPAMIDOL (ISOVUE-370) INJECTION 76%
100.0000 mL | Freq: Once | INTRAVENOUS | Status: AC | PRN
  Administered 2015-08-18: 100 mL via INTRAVENOUS

## 2015-08-18 MED ORDER — ONDANSETRON HCL 4 MG PO TABS: 4.0000 mg | ORAL_TABLET | Freq: Four times a day (QID) | ORAL | Status: DC | PRN

## 2015-08-18 NOTE — ED Notes (Addendum)
Per EMS- Pt coughing up blood tonight- dark color. Currently has a drain in upper right abd. Pain every time he coughs. Initial o2 sat of 87%. 1 5mg  neb albuterol given with 125 solumedrol.

## 2015-08-18 NOTE — ED Notes (Signed)
Pt reports he has had a uti since last week.

## 2015-08-18 NOTE — ED Notes (Signed)
Bed: WA02 Expected date:  Expected time:  Means of arrival:  Comments: EMS- [id number] yo coughing up blood

## 2015-08-18 NOTE — ED Notes (Signed)
Pt has urinal 

## 2015-08-18 NOTE — ED Notes (Signed)
Lactic given to Dr. Buck MamJacobiwitz.

## 2015-08-18 NOTE — ED Provider Notes (Signed)
CSN: [id number]     Arrival date & time 08/18/15  1917 History   First MD Initiated Contact with Patient 08/18/15 2001     Chief Complaint  Patient presents with  . Hemoptysis     (Consider location/radiation/quality/duration/timing/severity/associated sxs/prior Treatment) HPI Complains of cough onset this morning with yellow sputum mixed with slight amount of blood and mild shortness of breath. Associated symptoms include vomiting 5 or 6 times and fever with temperature of 100.9 at home. No other associated symptoms. No treatment prior to coming here. He denies nausea at present. Past Medical History  Diagnosis Date  . Hypertension   . Allergic rhinitis   . Asthma   . High cholesterol   . Cirrhosis ([place])   . 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 History of home oxygen therapy     4 liters all the time  5 L Past Surgical History  Procedure Laterality Date  . Cholecystectomy  may 2014  . Skin cancer excision Left     arm  . Carpal tunnel release Right   . Cardiac catheterization N/A 05/31/2015    Procedure: Right Heart Cath;  Surgeon: Larey Dresser, MD;  Location: [facility];  Service: Cardiovascular;  Laterality: N/A;  . Bone marrow biopsy  05-30-15   Family History  Problem Relation Age of Onset  . Lung cancer Mother   . Colon cancer Neg Hx   . Esophageal cancer Neg Hx   . Pancreatic cancer Neg Hx   . Kidney disease Neg Hx   . Liver disease Neg Hx    Social History  Substance Use Topics  . Smoking status: Never Smoker   . Smokeless tobacco: Never Used  . Alcohol Use: No    Review of Systems  Constitutional: Positive for fever.  HENT: Negative.   Respiratory: Positive for cough and shortness of breath.   Cardiovascular: Negative.   Gastrointestinal: Positive for vomiting.  Musculoskeletal: Negative.   Skin: Negative.   Neurological: Negative.   Psychiatric/Behavioral: Negative.   All other systems reviewed and are negative.     Allergies   Penicillins; Acetaminophen; Azithromycin; Cefdinir; and Doxycycline  Home Medications   Prior to Admission medications   Medication Sig Start Date End Date Taking? Authorizing Provider  albuterol (PROVENTIL HFA;VENTOLIN HFA) 108 (90 Base) MCG/ACT inhaler Inhale 2 puffs into the lungs every 6 (six) hours as needed for wheezing or shortness of breath. 06/06/15  Yes Juanito Doom, MD  albuterol (PROVENTIL) (2.5 MG/3ML) 0.083% nebulizer solution Take 3 mLs (2.5 mg total) by nebulization every 4 (four) hours as needed for wheezing or shortness of breath. 05/03/15  Yes Golden Circle, FNP  ALPRAZolam Duanne Moron) 0.5 MG tablet Take 0.5 mg by mouth 2 (two) times daily as needed for anxiety.    Yes Historical Provider, MD  ciprofloxacin (CIPRO) 500 MG tablet Take 500 mg by mouth 2 (two) times daily. 08/13/15  Yes Historical Provider, MD  fluticasone furoate-vilanterol (BREO ELLIPTA) 100-25 MCG/INH AEPB Inhale 1 puff into the lungs daily. Reported on 05/16/2015 05/16/15  Yes Marijean Heath, NP  furosemide (LASIX) 40 MG tablet Take 1 tablet (40 mg total) by mouth daily. Patient taking differently: Take 40 mg by mouth 2 (two) times daily.  06/27/15  Yes Volanda Napoleon, MD  ibuprofen (ADVIL,MOTRIN) 800 MG tablet Take 800 mg by mouth every 8 (eight) hours as needed for moderate pain.    Yes Historical Provider, MD  pantoprazole (PROTONIX) 40 MG tablet Take 40  mg by mouth daily.   Yes Historical Provider, MD  potassium chloride SA (K-DUR,KLOR-CON) 20 MEQ tablet Take 20 mEq by mouth daily.   Yes Historical Provider, MD  spironolactone (ALDACTONE) 25 MG tablet Take 25 mg by mouth 2 (two) times daily.   Yes Historical Provider, MD  acetaminophen-codeine (TYLENOL #3) 300-30 MG tablet Take 1 tablet by mouth every 6 (six) hours as needed for moderate pain. Patient not taking: Reported on 08/18/2015 07/27/15   Golden Circle, FNP  levofloxacin (LEVAQUIN) 500 MG tablet Take 1 tablet (500 mg total) by mouth  daily. Patient not taking: Reported on 08/18/2015 07/31/15   Pattricia Boss, MD  OXYGEN Inhale 4.5-5 L into the lungs continuous.     Historical Provider, MD  propranolol (INDERAL) 40 MG tablet Take 1 tablet (40 mg total) by mouth 2 (two) times daily. Patient not taking: Reported on 08/18/2015 06/05/15   Larey Dresser, MD   BP 114/68 mmHg  Pulse 113  Temp(Src) 99.2 F (37.3 C) (Oral)  Resp 22  SpO2 94% Physical Exam  Constitutional: He is oriented to person, place, and time.  Chronically ill-appearing  HENT:  Head: Normocephalic and atraumatic.  Eyes: Conjunctivae are normal. Pupils are equal, round, and reactive to light.  Neck: Neck supple. No tracheal deviation present. No thyromegaly present.  Cardiovascular: Normal rate and regular rhythm.   No murmur heard. Pulmonary/Chest: Effort normal.  Scant dry crackles diffusely  Abdominal: Soft. Bowel sounds are normal. He exhibits no distension. There is no tenderness.  Musculoskeletal: Normal range of motion. He exhibits edema. He exhibits no tenderness.  Trace pretibial pitting edema bilaterally  Neurological: He is alert and oriented to person, place, and time. Coordination normal.  Skin: Skin is warm and dry. No rash noted.  Psychiatric: He has a normal mood and affect.  Nursing note and vitals reviewed.   ED Course  Procedures (including critical care time) Labs Review Labs Reviewed  CBC WITH DIFFERENTIAL/PLATELET - Abnormal; Notable for the following:    RBC 3.77 (*)    MCV 105.8 (*)    MCH 36.9 (*)    Platelets 85 (*)    All other components within normal limits  I-STAT CG4 LACTIC ACID, ED - Abnormal; Notable for the following:    Lactic Acid, Venous 3.10 (*)    All other components within normal limits  CULTURE, BLOOD (ROUTINE X 2)  CULTURE, BLOOD (ROUTINE X 2)  URINE CULTURE  COMPREHENSIVE METABOLIC PANEL  URINALYSIS, ROUTINE W REFLEX MICROSCOPIC (NOT AT [facility])    Imaging Review No results found. I have personally  reviewed and evaluated these images and lab results as part of my medical decision-making.   EKG Interpretation   Date/Time:  Saturday August 18 2015 20:46:29 EDT Ventricular Rate:  94 PR Interval:  159 QRS Duration: 99 QT Interval:  392 QTC Calculation: 490 R Axis:   26 Text Interpretation:  Sinus rhythm Borderline prolonged QT interval No  significant change since last tracing Confirmed by Winfred Leeds  MD, Zimir Kittleson  [phone]) on 08/18/2015 9:17:38 PM      Results for orders placed or performed during the hospital encounter of 08/18/15  Comprehensive metabolic panel  Result Value Ref Range   Sodium 138 135 - 145 mmol/L   Potassium 3.6 3.5 - 5.1 mmol/L   Chloride 108 101 - 111 mmol/L   CO2 24 22 - 32 mmol/L   Glucose, Bld 116 (H) 65 - 99 mg/dL   BUN 8 6 - 20  mg/dL   Creatinine, Ser 0.72 0.61 - 1.24 mg/dL   Calcium 7.7 (L) 8.9 - 10.3 mg/dL   Total Protein 6.5 6.5 - 8.1 g/dL   Albumin 2.4 (L) 3.5 - 5.0 g/dL   AST 88 (H) 15 - 41 U/L   ALT 40 17 - 63 U/L   Alkaline Phosphatase 64 38 - 126 U/L   Total Bilirubin 1.3 (H) 0.3 - 1.2 mg/dL   GFR calc non Af Amer >60 >60 mL/min   GFR calc Af Amer >60 >60 mL/min   Anion gap 6 5 - 15  CBC with Differential  Result Value Ref Range   WBC 4.0 4.0 - 10.5 K/uL   RBC 3.77 (L) 4.22 - 5.81 MIL/uL   Hemoglobin 13.9 13.0 - 17.0 g/dL   HCT 39.9 39.0 - 52.0 %   MCV 105.8 (H) 78.0 - 100.0 fL   MCH 36.9 (H) 26.0 - 34.0 pg   MCHC 34.8 30.0 - 36.0 g/dL   RDW 13.4 11.5 - 15.5 %   Platelets 85 (L) 150 - 400 K/uL   Neutrophils Relative % 58 %   Neutro Abs 2.3 1.7 - 7.7 K/uL   Lymphocytes Relative 27 %   Lymphs Abs 1.1 0.7 - 4.0 K/uL   Monocytes Relative 10 %   Monocytes Absolute 0.4 0.1 - 1.0 K/uL   Eosinophils Relative 4 %   Eosinophils Absolute 0.1 0.0 - 0.7 K/uL   Basophils Relative 1 %   Basophils Absolute 0.0 0.0 - 0.1 K/uL  Urinalysis, Routine w reflex microscopic  Result Value Ref Range   Color, Urine AMBER (A) YELLOW   APPearance CLOUDY (A)  CLEAR   Specific Gravity, Urine 1.030 1.005 - 1.030   pH 5.5 5.0 - 8.0   Glucose, UA NEGATIVE NEGATIVE mg/dL   Hgb urine dipstick NEGATIVE NEGATIVE   Bilirubin Urine NEGATIVE NEGATIVE   Ketones, ur NEGATIVE NEGATIVE mg/dL   Protein, ur NEGATIVE NEGATIVE mg/dL   Nitrite NEGATIVE NEGATIVE   Leukocytes, UA NEGATIVE NEGATIVE  D-dimer, quantitative (not at [facility])  Result Value Ref Range   D-Dimer, Quant 8.78 (H) 0.00 - 0.50 ug/mL-FEU  I-Stat CG4 Lactic Acid, ED  Result Value Ref Range   Lactic Acid, Venous 3.10 (HH) 0.5 - 2.0 mmol/L  I-Stat CG4 Lactic Acid, ED  (not at  [facility])  Result Value Ref Range   Lactic Acid, Venous 2.51 (HH) 0.5 - 2.0 mmol/L   Dg Chest 2 View  08/18/2015  CLINICAL DATA:  Chest pain for 3 days.  Patient with PleurX catheter EXAM: CHEST  2 VIEW COMPARISON:  08/04/2015 FINDINGS: The cardiomediastinal silhouette is unremarkable. A right pleural catheter again identified. Small bilateral pleural effusions are noted with mild bibasilar atelectasis. There is no evidence of focal airspace disease, pulmonary edema, suspicious pulmonary nodule/mass, or pneumothorax. No acute bony abnormalities are identified. IMPRESSION: Small bilateral pleural effusions with mild bibasilar atelectasis. Electronically Signed   By: Margarette Canada M.D.   On: 08/18/2015 21:00   Dg Chest 2 View  08/02/2015  CLINICAL DATA:  50 year old male with shortness of breath and COPD EXAM: CHEST  2 VIEW COMPARISON:  Radiograph dated 07/31/2015 FINDINGS: Two views of the chest demonstrates emphysematous changes of the lungs. An area of increased density at the lung base on the lateral projection appears stable compared to prior study and may be artifactual or represent scarring. There is no focal consolidation, pleural effusion, or pneumothorax. The cardiac silhouette is within normal limits. No acute osseous  pathology identified. IMPRESSION: No acute cardiopulmonary process. Emphysema. Electronically Signed   By: Anner Crete M.D.   On: 08/02/2015 21:25   Dg Chest 2 View  07/31/2015  CLINICAL DATA:  Worsening shortness breath and cough for 1 day. Chest pain radiating to back. Right pleural effusion. Cirrhosis. EXAM: CHEST  2 VIEW COMPARISON:  07/21/2015 FINDINGS: Right-sided pleural catheter remains in place. Tiny residual right pleural effusion is noted posteriorly on the lateral projection. No pneumothorax visualized. No evidence of pulmonary infiltrate or edema. Heart size is normal. IMPRESSION: Tiny residual right posterior pleural effusion with right pleural catheter remaining in place. Electronically Signed   By: Earle Gell M.D.   On: 07/31/2015 11:46   Ct Head Wo Contrast  07/24/2015  CLINICAL DATA:  Right-sided numbness and tingling starting at 9 a.m. Photophobia. EXAM: CT HEAD WITHOUT CONTRAST TECHNIQUE: Contiguous axial images were obtained from the base of the skull through the vertex without intravenous contrast. COMPARISON:  10/25/2013 FINDINGS: Failure of fusion of the posterior arch of C1. The brainstem, cerebellum, cerebral peduncles, thalami, basal ganglia, basilar cisterns, and ventricular system appear within normal limits. No intracranial hemorrhage, mass lesion, or acute CVA. IMPRESSION: No significant abnormality identified. Electronically Signed   By: Van Clines M.D.   On: 07/24/2015 21:22   Ct Angio Chest Pe W/cm &/or Wo Cm  08/18/2015  CLINICAL DATA:  Hemoptysis EXAM: CT ANGIOGRAPHY CHEST WITH CONTRAST TECHNIQUE: Multidetector CT imaging of the chest was performed using the standard protocol during bolus administration of intravenous contrast. Multiplanar CT image reconstructions and MIPs were obtained to evaluate the vascular anatomy. CONTRAST:  165 cc Isovue 370 intravenous (given in divided doses due to suboptimal bolus timing on initial exam). COMPARISON:  08/07/2015 FINDINGS: THORACIC INLET/BODY WALL: No acute abnormality. MEDIASTINUM: Normal heart size.  No pericardial effusion.  Limited opacification of the pulmonary arterial tree requiring repeat exam. Second bolus is also suboptimal, but improved. There is no indication of pulmonary embolism, with adequate visualization at least to the segmental level. No acute aortic finding.  B Orderline lower esophageal thickening, improved from previous. No discrete periesophageal varix noted. LUNG WINDOWS: Tunneled pleural catheter on the right with stable small residual pleural fluid. TThe catheter is in unremarkable position. here is a new even smaller left pleural effusion. Mild dependent atelectasis. No consolidation, edema, or pneumothorax. No central airway lesion or obstruction. No visible alveolar hemorrhage. UPPER ABDOMEN: Cirrhosis with splenomegaly and ascites. No change in the epigastrium since prior. OSSEOUS: No acute fracture.  No suspicious lytic or blastic lesions. Review of the MIP images confirms the above findings. IMPRESSION: 1. No evidence of pulmonary embolism. No specific explanation for hemoptysis. 2. Small pleural effusions, stable on the right and new on the left since 08/07/2015. Right tunneled pleural catheter in good position. 3. Cirrhosis with ascites. Electronically Signed   By: Monte Fantasia M.D.   On: 08/18/2015 22:43   Ct Angio Chest Pe W/cm &/or Wo Cm  08/02/2015  CLINICAL DATA:  Shortness of breath, dyspnea with exertion, cough and fever. Home oxygen. Diagnosed with bronchitis on 05/16. Mid chest pressure. PleurX catheter placed a few weeks ago. Recurrent pleural effusion secondary to cirrhosis. EXAM: CT ANGIOGRAPHY CHEST WITH CONTRAST TECHNIQUE: Multidetector CT imaging of the chest was performed using the standard protocol during bolus administration of intravenous contrast. Multiplanar CT image reconstructions and MIPs were obtained to evaluate the vascular anatomy. CONTRAST:  100 mL Isovue 370 COMPARISON:  07/29/2015 FINDINGS: Technically adequate study with moderately  good opacification of the central and  proximal segmental pulmonary arteries. Contrast bolus limits evaluation of the more peripheral pulmonary arteries. Visualized pulmonary arteries demonstrate no focal filling defect suggesting no evidence of significant pulmonary embolus. Normal heart size. Normal caliber thoracic aorta. No evidence of aortic dissection. Great vessel origins are patent. Esophagus is decompressed. Distal esophageal wall appears thickened which may indicate reflux disease. Scattered lymph nodes in the mediastinum are not pathologically enlarged. Moderate size right pleural effusion with chest tube in place. There is fluid in the soft tissues of the right lateral chest muscles near the area of catheter insertion. This suggests possible catheter oblique each into the soft tissues. Atelectasis in the right lung base. Evaluation of lungs is limited due to respiratory motion artifact but there appears to be a mild interstitial pattern suggesting mild edema. Airways appear patent. Included portions of the upper abdominal organs demonstrate changes of hepatic cirrhosis with splenic enlargement and diffuse upper abdominal varices. Upper abdominal ascites is present. Degenerative changes in the spine. No destructive bone lesions. Review of the MIP images confirms the above findings. IMPRESSION: No evidence of significant pulmonary embolus to the level of the proximal segmental arteries. Right pleural effusion with chest tube in place. Fluid in the right lateral chest wall may indicate leakage from the chest tube. Atelectasis in the right lung base. Mild interstitial pattern to the lungs suggesting edema. 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 Abdominal ascites. Distal esophageal wall thickening without proximal dilatation may indicate reflux disease. Electronically Signed   By: Lucienne Capers M.D.   On: 08/02/2015 23:32   Mr Brain Wo  Contrast  07/25/2015  CLINICAL DATA:  Initial evaluation for acute numbness and tingling in right arm and right leg with headache, light sensitivity. EXAM: MRI HEAD WITHOUT CONTRAST TECHNIQUE: Multiplanar, multiecho pulse sequences of the brain and surrounding structures were obtained without intravenous contrast. COMPARISON:  Air CT from earlier same day. FINDINGS: Cerebral volume within normal limits for patient age. No focal parenchymal signal abnormality identified. Minimal asymmetric T2/FLAIR hyperintensity within the left caudate head as compared to the right, which may reflect asymmetrically prominent perivascular spaces or possibly capillary telangiectasia. This is felt to be of no significance. No abnormal foci of restricted diffusion to suggest acute infarct identified. Gray-white matter differentiation maintained. Major intracranial vascular flow voids are preserved. No acute or chronic intracranial hemorrhage. Single small focus of susceptibility artifact within the left globus pallidus likely related to mineralization. No mass lesion, midline shift, or mass effect. No hydrocephalus. No extra-axial fluid collection. Major dural sinuses are grossly patent. Craniocervical junction within normal limits. Upper cervical spine not well evaluated on this exam due to motion artifact. There is question of a tiny T1 hypo intense lesion within the posterior aspect of the pituitary gland. This measures approximately 4 mm. This is of uncertain clinical significance, but felt to not be related to patient's symptoms. Pituitary otherwise unremarkable. No acute abnormality about the orbits. Paranasal sinuses are clear. Right-sided concha bullosa with leftward septal nasal deviation noted. Trace opacity within the posterior right mastoid air cells. Inner ear structures grossly normal. Bone marrow signal intensity within normal limits. No scalp soft tissue abnormality. IMPRESSION: 1. No acute intracranial process  identified. 2. Question 4 mm T1 hypo intense lesion within the posterior aspect of the pituitary gland. This lesion is indeterminate, but felt to be an incidental finding, and not related to the patient's current symptoms.  Correlation with laboratory values suggested. Additionally, this could be further assessed with pituitary protocol MRI, which could be performed on a nonemergent outpatient basis. 3. Otherwise normal brain MRI. Electronically Signed   By: Jeannine Boga M.D.   On: 07/25/2015 00:11    11:10 PM patient resting comfortably. MDM  Substance called based on Sirs criteria of elevated lactate, fever. Source of infection likely healthcare associated pneumonia. Patient states he was hospitalized 5 weeks ago. Dr.Opyd consulted and will see patient in emergency department plan 23 hour observation at surgical floor. Cultures pending Final diagnoses:  None   Diagnosis hemoptysis     Orlie Dakin, MD 08/18/15 2325

## 2015-08-18 NOTE — H&P (Signed)
History and Physical    Roy Case XTG:[id number] DOB: [birth year] DOA: 08/18/2015  PCP: Mauricio Po, FNP   Patient coming from: Home   Chief Complaint: Hemoptysis  HPI: Roy Case is a 50 y.o. male with medical history significant for idiopathic hemachromatosis managed with phlebotomies, liver cirrhosis, chronic respiratory failure with supplemental oxygen requirement of 4-5 L/m, and pleural effusions with Pleurx catheter in the right chest presents to the ED with increased cough and hemoptysis since last night. Patient reports developing flank pain and dysuria approximately one week ago and was started on treatment with Cipro on 08/13/2015. He reports that these symptoms have resolved, but over the past day, he has had increased cough with production of dark red blood. Patient reports that he had recently suffered from sinus congestion, which is now resolving, but there has been no blood when he blows his nose. He denies any fevers or chills but endorses severe pain in his right lateral chest at the site of Pleurx catheter whenever he coughs. He reports that this pain has been present ever since the catheter was placed 5 weeks ago. Patient endorses bilateral lower extremity edema, denies unilateral swelling, redness, or tenderness. He denies recent long distance travel, and denies personal or family history of VTE. He denies abdominal pain, nausea, vomiting, or diarrhea. He does not take blood thinners or antiplatelet. He denies any known TB exposure, has not been recently homeless or incarcerated, and is not known to have HIV.  ED Course: Upon arrival to the ED, patient is found to be afebrile, saturating adequately on 5 L/m nasal cannula, tachycardic in the 110s, and with stable blood pressure. EKG features a sinus rhythm and chest x-ray demonstrates small bilateral pleural effusions. Chemistry panels notable for albumin of 2.4 and calcium of 7.7. CBC features a platelet count of 85,000  and MCV of 105.8, both indices appear stable relative to prior measurements. Urinalysis is grossly negative for infection and lactic acid is elevated to a value of 3.10. Blood and urine cultures were obtained and the patient was treated with empiric vancomycin and tazobactam for suspected pneumonia. D-dimer was obtained and returns elevated to a value of 8.78 and this was followed with a CTA PE study. There is no evidence of PE on the CTA and the right-sided Pleurx catheter appears to be in appropriate position. While still in the emergency department, patient's tachycardia improved and his lactate came down to 2.51. He will be brought into the hospital for observation in order to further evaluate and manage his hemoptysis and lactic acid elevation.  Review of Systems:  All other systems reviewed and apart from HPI, are negative.  Past Medical History  Diagnosis Date  . Hypertension   . Allergic rhinitis   . Asthma   . High cholesterol   . Cirrhosis ([place])   . Idiopathic hemochromatosis ([place]) 06/04/2015  . History of home oxygen therapy     4 liters all the time    Past Surgical History  Procedure Laterality Date  . Cholecystectomy  may 2014  . Skin cancer excision Left     arm  . Carpal tunnel release Right   . Cardiac catheterization N/A 05/31/2015    Procedure: Right Heart Cath;  Surgeon: Larey Dresser, MD;  Location: [place] [facility];  Service: Cardiovascular;  Laterality: N/A;  . Bone marrow biopsy  05-30-15     reports that he has never smoked. He has never used smokeless tobacco. He reports that  he does not drink alcohol or use illicit drugs.  Allergies  Allergen Reactions  . Penicillins Anaphylaxis and Other (See Comments)    Has patient had a PCN reaction causing immediate rash, facial/tongue/throat swelling, SOB or lightheadedness with hypotension: YES Has patient had a PCN reaction causing severe rash involving mucus membranes or skin necrosis:NO Has patient had a PCN  reaction occurring within the last 10 years: NO If all of the above answers are "NO", then may proceed with Cephalosporin use.  . Acetaminophen     Reduced liver function   . Azithromycin Other (See Comments)    Blisters on mouth  . Cefdinir     Blisters in mouth   . Doxycycline Rash    Family History  Problem Relation Age of Onset  . Lung cancer Mother   . Colon cancer Neg Hx   . Esophageal cancer Neg Hx   . Pancreatic cancer Neg Hx   . Kidney disease Neg Hx   . Liver disease Neg Hx      Prior to Admission medications   Medication Sig Start Date End Date Taking? Authorizing Provider  albuterol (PROVENTIL HFA;VENTOLIN HFA) 108 (90 Base) MCG/ACT inhaler Inhale 2 puffs into the lungs every 6 (six) hours as needed for wheezing or shortness of breath. 06/06/15  Yes Juanito Doom, MD  albuterol (PROVENTIL) (2.5 MG/3ML) 0.083% nebulizer solution Take 3 mLs (2.5 mg total) by nebulization every 4 (four) hours as needed for wheezing or shortness of breath. 05/03/15  Yes Golden Circle, FNP  ALPRAZolam Duanne Moron) 0.5 MG tablet Take 0.5 mg by mouth 2 (two) times daily as needed for anxiety.    Yes Historical Provider, MD  ciprofloxacin (CIPRO) 500 MG tablet Take 500 mg by mouth 2 (two) times daily. 08/13/15  Yes Historical Provider, MD  fluticasone furoate-vilanterol (BREO ELLIPTA) 100-25 MCG/INH AEPB Inhale 1 puff into the lungs daily. Reported on 05/16/2015 05/16/15  Yes Marijean Heath, NP  furosemide (LASIX) 40 MG tablet Take 1 tablet (40 mg total) by mouth daily. Patient taking differently: Take 40 mg by mouth 2 (two) times daily.  06/27/15  Yes Volanda Napoleon, MD  ibuprofen (ADVIL,MOTRIN) 800 MG tablet Take 800 mg by mouth every 8 (eight) hours as needed for moderate pain.    Yes Historical Provider, MD  pantoprazole (PROTONIX) 40 MG tablet Take 40 mg by mouth daily.   Yes Historical Provider, MD  potassium chloride SA (K-DUR,KLOR-CON) 20 MEQ tablet Take 20 mEq by mouth daily.   Yes  Historical Provider, MD  spironolactone (ALDACTONE) 25 MG tablet Take 25 mg by mouth 2 (two) times daily.   Yes Historical Provider, MD  acetaminophen-codeine (TYLENOL #3) 300-30 MG tablet Take 1 tablet by mouth every 6 (six) hours as needed for moderate pain. Patient not taking: Reported on 08/18/2015 07/27/15   Golden Circle, FNP  levofloxacin (LEVAQUIN) 500 MG tablet Take 1 tablet (500 mg total) by mouth daily. Patient not taking: Reported on 08/18/2015 07/31/15   Pattricia Boss, MD  OXYGEN Inhale 4.5-5 L into the lungs continuous.     Historical Provider, MD  propranolol (INDERAL) 40 MG tablet Take 1 tablet (40 mg total) by mouth 2 (two) times daily. Patient not taking: Reported on 08/18/2015 06/05/15   Larey Dresser, MD    Physical Exam: Filed Vitals:   08/18/15 2004 08/18/15 2030 08/18/15 2130 08/18/15 2331  BP: 114/68 131/85 126/80 119/81  Pulse:  100 98 103  Temp:  TempSrc:      Resp:  19 25   SpO2:  94% 94% 94%      Constitutional: NAD, calm, comfortable, cachectic Eyes: PERTLA, lids and conjunctivae normal ENMT: Mucous membranes are moist. Posterior pharynx clear of any exudate or lesions.   Neck: normal, supple, no masses, no thyromegaly Respiratory: clear to auscultation bilaterally, no wheezing, no crackles. Normal respiratory effort.    Cardiovascular: S1 & S2 heard, regular rate and rhythm, no significant murmurs / rubs / gallops. 2+ pretibial pitting edema b/l. No significant JVD. Abdomen: Mild distension, no tenderness, no masses palpated. Bowel sounds normal.  Musculoskeletal: no clubbing / cyanosis. No joint deformity upper and lower extremities.    Skin: no significant rashes, lesions, ulcers. Warm, dry, well-perfused. Neurologic: CN 2-12 grossly intact. Sensation intact, DTR normal. Strength 5/5 in all 4 limbs.  Psychiatric: Normal judgment and insight. Alert and oriented x 3. Normal mood and affect.     Labs on Admission: I have personally reviewed following  labs and imaging studies  CBC:  Recent Labs Lab 08/18/15 1946  WBC 4.0  NEUTROABS 2.3  HGB 13.9  HCT 39.9  MCV 105.8*  PLT 85*   Basic Metabolic Panel:  Recent Labs Lab 08/18/15 1946  NA 138  K 3.6  CL 108  CO2 24  GLUCOSE 116*  BUN 8  CREATININE 0.72  CALCIUM 7.7*   GFR: Estimated Creatinine Clearance: 112.9 mL/min (by C-G formula based on Cr of 0.72). Liver Function Tests:  Recent Labs Lab 08/18/15 1946  AST 88*  ALT 40  ALKPHOS 64  BILITOT 1.3*  PROT 6.5  ALBUMIN 2.4*   No results for input(s): LIPASE, AMYLASE in the last 168 hours. No results for input(s): AMMONIA in the last 168 hours. Coagulation Profile: No results for input(s): INR, PROTIME in the last 168 hours. Cardiac Enzymes: No results for input(s): CKTOTAL, CKMB, CKMBINDEX, TROPONINI in the last 168 hours. BNP (last 3 results) No results for input(s): PROBNP in the last 8760 hours. HbA1C: No results for input(s): HGBA1C in the last 72 hours. CBG: No results for input(s): GLUCAP in the last 168 hours. Lipid Profile: No results for input(s): CHOL, HDL, LDLCALC, TRIG, CHOLHDL, LDLDIRECT in the last 72 hours. Thyroid Function Tests: No results for input(s): TSH, T4TOTAL, FREET4, T3FREE, THYROIDAB in the last 72 hours. Anemia Panel: No results for input(s): VITAMINB12, FOLATE, FERRITIN, TIBC, IRON, RETICCTPCT in the last 72 hours. Urine analysis:    Component Value Date/Time   COLORURINE AMBER* 08/18/2015 2049   APPEARANCEUR CLOUDY* 08/18/2015 2049   LABSPEC 1.030 08/18/2015 2049   PHURINE 5.5 08/18/2015 2049   GLUCOSEU NEGATIVE 08/18/2015 2049   HGBUR NEGATIVE 08/18/2015 2049   BILIRUBINUR NEGATIVE 08/18/2015 2049   [place] NEGATIVE 08/18/2015 2049   PROTEINUR NEGATIVE 08/18/2015 2049   NITRITE NEGATIVE 08/18/2015 2049   LEUKOCYTESUR NEGATIVE 08/18/2015 2049   Sepsis Labs: '@LABRCNTIP' (procalcitonin:4,lacticidven:4) )No results found for this or any previous visit (from the past  240 hour(s)).   Radiological Exams on Admission: Dg Chest 2 View  08/18/2015  CLINICAL DATA:  Chest pain for 3 days.  Patient with PleurX catheter EXAM: CHEST  2 VIEW COMPARISON:  08/04/2015 FINDINGS: The cardiomediastinal silhouette is unremarkable. A right pleural catheter again identified. Small bilateral pleural effusions are noted with mild bibasilar atelectasis. There is no evidence of focal airspace disease, pulmonary edema, suspicious pulmonary nodule/mass, or pneumothorax. No acute bony abnormalities are identified. IMPRESSION: Small bilateral pleural effusions with mild bibasilar atelectasis. Electronically  Signed   By: Margarette Canada M.D.   On: 08/18/2015 21:00   Ct Angio Chest Pe W/cm &/or Wo Cm  08/18/2015  CLINICAL DATA:  Hemoptysis EXAM: CT ANGIOGRAPHY CHEST WITH CONTRAST TECHNIQUE: Multidetector CT imaging of the chest was performed using the standard protocol during bolus administration of intravenous contrast. Multiplanar CT image reconstructions and MIPs were obtained to evaluate the vascular anatomy. CONTRAST:  165 cc Isovue 370 intravenous (given in divided doses due to suboptimal bolus timing on initial exam). COMPARISON:  08/07/2015 FINDINGS: THORACIC INLET/BODY WALL: No acute abnormality. MEDIASTINUM: Normal heart size.  No pericardial effusion. Limited opacification of the pulmonary arterial tree requiring repeat exam. Second bolus is also suboptimal, but improved. There is no indication of pulmonary embolism, with adequate visualization at least to the segmental level. No acute aortic finding.  B Orderline lower esophageal thickening, improved from previous. No discrete periesophageal varix noted. LUNG WINDOWS: Tunneled pleural catheter on the right with stable small residual pleural fluid. TThe catheter is in unremarkable position. here is a new even smaller left pleural effusion. Mild dependent atelectasis. No consolidation, edema, or pneumothorax. No central airway lesion or  obstruction. No visible alveolar hemorrhage. UPPER ABDOMEN: Cirrhosis with splenomegaly and ascites. No change in the epigastrium since prior. OSSEOUS: No acute fracture.  No suspicious lytic or blastic lesions. Review of the MIP images confirms the above findings. IMPRESSION: 1. No evidence of pulmonary embolism. No specific explanation for hemoptysis. 2. Small pleural effusions, stable on the right and new on the left since 08/07/2015. Right tunneled pleural catheter in good position. 3. Cirrhosis with ascites. Electronically Signed   By: Monte Fantasia M.D.   On: 08/18/2015 22:43    EKG: Independently reviewed. Sinus rhythm   Assessment/Plan  1. Hemoptysis  - Etiology uncertain at this time  - CTA is negative for PE; no RF's for TB identified  - Possibly related to the Pleurx catheter in right chest in the setting of thrombocytopenia  - H&H are wnl on admission; will repeat in am though does not seem to be a large-vol hemoptysis  - There was concern for possible PNA in the ED and he received empiric doses of vanc and Azactam, but CTA does not support this    2. Lactic acid elevation   - Lactate 3.10 on presentation, then down to 2.51, trending  - There was initial concern for sepsis and empiric vanc and Azactam administered in ED for PNA  - No evidence of PNA on CTA; no fever or leukocytosis - Blood and urine cultures incubating  - Check PCT  - Plan to complete his final 2 doses of Cipro for UTI, but otherwise watch off abx for now while following cultures    3. Chronic respiratory failure with hypoxia - Stable on admission; saturating well on his usual 4-5 Lpm  - Etiology is unknown at this time  - He is followed by pulmonology in outpatient setting and a L->R shunt (intracardiac or pulmonary) suspected  - Per pulm notes, there is plan for TEE to look for intracardiac shunt - Continue Breo Ellipta, prn albuterol MDI  - Titrate FiO2 to maintain sats >92%   4. Idiopathic  hemochromatosis    - Followed by hematology in outpatient setting and treated with phlebotomies  - Continue management per hematology   5. Liver cirrhosis  - Unclear if this is secondary to hemachromatosis; per pt, has been attributed to hepatic steatosis - Followed by [facility] in outpatient setting  -  Seems to be developing ascites now  - No EGD on file; no esophageal varices detected on CTA obtained in ED  - Managed with Lasix and Aldactone; holding for now in setting of elevated lactate    6. Pleural effusions - Likely secondary to liver cirrhosis  - Has Pleurx catheter in right chest, placed 5 wks ago per pt; appears to be in appropriate position on imaging   - Right-sided effusion small and stable relative to recent CT; left-sided effusion is small and new  - Saturating well on his usual FiO2   7. UTI  - Pt reports being started on Cipro 08/13/15 for fever and flank pain  - Fevers and pain has resolved - UA taken in ED with no blood, leukocytes, or nitrites; culture incubating  - Continue Cipro to complete course, 2 more doses ordered    8. Thrombocytopenia  - Platelet count 85,000 on admission  - Stable relative to prior measurements  - Likely secondary to liver cirrhosis and hemachromatosis  - Hold pharmacologic VTE ppx    DVT prophylaxis: SCD's  Code Status: Full  Family Communication: Discussed with patient  Disposition Plan: Observe on med-surg  Consults called: None  Admission status: Observation    Vianne Bulls, MD [facility] Pager [phone]  If 7PM-7AM, please contact night-coverage [email or web address] Password [facility]  08/18/2015, 11:57 PM

## 2015-08-18 NOTE — Progress Notes (Signed)
Pharmacy Antibiotic Note  Roy Case is a 50 y.o. male admitted on 08/18/2015 with pneumonia.  Pharmacy has been consulted for azactam/vancomyhcin dosing.  Plan: Vancomycin 1Gm IV q8h (VT=15-20 mg/L) Azactam 2Gm IV q8h     Temp (24hrs), Avg:99.2 F (37.3 C), Min:99.2 F (37.3 C), Max:99.2 F (37.3 C)   Recent Labs Lab 08/18/15 1946 08/18/15 1954 08/18/15 2113  WBC 4.0  --   --   CREATININE 0.72  --   --   LATICACIDVEN  --  3.10* 2.51*    Estimated Creatinine Clearance: 112.9 mL/min (by C-G formula based on Cr of 0.72).    Allergies  Allergen Reactions  . Penicillins Anaphylaxis and Other (See Comments)    Has patient had a PCN reaction causing immediate rash, facial/tongue/throat swelling, SOB or lightheadedness with hypotension: YES Has patient had a PCN reaction causing severe rash involving mucus membranes or skin necrosis:NO Has patient had a PCN reaction occurring within the last 10 years: NO If all of the above answers are "NO", then may proceed with Cephalosporin use.  . Acetaminophen     Reduced liver function   . Azithromycin Other (See Comments)    Blisters on mouth  . Cefdinir     Blisters in mouth   . Doxycycline Rash    Antimicrobials this admission: 6/3 azactam >>  6/3 vancomycin >>   Dose adjustments this admission:   Microbiology results:  BCx:   UCx:    Sputum:   MRSA PCR:   Thank you for allowing pharmacy to be a part of this patient's care.  Lorenza EvangelistGreen, Willena Jeancharles R 08/18/2015 9:27 PM

## 2015-08-19 ENCOUNTER — Observation Stay (HOSPITAL_COMMUNITY): Payer: BLUE CROSS/BLUE SHIELD

## 2015-08-19 DIAGNOSIS — J9611 Chronic respiratory failure with hypoxia: Secondary | ICD-10-CM | POA: Diagnosis not present

## 2015-08-19 DIAGNOSIS — K7460 Unspecified cirrhosis of liver: Secondary | ICD-10-CM | POA: Diagnosis not present

## 2015-08-19 DIAGNOSIS — J948 Other specified pleural conditions: Secondary | ICD-10-CM

## 2015-08-19 DIAGNOSIS — R042 Hemoptysis: Secondary | ICD-10-CM | POA: Diagnosis not present

## 2015-08-19 DIAGNOSIS — K769 Liver disease, unspecified: Secondary | ICD-10-CM

## 2015-08-19 DIAGNOSIS — E872 Acidosis, unspecified: Secondary | ICD-10-CM | POA: Insufficient documentation

## 2015-08-19 DIAGNOSIS — R1084 Generalized abdominal pain: Secondary | ICD-10-CM | POA: Diagnosis not present

## 2015-08-19 LAB — COMPREHENSIVE METABOLIC PANEL
ALBUMIN: 2.2 g/dL — AB (ref 3.5–5.0)
ALT: 38 U/L (ref 17–63)
AST: 75 U/L — AB (ref 15–41)
Alkaline Phosphatase: 55 U/L (ref 38–126)
Anion gap: 6 (ref 5–15)
BUN: 9 mg/dL (ref 6–20)
CO2: 22 mmol/L (ref 22–32)
CREATININE: 0.66 mg/dL (ref 0.61–1.24)
Calcium: 7.9 mg/dL — ABNORMAL LOW (ref 8.9–10.3)
Chloride: 110 mmol/L (ref 101–111)
GFR calc Af Amer: >60 mL/min (ref >60)
GFR calc non Af Amer: >60 mL/min (ref >60)
Glucose, Bld: 193 mg/dL — ABNORMAL HIGH (ref 65–99)
Potassium: 3.9 mmol/L (ref 3.5–5.1)
SODIUM: 138 mmol/L (ref 135–145)
Total Bilirubin: 1.4 mg/dL — ABNORMAL HIGH (ref 0.3–1.2)
Total Protein: 6.1 g/dL — ABNORMAL LOW (ref 6.5–8.1)

## 2015-08-19 LAB — LACTIC ACID, PLASMA
Lactic Acid, Venous: 2.9 mmol/L (ref 0.5–2.0)
Lactic Acid, Venous: 2.6 mmol/L (ref 0.5–2.0)

## 2015-08-19 LAB — PROTIME-INR
INR: 1.8 — ABNORMAL HIGH (ref 0.00–1.49)
PROTHROMBIN TIME: 20.2 s — AB (ref 11.6–15.2)

## 2015-08-19 LAB — SEDIMENTATION RATE: Sed Rate: 51 mm/hr — ABNORMAL HIGH (ref 0–16)

## 2015-08-19 LAB — PROCALCITONIN: Procalcitonin: 0.13 ng/mL

## 2015-08-19 LAB — HEMATOCRIT: HEMATOCRIT: 36.4 % — AB (ref 39.0–52.0)

## 2015-08-19 LAB — GLUCOSE, CAPILLARY: GLUCOSE-CAPILLARY: 166 mg/dL — AB (ref 65–99)

## 2015-08-19 LAB — APTT: aPTT: 37 seconds (ref 24–37)

## 2015-08-19 LAB — HEMOGLOBIN: Hemoglobin: 12.9 g/dL — ABNORMAL LOW (ref 13.0–17.0)

## 2015-08-19 MED ORDER — SODIUM CHLORIDE 0.9 % IV BOLUS (SEPSIS)
500.0000 mL | Freq: Once | INTRAVENOUS | Status: AC
  Administered 2015-08-19: 500 mL via INTRAVENOUS

## 2015-08-19 MED ORDER — SODIUM CHLORIDE 0.9 % IV SOLN
1.0000 g | Freq: Once | INTRAVENOUS | Status: AC
  Administered 2015-08-19: 1 g via INTRAVENOUS
  Filled 2015-08-19: qty 10

## 2015-08-19 NOTE — Progress Notes (Signed)
CRITICAL VALUE ALERT  Critical value received:  Lactic Acid 2.6  Date of notification:  08/19/15  Time of notification:  0435  Critical value read back:Yes.    Nurse who received alert:  Jill SideAmanda L, RN  MD notified (1st page):  Benedetto Coons. Callahan, NP  Time of first page:  [phone]  MD notified (2nd page):  Time of second page:  Responding MD:  Benedetto Coons Callahan  Time MD responded:  [phone]

## 2015-08-19 NOTE — Progress Notes (Signed)
CRITICAL VALUE ALERT  Critical value received:  Lactuc acid 2.9  Date of notification:  08/19/15  Time of notification:  0149  Critical value read back:Yes.    Nurse who received alert:  Jill SideAmanda L, RN  MD notified (1st page):  Benedetto Coons. Callahan, NP  Time of first page:  0150  MD notified (2nd page):  Time of second page:  Responding MD:  Benedetto Coons. Callahan, NP  Time MD responded:  [phone]

## 2015-08-19 NOTE — Progress Notes (Signed)
PROGRESS NOTE    Roy Case  ZOX:[id number] DOB: 1965/10/11 DOA: 08/18/2015 PCP: Jeanine Luz, FNP  Outpatient Specialists: Arlyce Harman Brief Narrative: Roy Case is a 50 y.o. male with medical history significant for idiopathic hemachromatosis managed with phlebotomies, liver cirrhosis, chronic respiratory failure with supplemental oxygen requirement of 4-5 L/m, and pleural effusions with Pleurx catheter in the right chest presents to the ED with increased cough and hemoptysis since last night. Patient reports developing flank pain and dysuria approximately one week ago and was started on treatment with Cipro on 08/13/2015. He reports that these symptoms have resolved, but over the past day, he has had increased cough with production of dark red blood. Patient reports that he had recently suffered from sinus congestion, which is now resolving, but there has been no blood when he blows his nose. He denies any fevers or chills but endorses severe pain in his right lateral chest at the site of Pleurx catheter whenever he coughs. He reports that this pain has been present ever since the catheter was placed 5 weeks ago. Patient endorses bilateral lower extremity edema, denies unilateral swelling, redness, or tenderness. He denies recent long distance travel, and denies personal or family history of VTE. He denies abdominal pain, nausea, vomiting, or diarrhea. He does not take blood thinners or antiplatelet. He denies any known TB exposure, has not been recently homeless or incarcerated, and is not known to have HIV  Assessment & Plan:  1. Hemoptysis  - suspect related to cough, recent bronchitis, in background of coagulopathy from Cirrhosis and thrombocytopenia - CTA is negative for PE; no RF's for TB identified  - improved  2. Lactic acid elevation  - Lactate 3.10 on presentation, then down to 2.51, trending down - do not suspect sepsis clinically  3. 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 Increased Abd distension -suspect increasing ascites clinically and noted on CTA -will get diagnostic and therapeutic paracentesis -having BMs -lasix and aldactone on hold due to elevated lactate, resume tomorrow  5. Idiopathic hemochromatosis  - Followed by hematology in outpatient setting and treated with phlebotomies  - Continue management per hematology   6. Liver cirrhosis  - suspect secondary to hemachromatosis; +/- NASH - Followed by [facility] in outpatient setting  - Seems to be developing ascites now  - No EGD on file; no esophageal varices detected on CTA obtained in ED  - Managed with Lasix and Aldactone -referred for Transplant eval, appt in [place] on thursday  7. Pleural effusions - Likely secondary to liver cirrhosis , ? Hepatic hydrothorax - Has Pleurx catheter in right chest, placed 5 wks ago per pt; appears to be in appropriate position on imaging  - Right-sided effusion small and stable relative to recent CT; left-sided effusion is small  - Saturating well on his usual FiO2   8. UTI  - Pt reports being started on Cipro 08/13/15 for fever and flank pain  - Fevers and pain has resolved - UA  taken in ED with no blood, leukocytes, or nitrites; culture incubating  - stop CIpro, completes 7days today  9. Thrombocytopenia  - Platelet count 85,000 on admission  - Stable, secondary to liver cirrhosis and hemachromatosis  - Hold pharmacologic VTE ppx    DVT prophylaxis: SCD's  Code Status: Full  Family Communication: Discussed with patient   Antimicrobials: Cipro5/29-6/4 Subjective: Having increasing abd distension and discomfort despite BM this am  Objective: Filed Vitals:   08/18/15 2331 08/19/15 0000 08/19/15 0043 08/19/15 0547  BP: 119/81 133/89 126/69 111/68  Pulse: 103 98 97 89  Temp:   98.5 F (36.9 C) 97.8 F (36.6 C)  TempSrc:   Oral Oral  Resp:   22 18  Height:   5\' 9"  (1.753 m)   Weight:   78.6 kg (173 lb 4.5 oz)   SpO2: 94% 95% 92% 97%    Intake/Output Summary (Last 24 hours) at 08/19/15 0929 Last data filed at 08/19/15 0600  Gross per 24 hour  Intake 1383.33 ml  Output    300 ml  Net 1083.33 ml   Filed Weights   08/19/15 0043  Weight: 78.6 kg (173 lb 4.5 oz)    Examination:  General exam: Appears calm and comfortable, no distress, chronically ill and appears much older than stated age Respiratory system: Clear to auscultation. Respiratory effort normal. Cardiovascular system: S1 & S2 heard, RRR. No JVD, murmurs, rubs, gallops or clicks. No pedal edema. Gastrointestinal system: Abdomen is distended, tense with fluid thrill, soft and nontender. Normal bowel sounds heard. Central nervous system: Alert and oriented. No focal neurological deficits. Extremities: Symmetric 5 x 5 power. Skin: No rashes, lesions or ulcers Psychiatry: Judgement and insight appear normal. Mood & affect appropriate.     Data Reviewed: I have personally reviewed following labs and imaging studies  CBC:  Recent Labs Lab 08/18/15 1946 08/19/15 0356  WBC 4.0  --   NEUTROABS 2.3  --   HGB 13.9 12.9*  HCT 39.9 36.4*  MCV 105.8*  --   PLT 85*  --    Basic Metabolic Panel:  Recent Labs Lab 08/18/15 1946 08/19/15 0356  NA 138 138  K 3.6 3.9  CL 108 110  CO2 24 22  GLUCOSE 116* 193*  BUN 8 9  CREATININE 0.72 0.66  CALCIUM 7.7* 7.9*   GFR: Estimated Creatinine Clearance: 112.9 mL/min (by C-G formula based on  Cr of 0.66). Liver Function Tests:  Recent Labs Lab 08/18/15 1946 08/19/15 0356  AST 88* 75*  ALT 40 38  ALKPHOS 64 55  BILITOT 1.3* 1.4*  PROT 6.5 6.1*  ALBUMIN 2.4* 2.2*   No results for input(s): LIPASE, AMYLASE in the last 168 hours. No results for input(s): AMMONIA in the last 168 hours. Coagulation Profile:  Recent Labs Lab 08/19/15 0105  INR 1.80*   Cardiac Enzymes: No results for input(s): CKTOTAL, CKMB, CKMBINDEX, TROPONINI in the last 168 hours. BNP (last 3 results) No results for input(s): PROBNP in the last 8760 hours. HbA1C: No results for input(s): HGBA1C in the last 72 hours. CBG:  Recent Labs Lab 08/19/15 0746  GLUCAP 166*   Lipid Profile: No results for input(s): CHOL, HDL, LDLCALC, TRIG, CHOLHDL, LDLDIRECT in the last 72 hours. Thyroid Function Tests: No results for input(s): TSH, T4TOTAL, FREET4, T3FREE, THYROIDAB in the last 72 hours. Anemia Panel: No results for input(s): VITAMINB12, FOLATE, FERRITIN, TIBC, IRON, RETICCTPCT in the last 72 hours. Urine analysis:    Component Value Date/Time  COLORURINE AMBER* 08/18/2015 2049   APPEARANCEUR CLOUDY* 08/18/2015 2049   LABSPEC 1.030 08/18/2015 2049   PHURINE 5.5 08/18/2015 2049   GLUCOSEU NEGATIVE 08/18/2015 2049   HGBUR NEGATIVE 08/18/2015 2049   BILIRUBINUR NEGATIVE 08/18/2015 2049   KETONESUR NEGATIVE 08/18/2015 2049   PROTEINUR NEGATIVE 08/18/2015 2049   NITRITE NEGATIVE 08/18/2015 2049   LEUKOCYTESUR NEGATIVE 08/18/2015 2049   Sepsis Labs: @LABRCNTIP (procalcitonin:4,lacticidven:4)  )No results found for this or any previous visit (from the past 240 hour(s)).       Radiology Studies: Dg Chest 2 View  08/18/2015  CLINICAL DATA:  Chest pain for 3 days.  Patient with PleurX catheter EXAM: CHEST  2 VIEW COMPARISON:  08/04/2015 FINDINGS: The cardiomediastinal silhouette is unremarkable. A right pleural catheter again identified. Small bilateral pleural effusions are noted with mild  bibasilar atelectasis. There is no evidence of focal airspace disease, pulmonary edema, suspicious pulmonary nodule/mass, or pneumothorax. No acute bony abnormalities are identified. IMPRESSION: Small bilateral pleural effusions with mild bibasilar atelectasis. Electronically Signed   By: Harmon PierJeffrey  Hu M.D.   On: 08/18/2015 21:00   Ct Angio Chest Pe W/cm &/or Wo Cm  08/18/2015  CLINICAL DATA:  Hemoptysis EXAM: CT ANGIOGRAPHY CHEST WITH CONTRAST TECHNIQUE: Multidetector CT imaging of the chest was performed using the standard protocol during bolus administration of intravenous contrast. Multiplanar CT image reconstructions and MIPs were obtained to evaluate the vascular anatomy. CONTRAST:  165 cc Isovue 370 intravenous (given in divided doses due to suboptimal bolus timing on initial exam). COMPARISON:  08/07/2015 FINDINGS: THORACIC INLET/BODY WALL: No acute abnormality. MEDIASTINUM: Normal heart size.  No pericardial effusion. Limited opacification of the pulmonary arterial tree requiring repeat exam. Second bolus is also suboptimal, but improved. There is no indication of pulmonary embolism, with adequate visualization at least to the segmental level. No acute aortic finding.  B Orderline lower esophageal thickening, improved from previous. No discrete periesophageal varix noted. LUNG WINDOWS: Tunneled pleural catheter on the right with stable small residual pleural fluid. TThe catheter is in unremarkable position. here is a new even smaller left pleural effusion. Mild dependent atelectasis. No consolidation, edema, or pneumothorax. No central airway lesion or obstruction. No visible alveolar hemorrhage. UPPER ABDOMEN: Cirrhosis with splenomegaly and ascites. No change in the epigastrium since prior. OSSEOUS: No acute fracture.  No suspicious lytic or blastic lesions. Review of the MIP images confirms the above findings. IMPRESSION: 1. No evidence of pulmonary embolism. No specific explanation for hemoptysis. 2.  Small pleural effusions, stable on the right and new on the left since 08/07/2015. Right tunneled pleural catheter in good position. 3. Cirrhosis with ascites. Electronically Signed   By: Marnee SpringJonathon  Watts M.D.   On: 08/18/2015 22:43        Scheduled Meds: . fluticasone furoate-vilanterol  1 puff Inhalation Daily  . pantoprazole  40 mg Oral Daily   Continuous Infusions:       Time spent: 35min    Zannie CovePreetha Danuta Huseman, MD [facility] Pager [phone]  If 7PM-7AM, please contact night-coverage [email or web address] Password [email or web address] 08/19/2015, 9:29 AM

## 2015-08-20 ENCOUNTER — Observation Stay (HOSPITAL_COMMUNITY): Payer: BLUE CROSS/BLUE SHIELD

## 2015-08-20 DIAGNOSIS — R042 Hemoptysis: Secondary | ICD-10-CM | POA: Diagnosis not present

## 2015-08-20 DIAGNOSIS — K7460 Unspecified cirrhosis of liver: Secondary | ICD-10-CM

## 2015-08-20 DIAGNOSIS — J9611 Chronic respiratory failure with hypoxia: Secondary | ICD-10-CM | POA: Diagnosis not present

## 2015-08-20 LAB — BODY FLUID CELL COUNT WITH DIFFERENTIAL
Lymphs, Fluid: 7 %
Monocyte-Macrophage-Serous Fluid: 93 % — ABNORMAL HIGH (ref 50–90)
Total Nucleated Cell Count, Fluid: 268 cu mm (ref 0–1000)

## 2015-08-20 LAB — COMPREHENSIVE METABOLIC PANEL
ALBUMIN: 1.9 g/dL — AB (ref 3.5–5.0)
ALT: 38 U/L (ref 17–63)
ANION GAP: 3 — AB (ref 5–15)
AST: 69 U/L — ABNORMAL HIGH (ref 15–41)
Alkaline Phosphatase: 51 U/L (ref 38–126)
BUN: 10 mg/dL (ref 6–20)
CHLORIDE: 111 mmol/L (ref 101–111)
CO2: 24 mmol/L (ref 22–32)
Calcium: 7.8 mg/dL — ABNORMAL LOW (ref 8.9–10.3)
Creatinine, Ser: 0.56 mg/dL — ABNORMAL LOW (ref 0.61–1.24)
GFR calc non Af Amer: >60 mL/min (ref >60)
GLUCOSE: 115 mg/dL — AB (ref 65–99)
POTASSIUM: 4.3 mmol/L (ref 3.5–5.1)
SODIUM: 138 mmol/L (ref 135–145)
Total Bilirubin: 0.8 mg/dL (ref 0.3–1.2)
Total Protein: 5.1 g/dL — ABNORMAL LOW (ref 6.5–8.1)

## 2015-08-20 LAB — CBC
HCT: 32.5 % — ABNORMAL LOW (ref 39.0–52.0)
Hemoglobin: 11.6 g/dL — ABNORMAL LOW (ref 13.0–17.0)
MCH: 37.3 pg — AB (ref 26.0–34.0)
MCHC: 35.7 g/dL (ref 30.0–36.0)
MCV: 104.5 fL — ABNORMAL HIGH (ref 78.0–100.0)
PLATELETS: 58 10*3/uL — AB (ref 150–400)
RBC: 3.11 MIL/uL — ABNORMAL LOW (ref 4.22–5.81)
RDW: 13.5 % (ref 11.5–15.5)
WBC: 5.4 10*3/uL (ref 4.0–10.5)

## 2015-08-20 LAB — URINE CULTURE: CULTURE: NO GROWTH

## 2015-08-20 LAB — GLUCOSE, CAPILLARY: Glucose-Capillary: 118 mg/dL — ABNORMAL HIGH (ref 65–99)

## 2015-08-20 LAB — ALBUMIN, FLUID (OTHER)

## 2015-08-20 LAB — PROCALCITONIN

## 2015-08-20 NOTE — Procedures (Signed)
Ultrasound-guided diagnostic and therapeutic paracentesis performed yielding 900 cc of hazy,light yellow fluid. No immediate complications. The fluid was submitted to the laboratory for preordered studies.

## 2015-08-20 NOTE — Progress Notes (Addendum)
PROGRESS NOTE    Roy Case  MWU:[id number] DOB: 1965/08/23 DOA: 08/18/2015 PCP: Jeanine Luz, FNP  Outpatient Specialists: Arlyce Harman Brief Narrative: Roy Case is a 50 y.o. male with medical history significant for idiopathic hemachromatosis managed with phlebotomies, liver cirrhosis, chronic respiratory failure with supplemental oxygen requirement of 4-5 L/m, and pleural effusions with Pleurx catheter in the right chest presents to the ED with increased cough and hemoptysis since last night. Patient reports developing flank pain and dysuria approximately one week ago and was started on treatment with Cipro on 08/13/2015. He reports that these symptoms have resolved, but over the past day, he has had increased cough with production of dark red blood. Patient reports that he had recently suffered from sinus congestion, which is now resolving, but there has been no blood when he blows his nose. He denies any fevers or chills but endorses severe pain in his right lateral chest at the site of Pleurx catheter whenever he coughs. He reports that this pain has been present ever since the catheter was placed 5 weeks ago. Patient endorses bilateral lower extremity edema, denies unilateral swelling, redness, or tenderness. He denies recent long distance travel, and denies personal or family history of VTE. He denies abdominal pain, nausea, vomiting, or diarrhea. He does not take blood thinners or antiplatelet. He denies any known TB exposure, has not been recently homeless or incarcerated, and is not known to have HIV. 6/4: Paracentesis ordered-pending  Assessment & Plan:  1. Hemoptysis  - suspect related to cough, recent bronchitis, in background of coagulopathy from Cirrhosis and thrombocytopenia - CTA is negative for PE; Pneumonia, no risk factors for TB identified  - resolved  2. Lactic acid elevation  - Lactate 3.10 on presentation, then down to 2.51, trending down - do  not suspect sepsis clinically  3. 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 Increased Abd distension -suspect increasing ascites clinically and noted on CTA -will get diagnostic and therapeutic paracentesis -having BMs -lasix and aldactone on hold due to elevated lactate, resume tomorrow  5. Idiopathic hemochromatosis  - Followed by hematology in outpatient setting and treated with phlebotomies  - Continue management per hematology   6. Liver cirrhosis  - suspect secondary to hemachromatosis; +/- NASH - Followed by [facility] in outpatient setting  - Seems to be developing ascites now  - No EGD on file; no esophageal varices detected on CTA obtained in ED  - Managed with Lasix and Aldactone -referred for Transplant eval, appt in [place] on thursday  7. Pleural effusions - Likely secondary to liver cirrhosis , ? Hepatic hydrothorax - Has Pleurx catheter in right chest, placed 5 wks ago per pt; appears to be in appropriate position on imaging  - Right-sided effusion small and stable relative to recent CT; left-sided effusion is small  - Saturating well on his usual FiO2 /5L  8. UTI  - Pt reports being started on Cipro 08/13/15  for fever and flank pain  - Fevers and pain has resolved - U cx from 6/3: with NGTD  - stopped CIpro, completed 7days on 6/4  9. Thrombocytopenia  - Platelet count 50-80K  - Stable, secondary to liver cirrhosis and  hemachromatosis  - Hold pharmacologic VTE ppx   DVT prophylaxis: SCD's  Code Status: Full  Family Communication: Discussed with patient   Antimicrobials: Cipro5/29-6/4 Subjective: Having increasing abd distension, breathing somewhat stable  Objective: Filed Vitals:   08/19/15 1508 08/19/15 2134 08/20/15 0450 08/20/15 0914  BP: 122/67 122/70 108/70   Pulse: 95 95 80   Temp: 97.6 F (36.4 C) 97.9 F (36.6 C) 97.5 F (36.4 C)   TempSrc: Oral Oral Oral   Resp: 20 18 18    Height:      Weight:      SpO2: 96% 97% 94% 93%    Intake/Output Summary (Last 24 hours) at 08/20/15 1236 Last data filed at 08/20/15 0849  Gross per 24 hour  Intake    780 ml  Output    650 ml  Net    130 ml   Filed Weights   08/19/15 0043  Weight: 78.6 kg (173 lb 4.5 oz)    Examination:  General exam: Appears calm and comfortable, no distress, chronically ill and appears much older than stated age Respiratory system: diminished at bases Cardiovascular system: S1 & S2 heard, RRR. No JVD, murmurs, rubs, gallops or clicks. No pedal edema. Gastrointestinal system: Abdomen is distended, tense with fluid thrill, soft and nontender. Normal bowel sounds heard. Central nervous system: Alert and oriented. No focal neurological deficits. Extremities: Symmetric 5 x 5 power. Skin: No rashes, lesions or ulcers Psychiatry: Judgement and insight appear normal. Mood & affect appropriate.     Data Reviewed: I have personally reviewed following labs and imaging studies  CBC:  Recent Labs Lab 08/18/15 1946 08/19/15 0356 08/20/15 0411  WBC 4.0  --  5.4  NEUTROABS 2.3  --   --   HGB 13.9 12.9* 11.6*  HCT 39.9 36.4* 32.5*  MCV 105.8*  --  104.5*  PLT 85*  --  58*   Basic Metabolic Panel:  Recent Labs Lab 08/18/15 1946 08/19/15 0356 08/20/15 0411  NA 138 138 138  K 3.6 3.9 4.3  CL 108 110 111  CO2 24 22 24   GLUCOSE 116* 193* 115*  BUN 8 9 10   CREATININE 0.72 0.66 0.56*  CALCIUM 7.7* 7.9* 7.8*    GFR: Estimated Creatinine Clearance: 112.9 mL/min (by C-G formula based on Cr of 0.56). Liver Function Tests:  Recent Labs Lab 08/18/15 1946 08/19/15 0356 08/20/15 0411  AST 88* 75* 69*  ALT 40 38 38  ALKPHOS 64 55 51  BILITOT 1.3* 1.4* 0.8  PROT 6.5 6.1* 5.1*  ALBUMIN 2.4* 2.2* 1.9*   No results for input(s): LIPASE, AMYLASE in the last 168 hours. No results for input(s): AMMONIA in the last 168 hours. Coagulation Profile:  Recent Labs Lab 08/19/15 0105  INR 1.80*   Cardiac Enzymes: No results for input(s): CKTOTAL, CKMB, CKMBINDEX, TROPONINI in the last 168 hours. BNP (last 3 results) No results for input(s): PROBNP in the last 8760 hours. HbA1C: No results for input(s): HGBA1C in the last 72 hours. CBG:  Recent Labs Lab 08/19/15 0746 08/20/15 0732  GLUCAP 166* 118*   Lipid Profile: No results for input(s): CHOL, HDL, LDLCALC, TRIG, CHOLHDL, LDLDIRECT in the last 72 hours. Thyroid Function Tests: No results for input(s): TSH, T4TOTAL, FREET4,  T3FREE, THYROIDAB in the last 72 hours. Anemia Panel: No results for input(s): VITAMINB12, FOLATE, FERRITIN, TIBC, IRON, RETICCTPCT in the last 72 hours. Urine analysis:    Component Value Date/Time   COLORURINE AMBER* 08/18/2015 2049   APPEARANCEUR CLOUDY* 08/18/2015 2049   LABSPEC 1.030 08/18/2015 2049   PHURINE 5.5 08/18/2015 2049   GLUCOSEU NEGATIVE 08/18/2015 2049   HGBUR NEGATIVE 08/18/2015 2049   BILIRUBINUR NEGATIVE 08/18/2015 2049   KETONESUR NEGATIVE 08/18/2015 2049   PROTEINUR NEGATIVE 08/18/2015 2049   NITRITE NEGATIVE 08/18/2015 2049   LEUKOCYTESUR NEGATIVE 08/18/2015 2049   Sepsis Labs: @LABRCNTIP (procalcitonin:4,lacticidven:4)  ) Recent Results (from the past 240 hour(s))  Urine culture     Status: None   Collection Time: 08/18/15  8:49 PM  Result Value Ref Range Status   Specimen Description URINE, RANDOM  Final   Special Requests NONE  Final   Culture NO GROWTH Performed at [facility]   Final   Report Status 08/20/2015 FINAL  Final         Radiology Studies: Dg Chest 2 View  08/18/2015  CLINICAL DATA:  Chest pain for 3 days.  Patient with PleurX catheter EXAM: CHEST  2 VIEW COMPARISON:  08/04/2015 FINDINGS: The cardiomediastinal silhouette is unremarkable. A right pleural catheter again identified. Small bilateral pleural effusions are noted with mild bibasilar atelectasis. There is no evidence of focal airspace disease, pulmonary edema, suspicious pulmonary nodule/mass, or pneumothorax. No acute bony abnormalities are identified. IMPRESSION: Small bilateral pleural effusions with mild bibasilar atelectasis. Electronically Signed   By: Harmon Pier M.D.   On: 08/18/2015 21:00   Ct Angio Chest Pe W/cm &/or Wo Cm  08/18/2015  CLINICAL DATA:  Hemoptysis EXAM: CT ANGIOGRAPHY CHEST WITH CONTRAST TECHNIQUE: Multidetector CT imaging of the chest was performed using the standard protocol during bolus administration of intravenous contrast. Multiplanar CT image reconstructions and MIPs were obtained to evaluate the vascular anatomy. CONTRAST:  165 cc Isovue 370 intravenous (given in divided doses due to suboptimal bolus timing on initial exam). COMPARISON:  08/07/2015 FINDINGS: THORACIC INLET/BODY WALL: No acute abnormality. MEDIASTINUM: Normal heart size.  No pericardial effusion. Limited opacification of the pulmonary arterial tree requiring repeat exam. Second bolus is also suboptimal, but improved. There is no indication of pulmonary embolism, with adequate visualization at least to the segmental level. No acute aortic finding.  B Orderline lower esophageal thickening, improved from previous. No discrete periesophageal varix noted. LUNG WINDOWS: Tunneled pleural catheter on the right with stable small residual pleural fluid. TThe catheter is in unremarkable position. here is a new even smaller left pleural effusion. Mild dependent atelectasis. No consolidation, edema, or  pneumothorax. No central airway lesion or obstruction. No visible alveolar hemorrhage. UPPER ABDOMEN: Cirrhosis with splenomegaly and ascites. No change in the epigastrium since prior. OSSEOUS: No acute fracture.  No suspicious lytic or blastic lesions. Review of the MIP images confirms the above findings. IMPRESSION: 1. No evidence of pulmonary embolism. No specific explanation for hemoptysis. 2. Small pleural effusions, stable on the right and new on the left since 08/07/2015. Right tunneled pleural catheter in good position. 3. Cirrhosis with ascites. Electronically Signed   By: Marnee Spring M.D.   On: 08/18/2015 22:43        Scheduled Meds: . fluticasone furoate-vilanterol  1 puff Inhalation Daily  . pantoprazole  40 mg Oral Daily   Continuous Infusions:       Time spent:    Zannie Cove, MD [facility] Pager  [phone]  If 7PM-7AM, please contact night-coverage [email or web address] Password [facility] 08/20/2015, 12:36 PM

## 2015-08-21 ENCOUNTER — Encounter: Payer: Self-pay | Admitting: *Deleted

## 2015-08-21 ENCOUNTER — Observation Stay (HOSPITAL_COMMUNITY): Payer: BLUE CROSS/BLUE SHIELD

## 2015-08-21 DIAGNOSIS — K7460 Unspecified cirrhosis of liver: Secondary | ICD-10-CM | POA: Diagnosis not present

## 2015-08-21 DIAGNOSIS — R042 Hemoptysis: Secondary | ICD-10-CM | POA: Diagnosis not present

## 2015-08-21 DIAGNOSIS — R1084 Generalized abdominal pain: Secondary | ICD-10-CM

## 2015-08-21 DIAGNOSIS — J9611 Chronic respiratory failure with hypoxia: Secondary | ICD-10-CM | POA: Diagnosis not present

## 2015-08-21 LAB — BASIC METABOLIC PANEL
ANION GAP: 4 — AB (ref 5–15)
BUN: 9 mg/dL (ref 6–20)
CALCIUM: 7.6 mg/dL — AB (ref 8.9–10.3)
CO2: 26 mmol/L (ref 22–32)
Chloride: 109 mmol/L (ref 101–111)
Creatinine, Ser: 0.53 mg/dL — ABNORMAL LOW (ref 0.61–1.24)
GFR calc non Af Amer: >60 mL/min (ref >60)
GLUCOSE: 93 mg/dL (ref 65–99)
Potassium: 3.6 mmol/L (ref 3.5–5.1)
SODIUM: 139 mmol/L (ref 135–145)

## 2015-08-21 LAB — CBC
HEMATOCRIT: 35 % — AB (ref 39.0–52.0)
HEMOGLOBIN: 12.3 g/dL — AB (ref 13.0–17.0)
MCH: 37.7 pg — ABNORMAL HIGH (ref 26.0–34.0)
MCHC: 35.1 g/dL (ref 30.0–36.0)
MCV: 107.4 fL — ABNORMAL HIGH (ref 78.0–100.0)
Platelets: 69 10*3/uL — ABNORMAL LOW (ref 150–400)
RBC: 3.26 MIL/uL — AB (ref 4.22–5.81)
RDW: 13.8 % (ref 11.5–15.5)
WBC: 3.1 10*3/uL — ABNORMAL LOW (ref 4.0–10.5)

## 2015-08-21 LAB — GLUCOSE, CAPILLARY: Glucose-Capillary: 95 mg/dL (ref 65–99)

## 2015-08-21 MED ORDER — POLYETHYLENE GLYCOL 3350 17 G PO PACK: 17.0000 g | PACK | Freq: Every day | ORAL | Status: AC | PRN

## 2015-08-21 NOTE — Care Management Note (Signed)
Case Management Note  Patient Details  Name: Roy Case MRN: [id number] Date of Birth: Mar 19, 1965  Subjective/Objective:                  Hemoptysis  - suspect related to cough, recent bronchitis Action/Plan: Discharge planning Expected Discharge Date:  08/21/15               Expected Discharge Plan:  Home/Self Care  In-House Referral:     Discharge planning Services  CM Consult  Post Acute Care Choice:  NA Choice offered to:  NA  DME Arranged:  N/A DME Agency:  NA  HH Arranged:  NA HH Agency:  NA  Status of Service:  Completed, signed off  Medicare Important Message Given:    Date Medicare IM Given:    Medicare IM give by:    Date Additional Medicare IM Given:    Additional Medicare Important Message give by:     If discussed at [place] of Stay Meetings, dates discussed:    Additional Comments: CM met with pt to discuss discharge needs. Pt confirms he does NOT have Pleurex drain system and just had fluid pulled off (negating need for drain containers and HHRN).  Pt has home O2 from [facility] and transportation is bringing portable tank to room for transport home; RN aware.  No other CM needs were communicated. Dellie Catholic, RN 08/21/2015, 11:12 AM

## 2015-08-21 NOTE — Progress Notes (Signed)
Discharge instructions discussed with patient and family, verbalized understanding and agreement, Prescriptions given to patient 

## 2015-08-22 ENCOUNTER — Telehealth: Payer: Self-pay

## 2015-08-22 ENCOUNTER — Emergency Department (HOSPITAL_COMMUNITY): Payer: BLUE CROSS/BLUE SHIELD

## 2015-08-22 ENCOUNTER — Emergency Department (HOSPITAL_COMMUNITY)
Admission: EM | Admit: 2015-08-22 | Discharge: 2015-08-23 | Disposition: A | Discharge status: Home / Self Care | Payer: BLUE CROSS/BLUE SHIELD | Attending: Emergency Medicine | Admitting: Emergency Medicine

## 2015-08-22 ENCOUNTER — Encounter (HOSPITAL_COMMUNITY): Payer: Self-pay | Admitting: Emergency Medicine

## 2015-08-22 DIAGNOSIS — Z9981 Dependence on supplemental oxygen: Secondary | ICD-10-CM | POA: Insufficient documentation

## 2015-08-22 DIAGNOSIS — Z79899 Other long term (current) drug therapy: Secondary | ICD-10-CM | POA: Insufficient documentation

## 2015-08-22 DIAGNOSIS — I10 Essential (primary) hypertension: Secondary | ICD-10-CM | POA: Diagnosis not present

## 2015-08-22 DIAGNOSIS — R531 Weakness: Secondary | ICD-10-CM | POA: Diagnosis present

## 2015-08-22 DIAGNOSIS — R600 Localized edema: Secondary | ICD-10-CM | POA: Diagnosis not present

## 2015-08-22 DIAGNOSIS — R0602 Shortness of breath: Secondary | ICD-10-CM | POA: Diagnosis not present

## 2015-08-22 DIAGNOSIS — Z85828 Personal history of other malignant neoplasm of skin: Secondary | ICD-10-CM | POA: Insufficient documentation

## 2015-08-22 DIAGNOSIS — J45909 Unspecified asthma, uncomplicated: Secondary | ICD-10-CM | POA: Diagnosis not present

## 2015-08-22 LAB — GRAM STAIN

## 2015-08-22 LAB — BASIC METABOLIC PANEL
Anion gap: 5 (ref 5–15)
BUN: 11 mg/dL (ref 6–20)
CALCIUM: 7.8 mg/dL — AB (ref 8.9–10.3)
CO2: 26 mmol/L (ref 22–32)
CREATININE: 0.62 mg/dL (ref 0.61–1.24)
Chloride: 109 mmol/L (ref 101–111)
GFR calc non Af Amer: >60 mL/min (ref >60)
Glucose, Bld: 91 mg/dL (ref 65–99)
Potassium: 3.4 mmol/L — ABNORMAL LOW (ref 3.5–5.1)
Sodium: 140 mmol/L (ref 135–145)

## 2015-08-22 LAB — CBC WITH DIFFERENTIAL/PLATELET
BASOS PCT: 1 %
Basophils Absolute: 0 10*3/uL (ref 0.0–0.1)
EOS ABS: 0.2 10*3/uL (ref 0.0–0.7)
Eosinophils Relative: 4 %
HEMATOCRIT: 39.4 % (ref 39.0–52.0)
Hemoglobin: 13.7 g/dL (ref 13.0–17.0)
Lymphocytes Relative: 15 %
Lymphs Abs: 0.6 10*3/uL — ABNORMAL LOW (ref 0.7–4.0)
MCH: 36.6 pg — ABNORMAL HIGH (ref 26.0–34.0)
MCHC: 34.8 g/dL (ref 30.0–36.0)
MCV: 105.3 fL — ABNORMAL HIGH (ref 78.0–100.0)
MONO ABS: 0.4 10*3/uL (ref 0.1–1.0)
Monocytes Relative: 11 %
NEUTROS ABS: 2.6 10*3/uL (ref 1.7–7.7)
Neutrophils Relative %: 69 %
Platelets: 72 10*3/uL — ABNORMAL LOW (ref 150–400)
RBC: 3.74 MIL/uL — ABNORMAL LOW (ref 4.22–5.81)
RDW: 13.6 % (ref 11.5–15.5)
WBC: 3.8 10*3/uL — ABNORMAL LOW (ref 4.0–10.5)

## 2015-08-22 NOTE — Discharge Instructions (Signed)

## 2015-08-22 NOTE — ED Notes (Signed)
[facility] [employer] notified of need for transport of pt back to his residence.

## 2015-08-22 NOTE — ED Notes (Signed)
Swelling in bilateral lower extremities has decreased to a +2.

## 2015-08-22 NOTE — ED Notes (Signed)
MD at bedside. 

## 2015-08-22 NOTE — Telephone Encounter (Signed)
Home health nurse called. I spoke with Dr. Carver Filaalone and he advise to go to er or call surgeon.

## 2015-08-22 NOTE — ED Provider Notes (Signed)
CSN: [id number]     Arrival date & time 08/22/15  1956 History   First MD Initiated Contact with Patient 08/22/15 2024     Chief Complaint  Patient presents with  . Leg Swelling  . Weakness     (Consider location/radiation/quality/duration/timing/severity/associated sxs/prior Treatment) HPI Comments: Patient here with worsening chronic leg swelling due to his cirrhosis. Was discharged from the hospital yesterday for similar symptoms. Patient endorses increased dyspnea and is on home oxygen at 5 L chronically. Patient had a paracentesis done on the hospital states he feels some increase in his abdominal girth. Denies any fever or chills. No vomiting. No black or bloody stools. Is currently taking diuretics and has been compliant.  Patient is a 50 y.o. male presenting with weakness. The history is provided by the patient.  Weakness    Past Medical History  Diagnosis Date  . Hypertension   . Allergic rhinitis   . Asthma   . High cholesterol   . Cirrhosis ([place])   . 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 History of home oxygen therapy     4 liters all the time   Past Surgical History  Procedure Laterality Date  . Cholecystectomy  may 2014  . Skin cancer excision Left     arm  . Carpal tunnel release Right   . Cardiac catheterization N/A 05/31/2015    Procedure: Right Heart Cath;  Surgeon: Larey Dresser, MD;  Location: [facility];  Service: Cardiovascular;  Laterality: N/A;  . Bone marrow biopsy  05-30-15   Family History  Problem Relation Age of Onset  . Lung cancer Mother   . Colon cancer Neg Hx   . Esophageal cancer Neg Hx   . Pancreatic cancer Neg Hx   . Kidney disease Neg Hx   . Liver disease Neg Hx    Social History  Substance Use Topics  . Smoking status: Never Smoker   . Smokeless tobacco: Never Used  . Alcohol Use: No    Review of Systems  Neurological: Positive for weakness.  All other systems reviewed and are negative.     Allergies   Penicillins; Acetaminophen; Azithromycin; Cefdinir; and Doxycycline  Home Medications   Prior to Admission medications   Medication Sig Start Date End Date Taking? Authorizing Provider  albuterol (PROVENTIL HFA;VENTOLIN HFA) 108 (90 Base) MCG/ACT inhaler Inhale 2 puffs into the lungs every 6 (six) hours as needed for wheezing or shortness of breath. 06/06/15   Juanito Doom, MD  albuterol (PROVENTIL) (2.5 MG/3ML) 0.083% nebulizer solution Take 3 mLs (2.5 mg total) by nebulization every 4 (four) hours as needed for wheezing or shortness of breath. 05/03/15   Golden Circle, FNP  ALPRAZolam Duanne Moron) 0.5 MG tablet Take 0.5 mg by mouth 2 (two) times daily as needed for anxiety.     Historical Provider, MD  fluticasone furoate-vilanterol (BREO ELLIPTA) 100-25 MCG/INH AEPB Inhale 1 puff into the lungs daily. Reported on 05/16/2015 05/16/15   Marijean Heath, NP  furosemide (LASIX) 40 MG tablet Take 1 tablet (40 mg total) by mouth daily. Patient taking differently: Take 40 mg by mouth 2 (two) times daily.  06/27/15   Volanda Napoleon, MD  OXYGEN Inhale 4.5-5 L into the lungs continuous.     Historical Provider, MD  pantoprazole (PROTONIX) 40 MG tablet Take 40 mg by mouth daily.    Historical Provider, MD  polyethylene glycol (MIRALAX / GLYCOLAX) packet Take 17 g by mouth daily as needed for  mild constipation. 08/21/15   Domenic Polite, MD  potassium chloride SA (K-DUR,KLOR-CON) 20 MEQ tablet Take 20 mEq by mouth daily.    Historical Provider, MD  spironolactone (ALDACTONE) 25 MG tablet Take 25 mg by mouth 2 (two) times daily.    Historical Provider, MD   BP 107/95 mmHg  Pulse 69  Temp(Src) 97.6 F (36.4 C) (Oral)  Resp 18  SpO2 94% Physical Exam  Constitutional: He is oriented to person, place, and time. He appears well-developed and well-nourished.  Non-toxic appearance. No distress.  HENT:  Head: Normocephalic and atraumatic.  Eyes: Conjunctivae, EOM and lids are normal. Pupils are equal,  round, and reactive to light.  Neck: Normal range of motion. Neck supple. No tracheal deviation present. No thyroid mass present.  Cardiovascular: Normal rate, regular rhythm and normal heart sounds.  Exam reveals no gallop.   No murmur heard. Pulmonary/Chest: Effort normal and breath sounds normal. No stridor. No respiratory distress. He has no decreased breath sounds. He has no wheezes. He has no rhonchi. He has no rales.  Abdominal: Soft. Normal appearance and bowel sounds are normal. He exhibits distension and ascites. He exhibits no fluid wave. There is no tenderness. There is no rebound and no CVA tenderness.  Musculoskeletal: Normal range of motion. He exhibits no edema or tenderness.  3+ bilateral lower extremity edema  Neurological: He is alert and oriented to person, place, and time. He has normal strength. No cranial nerve deficit or sensory deficit. GCS eye subscore is 4. GCS verbal subscore is 5. GCS motor subscore is 6.  Skin: Skin is warm and dry. No abrasion and no rash noted.  Psychiatric: He has a normal mood and affect. His speech is normal and behavior is normal.  Nursing note and vitals reviewed.   ED Course  Procedures (including critical care time) Labs Review Labs Reviewed  CBC WITH DIFFERENTIAL/PLATELET  BASIC METABOLIC PANEL    Imaging Review Dg Chest 2 View  08/21/2015  CLINICAL DATA:  Still cough, congestion, sob this a.m, EXAM: CHEST  2 VIEW COMPARISON:  CT 08/18/2015 FINDINGS: Right tunneled pleural catheter stable in position. No pneumothorax. Small bilateral pleural effusions. Associated subsegmental atelectasis or infiltrate posteriorly in both lower lobes as before. No pneumothorax. Heart size normal. The visualized skeletal structures are unremarkable. Cholecystectomy clips. IMPRESSION: 1. Stable small pleural effusions, with adjacent atelectasis/ infiltrate in the posterior lower lobes. 2. Stable right tunneled chest tube without pneumothorax. Electronically  Signed   By: Lucrezia Europe M.D.   On: 08/21/2015 10:03   I have personally reviewed and evaluated these images and lab results as part of my medical decision-making.   EKG Interpretation None      MDM   Final diagnoses:  SOB (shortness of breath)    Chest x-ray and labs without acute changes. Instructed to follow-up with his doctor to have his diuretics adjusted    Lacretia Leigh, MD 08/22/15 2227

## 2015-08-22 NOTE — ED Notes (Signed)
Bed: [id number] Expected date:  Expected time:  Means of arrival:  Comments: 50 yo M  Ascites

## 2015-08-22 NOTE — ED Notes (Signed)
Pt from home and reports increasing swelling in lower extremities and had been discharged from hospital on 08/21/15 after having removal of fluid from abdomen. PT states that he is having no change in swelling with elevation of legs or increase in fluid pills. Pt reports being able to urinate without difficulty at home. Also reports increasing weakness today. Pt alert and oriented x 4 no neurological deficits noted. Pt on 5L O2 via [place] at all times.

## 2015-08-23 NOTE — ED Notes (Signed)
Pt reports understanding of discharge information. No questions at time of discharge 

## 2015-08-23 NOTE — ED Notes (Signed)
[facility] here to transport pt ?

## 2015-08-24 ENCOUNTER — Encounter: Payer: Self-pay | Admitting: Family

## 2015-08-24 LAB — CULTURE, BLOOD (ROUTINE X 2)
CULTURE: NO GROWTH
CULTURE: NO GROWTH

## 2015-08-25 LAB — CULTURE, BODY FLUID-BOTTLE: CULTURE: NO GROWTH

## 2015-08-25 LAB — CULTURE, BODY FLUID W GRAM STAIN -BOTTLE

## 2015-08-27 ENCOUNTER — Encounter: Payer: Self-pay | Admitting: Family

## 2015-08-27 ENCOUNTER — Ambulatory Visit (INDEPENDENT_AMBULATORY_CARE_PROVIDER_SITE_OTHER): Payer: BLUE CROSS/BLUE SHIELD | Admitting: Family

## 2015-08-27 VITALS — BP 140/94 | HR 81 | Temp 98.2°F | Resp 22 | Ht 69.0 in | Wt 180.0 lb

## 2015-08-27 DIAGNOSIS — K7460 Unspecified cirrhosis of liver: Secondary | ICD-10-CM | POA: Diagnosis not present

## 2015-08-27 DIAGNOSIS — R188 Other ascites: Principal | ICD-10-CM

## 2015-08-27 MED ORDER — CODEINE SULFATE 30 MG PO TABS: 15.0000 mg | ORAL_TABLET | Freq: Four times a day (QID) | ORAL | Status: DC | PRN

## 2015-08-27 NOTE — Progress Notes (Signed)
08-27-15 Spoke with pt 08-24-15 "will call office today to reschedule 08-28-15 procedure" pt. States has conflict with [place] appt [place] .

## 2015-08-27 NOTE — Assessment & Plan Note (Addendum)
Symptoms and exam consistent with ascites most likely related to hepatic cirrhosis and inability to withdraw fluid from chest tube. Previous imaging indicates chest tube well placed. Recently drawn 900 cc of fluid via paracentesis. No encephalopathy noted. Has appointment with general surgery in the next 3 days. Will attempt medical treatment with increased Lasix and potassium. Increase Lasix to 80 mg twice daily for 3 days with increased potassium. Follow up with general surgery as scheduled. Will need recheck of potassium and fluid levels. Start codeine as needed for discomfort with concern for respiratory depression. Patient understands risk and wishes to continue. Continue to monitor.

## 2015-08-27 NOTE — Telephone Encounter (Signed)
Per Noreene LarssonJill at [facility], patient cancelled procedure for tomorrow as he has an appointment for liver transplant tomorrow.

## 2015-08-27 NOTE — Patient Instructions (Signed)
Thank you for choosing [facility].  Summary/Instructions:  Please increase your Lasix to 80 mg twice daily for 3 days.   Please increase your potassium to 2x per day.   Codeine 0.5-1 tablet as needed. Watch for sleepiness.   Follow up with general surgery as scheduled.   Your prescription(s) have been submitted to your pharmacy or been printed and provided for you. Please take as directed and contact our office if you believe you are having problem(s) with the medication(s) or have any questions.  If your symptoms worsen or fail to improve, please contact our office for further instruction, or in case of emergency go directly to the emergency room at the closest medical facility.

## 2015-08-27 NOTE — Progress Notes (Signed)
Subjective:    Patient ID: Roy Case, male    DOB: 1965/05/07, 50 y.o.   MRN: [id number]  Chief Complaint  Patient presents with  . Bloated    x2 weeks has been feeling very bloated, can not eat, feels like everything is being pushed up into his chest, has not been able to sleep, SOB, feels a sharp stabbing pain in the middle of abdomen    HPI:  Roy Case is a 50 y.o. male who  has a past medical history of Hypertension; Allergic rhinitis; Asthma; High cholesterol; Cirrhosis (HCC); Idiopathic hemochromatosis (HCC) (06/04/2015); and History of home oxygen therapy. and presents today for an acute office visit.   Abdominal bloating - Continues to experience the associated symptom of abdominal bloating with a severity that effects his ability to eat, sleep and causes shortness of breath. He did have 900 cc removed via paracentesis on 6/5. Describes that his abdomen is tight and feels like someone is stabbing him the abdomen when touched. He is able to consume some fluid however he is not able to eat. When he does try to eat he has fullness. No fevers. Once again unable to drain fluid from the chest tube.   Allergies  Allergen Reactions  . Penicillins Anaphylaxis and Other (See Comments)    Has patient had a PCN reaction causing immediate rash, facial/tongue/throat swelling, SOB or lightheadedness with hypotension: YES Has patient had a PCN reaction causing severe rash involving mucus membranes or skin necrosis:NO Has patient had a PCN reaction occurring within the last 10 years: NO If all of the above answers are "NO", then may proceed with Cephalosporin use.  . Acetaminophen     Reduced liver function   . Azithromycin Other (See Comments)    Blisters on mouth  . Cefdinir     Blisters in mouth   . Doxycycline Rash     Current Outpatient Prescriptions on File Prior to Visit  Medication Sig Dispense Refill  . albuterol (PROVENTIL HFA;VENTOLIN HFA) 108 (90 Base) MCG/ACT  inhaler Inhale 2 puffs into the lungs every 6 (six) hours as needed for wheezing or shortness of breath. 1 Inhaler 3  . albuterol (PROVENTIL) (2.5 MG/3ML) 0.083% nebulizer solution Take 3 mLs (2.5 mg total) by nebulization every 4 (four) hours as needed for wheezing or shortness of breath. 75 mL 2  . ALPRAZolam (XANAX) 0.5 MG tablet Take 0.5 mg by mouth 2 (two) times daily as needed for anxiety.     . fluticasone furoate-vilanterol (BREO ELLIPTA) 100-25 MCG/INH AEPB Inhale 1 puff into the lungs daily. Reported on 05/16/2015 28 each 1  . furosemide (LASIX) 40 MG tablet Take 1 tablet (40 mg total) by mouth daily. (Patient taking differently: Take 40 mg by mouth 2 (two) times daily. ) 30 tablet 3  . ibuprofen (ADVIL,MOTRIN) 800 MG tablet TK 1 T PO TID PRN PAIN  0  . OXYGEN Inhale 4.5-5 L into the lungs continuous.     . pantoprazole (PROTONIX) 40 MG tablet Take 40 mg by mouth daily.    . polyethylene glycol (MIRALAX / GLYCOLAX) packet Take 17 g by mouth daily as needed for mild constipation. 14 each 0  . potassium chloride SA (K-DUR,KLOR-CON) 20 MEQ tablet Take 20 mEq by mouth daily.    Marland Kitchen. spironolactone (ALDACTONE) 25 MG tablet Take 25 mg by mouth 2 (two) times daily.     No current facility-administered medications on file prior to visit.     Review  of Systems  Constitutional: Negative for fever and chills.  Respiratory: Positive for shortness of breath. Negative for chest tightness.   Cardiovascular: Positive for leg swelling. Negative for chest pain and palpitations.  Gastrointestinal: Positive for abdominal pain and abdominal distention. Negative for nausea, vomiting, diarrhea and constipation.      Objective:    BP 140/94 mmHg  Pulse 81  Temp(Src) 98.2 F (36.8 C) (Oral)  Resp 22  Ht  (1.753 m)  Wt 180 lb (81.647 kg)  BMI 26.57 kg/m2  SpO2 85% Nursing note and vital signs reviewed.  Physical Exam  Constitutional: He is oriented to person, place, and time. He appears  well-developed and well-nourished. No distress.  Cardiovascular: Normal rate, regular rhythm, normal heart sounds and intact distal pulses.   Pulmonary/Chest: Effort normal and breath sounds normal.  Abdominal: He exhibits distension and ascites. There is generalized tenderness. There is no rigidity, no rebound, no guarding, no tenderness at McBurney's point and negative Murphy's sign.  Neurological: He is alert and oriented to person, place, and time.  Skin: Skin is warm and dry.  Psychiatric: He has a normal mood and affect. His behavior is normal. Judgment and thought content normal.       Assessment & Plan:   Problem List Items Addressed This Visit      Digestive   Hepatic cirrhosis (HCC) - Primary    Symptoms and exam consistent with ascites most likely related to hepatic cirrhosis and inability to withdraw fluid from chest tube. Previous imaging indicates chest tube well placed. Recently drawn 900 cc of fluid via paracentesis. No encephalopathy noted. Has appointment with general surgery in the next 3 days. Will attempt medical treatment with increased Lasix and potassium. Increase Lasix to 80 mg twice daily for 3 days with increased potassium. Follow up with general surgery as scheduled. Will need recheck of potassium and fluid levels. Start codeine as needed for discomfort with concern for respiratory depression. Patient understands risk and wishes to continue. Continue to monitor.           I am having Roy Case start on codeine. I am also having him maintain his ALPRAZolam, OXYGEN, albuterol, fluticasone furoate-vilanterol, albuterol, furosemide, spironolactone, potassium chloride SA, pantoprazole, polyethylene glycol, and ibuprofen.   Meds ordered this encounter  Medications  . codeine 30 MG tablet    Sig: Take 0.5-1 tablets (15-30 mg total) by mouth every 6 (six) hours as needed.    Dispense:  30 tablet    Refill:  0    Order Specific Question:  Supervising Provider     Answer:  Hillard Danker A [[id number]]     Follow-up: Return in about 2 weeks (around 09/10/2015).  Jeanine Luz, FNP

## 2015-08-28 ENCOUNTER — Encounter: Payer: Self-pay | Admitting: Internal Medicine

## 2015-08-28 ENCOUNTER — Ambulatory Visit (HOSPITAL_COMMUNITY)
Admission: RE | Admit: 2015-08-28 | Payer: BLUE CROSS/BLUE SHIELD | Source: Ambulatory Visit | Admitting: Internal Medicine

## 2015-08-28 HISTORY — DX: Dependence on supplemental oxygen: Z99.81

## 2015-08-28 SURGERY — ESOPHAGOGASTRODUODENOSCOPY (EGD) WITH PROPOFOL
Anesthesia: Monitor Anesthesia Care

## 2015-09-04 ENCOUNTER — Telehealth: Payer: Self-pay | Admitting: Family

## 2015-09-04 ENCOUNTER — Telehealth: Payer: Self-pay

## 2015-09-04 ENCOUNTER — Telehealth: Payer: Self-pay | Admitting: *Deleted

## 2015-09-04 DIAGNOSIS — Z5181 Encounter for therapeutic drug level monitoring: Secondary | ICD-10-CM

## 2015-09-04 NOTE — Telephone Encounter (Signed)
Please check with patient if he has had improved symptoms. If they are improved, please have him stop in the lab to check his electrolytes. Otherwise if not please let me know for additional treatments.

## 2015-09-04 NOTE — Telephone Encounter (Signed)
Please advise 

## 2015-09-04 NOTE — Telephone Encounter (Signed)
===  View-only below this line===  ----- Message -----    From: Richardson Chiquitoorothy N Dosha Broshears, CMA    Sent: 09/04/2015   8:09 AM      To: Richardson Chiquitoorothy N Erleen Egner, CMA Subject: FW: Inpatient Notes                             I have again contacted patient to discuss setting up endoscopy and possible colonoscopy.  He states that he still hasnt heard back from his transplant team. I have advised that I will await a return call from him before scheduling any procedures. He verbalizes understanding.  ----- Message -----    From: Richardson Chiquitoorothy N Keijuan Schellhase, CMA    Sent: 09/04/2015      To: Richardson Chiquitoorothy N Lainey Nelson, CMA Subject: FW: Inpatient Notes                            Patient states that he left a message yesterday for his transplant team and is awaiting their return call with regards to endoscopy and possible colonoscopy.  ----- Message -----    From: Richardson Chiquitoorothy N Karlynn Furrow, CMA    Sent: 08/30/2015      To: Richardson Chiquitoorothy N Kingston Guiles, CMA Subject: FW: Inpatient Notes                            Patient states that he is getting testing done for abdominal swelling today and doesn't think he can have the test tomorrow. He also states that he will check with liver transplant team to see if he needs colonoscopy added to endoscopy as a preop requirement.  ----- Message -----    From: Beverley FiedlerJay M Pyrtle, MD    Sent: 08/27/2015   9:22 AM      To: Richardson Chiquitoorothy N Kevin Space, CMA Subject: RE: Inpatient Notes                            Please reschedule EGD for variceal screening on next hospital endo day in July If he cannot, then needs OV JMP  ----- Message -----    From: Patrici RanksWilhemina S Hendrick, RN    Sent: 08/27/2015   9:09 AM      To: Beverley FiedlerJay M Pyrtle, MD Subject: Inpatient Notes                                Pt states he will reschedule 08-28-15 procedure

## 2015-09-04 NOTE — Telephone Encounter (Signed)
States patient was referred to them.  Did not see patient because patient needs to be referred to a thoracic surgeon.

## 2015-09-04 NOTE — Telephone Encounter (Signed)
Patient would like you to call him. He has some questions about his fluid pills. Please follow up. Thank you

## 2015-09-04 NOTE — Telephone Encounter (Signed)
Dr. Pyrtle- FYI 

## 2015-09-04 NOTE — Telephone Encounter (Signed)
Pt is wanting to know if he is supposed to continue to take 2 fluid pills BID or did you want him to go back to 1 pill bid?

## 2015-09-05 ENCOUNTER — Other Ambulatory Visit (INDEPENDENT_AMBULATORY_CARE_PROVIDER_SITE_OTHER): Payer: BLUE CROSS/BLUE SHIELD

## 2015-09-05 ENCOUNTER — Encounter: Payer: Self-pay | Admitting: Family

## 2015-09-05 DIAGNOSIS — Z5181 Encounter for therapeutic drug level monitoring: Secondary | ICD-10-CM

## 2015-09-05 LAB — BASIC METABOLIC PANEL
BUN: 10 mg/dL (ref 6–23)
CALCIUM: 8.3 mg/dL — AB (ref 8.4–10.5)
CO2: 23 meq/L (ref 19–32)
CREATININE: 0.54 mg/dL (ref 0.40–1.50)
Chloride: 109 mEq/L (ref 96–112)
GFR: 171.86 mL/min (ref >60.00)
GLUCOSE: 106 mg/dL — AB (ref 70–99)
Potassium: 3.7 mEq/L (ref 3.5–5.1)
Sodium: 138 mEq/L (ref 135–145)

## 2015-09-05 NOTE — Telephone Encounter (Signed)
Sxs have improved as far as swelling in legs and feet. Still having some bloating in his stomach. He is going to come in today to get electrolytes checked. Orders have been put in for pt.

## 2015-09-06 NOTE — Telephone Encounter (Signed)
Message sent via MyChart.

## 2015-09-10 NOTE — Discharge Summary (Signed)
Physician Discharge Summary  Roy Case VHQ:[id number] DOB: [birth year] DOA: 08/18/2015  PCP: Roy Luz, FNP  Admit date: 08/18/2015 Discharge date: 08/21/2015  Time spent: 45 minutes  Recommendations for Outpatient Follow-up:  PCP in 1 week, please FU Cytology from ascitic fluid Transplant Evaluation at [facility] on Thursday 6/6 FU with Pulm in 1-2weeks, Output from Pleurx catheter decreasing, may need this removed down the road  Discharge Diagnoses:  Principal Problem:   Cough with hemoptysis Active Problems:   Chronic respiratory failure with hypoxia (HCC)   Hepatic cirrhosis (HCC)   Idiopathic hemochromatosis (HCC)   Thrombocytopenia (HCC)   Pleural effusion associated with hepatic disorder   Abdominal pain, generalized   Discharge Condition: stable  Diet recommendation:low sodium  Filed Weights   08/19/15 0043 08/21/15 0500  Weight: 78.6 kg (173 lb 4.5 oz) 81.1 kg (178 lb 12.7 oz)    History of present illness:  Roy Case is a 50 y.o. male with medical history significant for idiopathic hemachromatosis managed with phlebotomies, liver cirrhosis, chronic respiratory failure with supplemental oxygen requirement of 4-5 L/m, and pleural effusions with Pleurx catheter in the right chest presented to the ED with increased cough and hemoptysis x1day  Hospital Course:   1. Hemoptysis  - suspect related to cough, recent bronchitis, in background of coagulopathy from Cirrhosis and thrombocytopenia - CTA is negative for PE; Pneumonia, no risk factors for TB identified  - resolved  2. Lactic acid elevation  - Lactate 3.10 on presentation, then down to 2.51, trending down - do not suspect sepsis clinically, dehydration only  3. 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 Increased Abd distension -due to increasing ascites clinically and noted on CTA -s/p diagnostic and therapeutic paracentesis by IR 900cc drained, No SBP noted, please FU cytology -having BMs -lasix and aldactone on hold due to elevated lactate, resumed today  5. Idiopathic hemochromatosis  - Followed by hematology in outpatient setting and treated with phlebotomies  - Continue management per hematology   6. Liver cirrhosis  - suspect secondary to hemachromatosis; +/- NASH - Followed by [facility] in outpatient setting  - now with ascites - No EGD on file; no esophageal varices detected on CTA obtained in ED  - Managed with Lasix and Aldactone -referred for Transplant eval, appt in [place] on thursday  7. Pleural effusions - Likely secondary to liver cirrhosis , ? Hepatic hydrothorax - Has Pleurx catheter in right chest, placed 5 wks ago per pt; appears to be in appropriate position on imaging  - Right-sided effusion small and stable relative to recent CT; left-sided effusion is small  - Saturating well on his usual FiO2 /5L  8. UTI  - Pt reports being started on Cipro 08/13/15 for fever and flank pain  - Fevers and pain has resolved - U cx from 6/3: with NGTD  - stopped CIpro, completed 7days on 6/4  9. Thrombocytopenia  - Platelet count 50-80K  - Stable, secondary to  liver cirrhosis and hemachromatosis  - Hold pharmacologic VTE ppx   Procedures:  Paracentesis  Consultations:  IR  Discharge Exam: Filed Vitals:   08/21/15 0631 08/21/15 1514  BP: 112/65 108/68  Pulse: 65 73  Temp: 97.9 F (36.6 C) 98 F (36.7 C)  Resp: 18  18    General: AAOx3 Cardiovascular: S1S2/RRR Respiratory: CTAB  Discharge Instructions   Discharge Instructions    Diet - low sodium heart healthy    Complete by:  As directed      Increase activity slowly    Complete by:  As directed           Discharge Medication List as of 08/21/2015  3:14 PM    START taking these medications   Details  polyethylene glycol (MIRALAX / GLYCOLAX) packet Take 17 g by mouth daily as needed for mild constipation., Starting 08/21/2015, Until Discontinued, Print      CONTINUE these medications which have NOT CHANGED   Details  albuterol (PROVENTIL HFA;VENTOLIN HFA) 108 (90 Base) MCG/ACT inhaler Inhale 2 puffs into the lungs every 6 (six) hours as needed for wheezing or shortness of breath., Starting 06/06/2015, Until Discontinued, Normal    albuterol (PROVENTIL) (2.5 MG/3ML) 0.083% nebulizer solution Take 3 mLs (2.5 mg total) by nebulization every 4 (four) hours as needed for wheezing or shortness of breath., Starting 05/03/2015, Until Discontinued, Normal    ALPRAZolam (XANAX) 0.5 MG tablet Take 0.5 mg by mouth 2 (two) times daily as needed for anxiety. , Until Discontinued, Historical Med    fluticasone furoate-vilanterol (BREO ELLIPTA) 100-25 MCG/INH AEPB Inhale 1 puff into the lungs daily. Reported on 05/16/2015, Starting 05/16/2015, Until Discontinued, Normal    furosemide (LASIX) 40 MG tablet Take 1 tablet (40 mg total) by mouth daily., Starting 06/27/2015, Until Discontinued, Normal    pantoprazole (PROTONIX) 40 MG tablet Take 40 mg by mouth daily., Until Discontinued, Historical Med    potassium chloride SA (K-DUR,KLOR-CON) 20 MEQ tablet Take 20 mEq by mouth daily., Until Discontinued, Historical Med    spironolactone (ALDACTONE) 25 MG tablet Take 25 mg by mouth 2 (two) times daily., Until Discontinued, Historical Med    OXYGEN Inhale 4.5-5 L into the lungs continuous. , Until Discontinued, Historical Med      STOP taking these medications      ciprofloxacin (CIPRO) 500 MG tablet      ibuprofen (ADVIL,MOTRIN) 800 MG tablet      acetaminophen-codeine (TYLENOL #3) 300-30 MG tablet      levofloxacin (LEVAQUIN) 500 MG tablet      propranolol (INDERAL) 40 MG tablet        Allergies  Allergen Reactions  . Penicillins Anaphylaxis and Other (See Comments)    Has patient had a PCN reaction causing immediate rash, facial/tongue/throat swelling, SOB or lightheadedness with hypotension: YES Has patient had a PCN reaction causing severe rash involving mucus membranes or skin necrosis:NO Has patient had a PCN reaction occurring within the last 10 years: NO If all of the above answers are "NO", then may proceed with Cephalosporin use.  . Acetaminophen     Reduced liver function   . Azithromycin Other (See Comments)    Blisters on mouth  . Cefdinir     Blisters in mouth   . Doxycycline Rash      The results of significant diagnostics from this hospitalization (including imaging, microbiology, ancillary and laboratory) are listed below for reference.    Significant Diagnostic Studies: Dg Chest 2 View  08/22/2015  CLINICAL DATA:  Increasing shortness of breath for 2 days, initial encounter EXAM: CHEST  2 VIEW COMPARISON:  08/21/2015 FINDINGS: Cardiac shadow is stable in appearance. A tunneled PleurX catheter is noted on the right. No sizable infiltrate or focal effusion is seen. No bony abnormality is noted. IMPRESSION: No acute abnormality noted. Electronically Signed   By: Alcide Clever M.D.   On: 08/22/2015 21:24   Dg Chest 2 View  08/21/2015  CLINICAL DATA:  Still cough, congestion, sob this a.m, EXAM: CHEST  2 VIEW COMPARISON:  CT 08/18/2015 FINDINGS: Right tunneled pleural catheter stable in position. No pneumothorax. Small bilateral pleural effusions. Associated subsegmental atelectasis or infiltrate posteriorly in both lower lobes as before. No pneumothorax. Heart size normal. The visualized skeletal structures are unremarkable.  Cholecystectomy clips. IMPRESSION: 1. Stable small pleural effusions, with adjacent atelectasis/ infiltrate in the posterior lower lobes. 2. Stable right tunneled chest tube without pneumothorax. Electronically Signed   By: Corlis Leak M.D.   On: 08/21/2015 10:03   Dg Chest 2 View  08/18/2015  CLINICAL DATA:  Chest pain for 3 days.  Patient with PleurX catheter EXAM: CHEST  2 VIEW COMPARISON:  08/04/2015 FINDINGS: The cardiomediastinal silhouette is unremarkable. A right pleural catheter again identified. Small bilateral pleural effusions are noted with mild bibasilar atelectasis. There is no evidence of focal airspace disease, pulmonary edema, suspicious pulmonary nodule/mass, or pneumothorax. No acute bony abnormalities are identified. IMPRESSION: Small bilateral pleural effusions with mild bibasilar atelectasis. Electronically Signed   By: Harmon Pier M.D.   On: 08/18/2015 21:00   Ct Angio Chest Pe W/cm &/or Wo Cm  08/18/2015  CLINICAL DATA:  Hemoptysis EXAM: CT ANGIOGRAPHY CHEST WITH CONTRAST TECHNIQUE: Multidetector CT imaging of the chest was performed using the standard protocol during bolus administration of intravenous contrast. Multiplanar CT image reconstructions and MIPs were obtained to evaluate the vascular anatomy. CONTRAST:  165 cc Isovue 370 intravenous (given in divided doses due to suboptimal bolus timing on initial exam). COMPARISON:  08/07/2015 FINDINGS: THORACIC INLET/BODY WALL: No acute abnormality. MEDIASTINUM: Normal heart size.  No pericardial effusion. Limited opacification of the pulmonary arterial tree requiring repeat exam. Second bolus is also suboptimal, but improved. There is no indication of pulmonary embolism, with adequate visualization at least to the segmental level. No acute aortic finding.  B Orderline lower esophageal thickening, improved from previous. No discrete periesophageal varix noted. LUNG WINDOWS: Tunneled pleural catheter on the right with stable small residual  pleural fluid. TThe catheter is in unremarkable position. here is a new even smaller left pleural effusion. Mild dependent atelectasis. No consolidation, edema, or pneumothorax. No central airway lesion or obstruction. No visible alveolar hemorrhage. UPPER ABDOMEN: Cirrhosis with splenomegaly and ascites. No change in the epigastrium since prior. OSSEOUS: No acute fracture.  No suspicious lytic or blastic lesions. Review of the MIP images confirms the above findings. IMPRESSION: 1. No evidence of pulmonary embolism. No specific explanation for hemoptysis. 2. Small pleural effusions, stable on the right and new on the left since 08/07/2015. Right tunneled pleural catheter in good position. 3. Cirrhosis with ascites. Electronically Signed   By: Marnee Spring M.D.   On: 08/18/2015 22:43   US Paracentesis  08/20/2015  INDICATION: Cirrhosis, ascites. Request made for diagnostic and therapeutic paracentesis. EXAM: ULTRASOUND GUIDED DIAGNOSTIC AND THERAPEUTIC PARACENTESIS MEDICATIONS: None. COMPLICATIONS: None immediate. PROCEDURE: Informed written consent was obtained from the patient after a discussion of the risks, benefits and alternatives to treatment. A timeout was performed prior to  the initiation of the procedure. Initial ultrasound scanning demonstrates a small amount of ascites within the right mid to lower abdominal quadrant. The right mid to lower abdomen was prepped and draped in the usual sterile fashion. 1% lidocaine was used for local anesthesia. Following this, a Yueh catheter was introduced. An ultrasound image was saved for documentation purposes. The paracentesis was performed. The catheter was removed and a dressing was applied. The patient tolerated the procedure well without immediate post procedural complication. FINDINGS: A total of approximately 900 cc of hazy, light yellow fluid was removed. Samples were sent to the laboratory as requested by the clinical team. IMPRESSION: Successful  ultrasound-guided diagnostic and therapeutic paracentesis yielding 900 cc of peritoneal fluid. Read by: Jeananne RamaKevin Allred, PA-C Electronically Signed   By: Simonne ComeJohn  Watts M.D.   On: 08/20/2015 16:07    Microbiology: No results found for this or any previous visit (from the past 240 hour(s)).   Labs: Basic Metabolic Panel:  Recent Labs Lab 09/05/15 1433  NA 138  K 3.7  CL 109  CO2 23  GLUCOSE 106*  BUN 10  CREATININE 0.54  CALCIUM 8.3*   Liver Function Tests: No results for input(s): AST, ALT, ALKPHOS, BILITOT, PROT, ALBUMIN in the last 168 hours. No results for input(s): LIPASE, AMYLASE in the last 168 hours. No results for input(s): AMMONIA in the last 168 hours. CBC: No results for input(s): WBC, NEUTROABS, HGB, HCT, MCV, PLT in the last 168 hours. Cardiac Enzymes: No results for input(s): CKTOTAL, CKMB, CKMBINDEX, TROPONINI in the last 168 hours. BNP: BNP (last 3 results)  Recent Labs  08/02/15 2124  BNP 25.5    ProBNP (last 3 results) No results for input(s): PROBNP in the last 8760 hours.  CBG: No results for input(s): GLUCAP in the last 168 hours.     SignedZannie Cove:  Rilynne Lonsway MD.  [facility] 09/10/2015, 10:45 AM

## 2015-09-19 ENCOUNTER — Telehealth: Payer: Self-pay | Admitting: Family

## 2015-09-19 NOTE — Telephone Encounter (Signed)
Pt request refill for oxyCODONE (OXYCONTIN) 20 mg 12 hr [facility] [place]

## 2015-09-19 NOTE — Telephone Encounter (Signed)
Tammy SoursGreg is out of the office this week pls advise...Raechel Chute/lmb

## 2015-09-19 NOTE — Telephone Encounter (Signed)
Very sorry, but this medication is no longer meant to be a chronic refillable mediation per Marcos EkeGreg Calone NP  Pt was rx codeine at last visit.  thanks

## 2015-09-19 NOTE — Telephone Encounter (Signed)
Left message on patient's voicemail to give us a call back.  

## 2015-09-20 ENCOUNTER — Telehealth: Payer: Self-pay

## 2015-09-20 DIAGNOSIS — K7460 Unspecified cirrhosis of liver: Secondary | ICD-10-CM

## 2015-09-20 DIAGNOSIS — I509 Heart failure, unspecified: Secondary | ICD-10-CM

## 2015-09-20 MED ORDER — FUROSEMIDE 40 MG PO TABS: 40.0000 mg | ORAL_TABLET | Freq: Two times a day (BID) | ORAL | Status: AC

## 2015-09-20 NOTE — Telephone Encounter (Signed)
Called pt no answer LMOM rx sent to [facility]../lmb 

## 2015-09-20 NOTE — Telephone Encounter (Signed)
furosemide (LASIX) 40 MG tablet     Patient is requesting a refill on this medication. Please follow up, Thank you.

## 2015-09-23 ENCOUNTER — Encounter (HOSPITAL_COMMUNITY): Payer: Self-pay | Admitting: Emergency Medicine

## 2015-09-23 ENCOUNTER — Inpatient Hospital Stay (HOSPITAL_COMMUNITY)
Admission: EM | Admit: 2015-09-23 | Discharge: 2015-10-09 | DRG: 432 | Disposition: A | Discharge status: Skilled Nursing Facility | Payer: BLUE CROSS/BLUE SHIELD | Attending: Internal Medicine | Admitting: Internal Medicine

## 2015-09-23 ENCOUNTER — Emergency Department (HOSPITAL_COMMUNITY): Payer: BLUE CROSS/BLUE SHIELD

## 2015-09-23 DIAGNOSIS — J45909 Unspecified asthma, uncomplicated: Secondary | ICD-10-CM | POA: Diagnosis present

## 2015-09-23 DIAGNOSIS — R110 Nausea: Secondary | ICD-10-CM

## 2015-09-23 DIAGNOSIS — J9611 Chronic respiratory failure with hypoxia: Secondary | ICD-10-CM | POA: Diagnosis not present

## 2015-09-23 DIAGNOSIS — D696 Thrombocytopenia, unspecified: Secondary | ICD-10-CM | POA: Diagnosis present

## 2015-09-23 DIAGNOSIS — Z9981 Dependence on supplemental oxygen: Secondary | ICD-10-CM

## 2015-09-23 DIAGNOSIS — Z7189 Other specified counseling: Secondary | ICD-10-CM | POA: Insufficient documentation

## 2015-09-23 DIAGNOSIS — Z79899 Other long term (current) drug therapy: Secondary | ICD-10-CM

## 2015-09-23 DIAGNOSIS — E860 Dehydration: Secondary | ICD-10-CM | POA: Diagnosis present

## 2015-09-23 DIAGNOSIS — R531 Weakness: Secondary | ICD-10-CM | POA: Diagnosis not present

## 2015-09-23 DIAGNOSIS — J9621 Acute and chronic respiratory failure with hypoxia: Secondary | ICD-10-CM

## 2015-09-23 DIAGNOSIS — Z85828 Personal history of other malignant neoplasm of skin: Secondary | ICD-10-CM | POA: Diagnosis not present

## 2015-09-23 DIAGNOSIS — J948 Other specified pleural conditions: Secondary | ICD-10-CM | POA: Diagnosis not present

## 2015-09-23 DIAGNOSIS — K7682 Hepatic encephalopathy: Secondary | ICD-10-CM | POA: Insufficient documentation

## 2015-09-23 DIAGNOSIS — K7290 Hepatic failure, unspecified without coma: Secondary | ICD-10-CM | POA: Diagnosis present

## 2015-09-23 DIAGNOSIS — A084 Viral intestinal infection, unspecified: Secondary | ICD-10-CM | POA: Diagnosis present

## 2015-09-23 DIAGNOSIS — K769 Liver disease, unspecified: Secondary | ICD-10-CM

## 2015-09-23 DIAGNOSIS — N390 Urinary tract infection, site not specified: Secondary | ICD-10-CM | POA: Diagnosis present

## 2015-09-23 DIAGNOSIS — R109 Unspecified abdominal pain: Secondary | ICD-10-CM

## 2015-09-23 DIAGNOSIS — R188 Other ascites: Secondary | ICD-10-CM

## 2015-09-23 DIAGNOSIS — I951 Orthostatic hypotension: Secondary | ICD-10-CM | POA: Insufficient documentation

## 2015-09-23 DIAGNOSIS — E876 Hypokalemia: Secondary | ICD-10-CM | POA: Diagnosis present

## 2015-09-23 DIAGNOSIS — Z66 Do not resuscitate: Secondary | ICD-10-CM | POA: Diagnosis present

## 2015-09-23 DIAGNOSIS — R0781 Pleurodynia: Secondary | ICD-10-CM

## 2015-09-23 DIAGNOSIS — R55 Syncope and collapse: Secondary | ICD-10-CM | POA: Diagnosis present

## 2015-09-23 DIAGNOSIS — I10 Essential (primary) hypertension: Secondary | ICD-10-CM | POA: Diagnosis present

## 2015-09-23 DIAGNOSIS — F411 Generalized anxiety disorder: Secondary | ICD-10-CM | POA: Insufficient documentation

## 2015-09-23 DIAGNOSIS — J918 Pleural effusion in other conditions classified elsewhere: Secondary | ICD-10-CM | POA: Diagnosis present

## 2015-09-23 DIAGNOSIS — R0600 Dyspnea, unspecified: Secondary | ICD-10-CM

## 2015-09-23 DIAGNOSIS — Z515 Encounter for palliative care: Secondary | ICD-10-CM | POA: Insufficient documentation

## 2015-09-23 DIAGNOSIS — K7460 Unspecified cirrhosis of liver: Principal | ICD-10-CM | POA: Diagnosis present

## 2015-09-23 DIAGNOSIS — R140 Abdominal distension (gaseous): Secondary | ICD-10-CM

## 2015-09-23 DIAGNOSIS — R0902 Hypoxemia: Secondary | ICD-10-CM

## 2015-09-23 DIAGNOSIS — E7800 Pure hypercholesterolemia, unspecified: Secondary | ICD-10-CM | POA: Diagnosis present

## 2015-09-23 LAB — CBC
HCT: 36.2 % — ABNORMAL LOW (ref 39.0–52.0)
Hemoglobin: 12.5 g/dL — ABNORMAL LOW (ref 13.0–17.0)
MCH: 36.8 pg — ABNORMAL HIGH (ref 26.0–34.0)
MCHC: 34.5 g/dL (ref 30.0–36.0)
MCV: 106.5 fL — AB (ref 78.0–100.0)
PLATELETS: 69 10*3/uL — AB (ref 150–400)
RBC: 3.4 MIL/uL — ABNORMAL LOW (ref 4.22–5.81)
RDW: 15.3 % (ref 11.5–15.5)
WBC: 4.9 10*3/uL (ref 4.0–10.5)

## 2015-09-23 LAB — PHOSPHORUS: Phosphorus: 3.3 mg/dL (ref 2.5–4.6)

## 2015-09-23 LAB — COMPREHENSIVE METABOLIC PANEL
ALK PHOS: 71 U/L (ref 38–126)
ALT: 32 U/L (ref 17–63)
ANION GAP: 6 (ref 5–15)
AST: 64 U/L — ABNORMAL HIGH (ref 15–41)
Albumin: 2.3 g/dL — ABNORMAL LOW (ref 3.5–5.0)
BILIRUBIN TOTAL: 2.6 mg/dL — AB (ref 0.3–1.2)
BUN: 13 mg/dL (ref 6–20)
CALCIUM: 7.9 mg/dL — AB (ref 8.9–10.3)
CO2: 24 mmol/L (ref 22–32)
Chloride: 108 mmol/L (ref 101–111)
Creatinine, Ser: 0.85 mg/dL (ref 0.61–1.24)
GFR calc non Af Amer: >60 mL/min (ref >60)
Glucose, Bld: 92 mg/dL (ref 65–99)
POTASSIUM: 3.2 mmol/L — AB (ref 3.5–5.1)
Sodium: 138 mmol/L (ref 135–145)
TOTAL PROTEIN: 6.2 g/dL — AB (ref 6.5–8.1)

## 2015-09-23 LAB — LIPASE, BLOOD: Lipase: 36 U/L (ref 11–51)

## 2015-09-23 LAB — POC OCCULT BLOOD, ED: FECAL OCCULT BLD: NEGATIVE

## 2015-09-23 LAB — I-STAT TROPONIN, ED: TROPONIN I, POC: 0 ng/mL (ref 0.00–0.08)

## 2015-09-23 LAB — URINALYSIS, ROUTINE W REFLEX MICROSCOPIC
Bilirubin Urine: NEGATIVE
Glucose, UA: NEGATIVE mg/dL
Hgb urine dipstick: NEGATIVE
KETONES UR: NEGATIVE mg/dL
LEUKOCYTES UA: NEGATIVE
NITRITE: NEGATIVE
PROTEIN: NEGATIVE mg/dL
Specific Gravity, Urine: 1.02 (ref 1.005–1.030)
pH: 5.5 (ref 5.0–8.0)

## 2015-09-23 LAB — MRSA PCR SCREENING: MRSA BY PCR: NEGATIVE

## 2015-09-23 LAB — MAGNESIUM: Magnesium: 1.8 mg/dL (ref 1.7–2.4)

## 2015-09-23 LAB — TSH: TSH: 2.008 u[IU]/mL (ref 0.350–4.500)

## 2015-09-23 LAB — TROPONIN I

## 2015-09-23 MED ORDER — DEXTROSE 5 % IV SOLN
1.0000 g | Freq: Three times a day (TID) | INTRAVENOUS | Status: DC
  Administered 2015-09-23 – 2015-09-25 (×5): 1 g via INTRAVENOUS
  Filled 2015-09-23 (×6): qty 1

## 2015-09-23 MED ORDER — OXYCODONE HCL 5 MG PO TABS
5.0000 mg | ORAL_TABLET | Freq: Four times a day (QID) | ORAL | Status: DC | PRN
  Administered 2015-09-23 – 2015-09-26 (×8): 5 mg via ORAL
  Filled 2015-09-23 (×8): qty 1

## 2015-09-23 MED ORDER — FENTANYL CITRATE (PF) 100 MCG/2ML IJ SOLN
100.0000 ug | Freq: Once | INTRAMUSCULAR | Status: AC
  Administered 2015-09-23: 100 ug via INTRAVENOUS
  Filled 2015-09-23: qty 2

## 2015-09-23 MED ORDER — FLUTICASONE FUROATE-VILANTEROL 100-25 MCG/INH IN AEPB
1.0000 | INHALATION_SPRAY | Freq: Every day | RESPIRATORY_TRACT | Status: DC
  Administered 2015-09-24 – 2015-10-09 (×15): 1 via RESPIRATORY_TRACT
  Filled 2015-09-23 (×2): qty 28

## 2015-09-23 MED ORDER — LEVALBUTEROL HCL 0.63 MG/3ML IN NEBU
0.6300 mg | INHALATION_SOLUTION | Freq: Four times a day (QID) | RESPIRATORY_TRACT | Status: DC | PRN
  Administered 2015-10-01: 0.63 mg via RESPIRATORY_TRACT
  Filled 2015-09-23: qty 3

## 2015-09-23 MED ORDER — POTASSIUM CHLORIDE CRYS ER 20 MEQ PO TBCR
40.0000 meq | EXTENDED_RELEASE_TABLET | Freq: Once | ORAL | Status: AC
  Administered 2015-09-23: 40 meq via ORAL
  Filled 2015-09-23: qty 2

## 2015-09-23 MED ORDER — METRONIDAZOLE IN NACL 5-0.79 MG/ML-% IV SOLN
500.0000 mg | Freq: Three times a day (TID) | INTRAVENOUS | Status: DC
  Administered 2015-09-24 – 2015-09-25 (×5): 500 mg via INTRAVENOUS
  Filled 2015-09-23 (×5): qty 100

## 2015-09-23 MED ORDER — ALBUTEROL SULFATE (2.5 MG/3ML) 0.083% IN NEBU: 2.5000 mg | INHALATION_SOLUTION | RESPIRATORY_TRACT | Status: DC | PRN

## 2015-09-23 MED ORDER — SODIUM CHLORIDE 0.9% FLUSH
3.0000 mL | Freq: Two times a day (BID) | INTRAVENOUS | Status: DC
  Administered 2015-09-24 – 2015-10-09 (×25): 3 mL via INTRAVENOUS

## 2015-09-23 MED ORDER — ALPRAZOLAM 0.5 MG PO TABS
0.5000 mg | ORAL_TABLET | Freq: Two times a day (BID) | ORAL | Status: DC | PRN
  Administered 2015-09-23 – 2015-10-03 (×10): 0.5 mg via ORAL
  Filled 2015-09-23 (×10): qty 1

## 2015-09-23 MED ORDER — PANTOPRAZOLE SODIUM 40 MG PO TBEC
40.0000 mg | DELAYED_RELEASE_TABLET | Freq: Every day | ORAL | Status: DC
  Administered 2015-09-24 – 2015-10-09 (×16): 40 mg via ORAL
  Filled 2015-09-23 (×16): qty 1

## 2015-09-23 MED ORDER — SODIUM CHLORIDE 0.9 % IV BOLUS (SEPSIS)
1000.0000 mL | Freq: Once | INTRAVENOUS | Status: AC
  Administered 2015-09-23: 1000 mL via INTRAVENOUS

## 2015-09-23 MED ORDER — ONDANSETRON HCL 4 MG PO TABS
4.0000 mg | ORAL_TABLET | Freq: Four times a day (QID) | ORAL | Status: DC | PRN
  Administered 2015-09-30 – 2015-10-03 (×2): 4 mg via ORAL
  Filled 2015-09-23 (×2): qty 1

## 2015-09-23 MED ORDER — SODIUM CHLORIDE 0.9 % IV SOLN
INTRAVENOUS | Status: AC
  Administered 2015-09-23: 18:00:00 via INTRAVENOUS
  Administered 2015-09-24: 1000 mL via INTRAVENOUS

## 2015-09-23 MED ORDER — LEVOFLOXACIN IN D5W 750 MG/150ML IV SOLN
750.0000 mg | INTRAVENOUS | Status: DC
  Administered 2015-09-23: 750 mg via INTRAVENOUS
  Filled 2015-09-23: qty 150

## 2015-09-23 MED ORDER — ALBUTEROL SULFATE (2.5 MG/3ML) 0.083% IN NEBU: 2.5000 mg | INHALATION_SOLUTION | Freq: Four times a day (QID) | RESPIRATORY_TRACT | Status: DC | PRN

## 2015-09-23 MED ORDER — ONDANSETRON HCL 4 MG/2ML IJ SOLN
4.0000 mg | Freq: Four times a day (QID) | INTRAMUSCULAR | Status: DC | PRN
  Administered 2015-10-08: 4 mg via INTRAVENOUS
  Filled 2015-09-23: qty 2

## 2015-09-23 MED ORDER — SPIRONOLACTONE 25 MG PO TABS
25.0000 mg | ORAL_TABLET | Freq: Two times a day (BID) | ORAL | Status: DC
  Administered 2015-09-24 – 2015-10-09 (×30): 25 mg via ORAL
  Filled 2015-09-23 (×31): qty 1

## 2015-09-23 NOTE — Progress Notes (Signed)
Pharmacy Antibiotic Note  Roy Case is a 50 y.o. male admitted on 09/23/2015 with suspected SBP.  Pharmacy has been consulted for levofloxacin dosing. AF, wbc wnl, CrCl>100.  PM pt complained of lip tingling and itching at IV site with levofloxacin infusing Spoke to NP will change to flagyl 500mg  q8 and aztreonam 1mg  q8   Plan: Levofloxacin 750mg  IV q24h Monitor clinical progress, c/s, renal function, abx plan/LOT  Height: 5\' 9"  (175.3 cm) Weight: 172 lb (78.019 kg) IBW/kg (Calculated) : 70.7  Temp (24hrs), Avg:98.1 F (36.7 C), Min:98 F (36.7 C), Max:98.2 F (36.8 C)   Recent Labs Lab 09/23/15 1435 09/23/15 1551  WBC  --  4.9  CREATININE 0.85  --     Estimated Creatinine Clearance: 105.1 mL/min (by C-G formula based on Cr of 0.85).    Allergies  Allergen Reactions  . Azithromycin Other (See Comments)    Blisters on mouth  . Cefdinir Other (See Comments)    Blisters in mouth   . Penicillins Anaphylaxis and Other (See Comments)    Has patient had a PCN reaction causing immediate rash, facial/tongue/throat swelling, SOB or lightheadedness with hypotension: YES Has patient had a PCN reaction causing severe rash involving mucus membranes or skin necrosis:NO Has patient had a PCN reaction occurring within the last 10 years: NO If all of the above answers are "NO", then may proceed with Cephalosporin use.  . Acetaminophen Other (See Comments)    Reduced liver function   . Levofloxacin Itching    Itching around iv site, tingling of mouth  . Doxycycline Rash    Antimicrobials this admission: 7/9 levo >>   Dose adjustments this admission:   Microbiology results: 7/9 GI panel: 7/9 cdiff:    Babs BertinHaley Baird, PharmD, [facility]BCPS Clinical Pharmacist Pager [phone] 09/23/2015 10:33 PM

## 2015-09-23 NOTE — ED Provider Notes (Addendum)
CSN: [id number]     Arrival date & time 09/23/15  1413 History   First MD Initiated Contact with Patient 09/23/15 1502     Chief Complaint  Patient presents with  . Loss of Consciousness  . Abdominal Pain     (Consider location/radiation/quality/duration/timing/severity/associated sxs/prior Treatment) HPI Complains of vomiting and diarrhea onset 12 midnight today. He's had 10 episodes of diarrhea he is uncertain if he saw blood and 5 episodes of vomiting. He denies nausea at present. He was brought by EMS. Received antiemetic in the field. He denies any shortness of breath. He does admit to diffuse abdominal pain for the past 3 weeks. Nothing makes pain better or worse. Pain is moderate at present and constant.. Denies any shortness of breath. Other associated symptoms include temperature 100.5 yesterday. Patient had syncopal event today when he stood up. Preceded by lightheadedness. He denies any headache denies chest pain denies shortness of breath. Past Medical History  Diagnosis Date  . Hypertension   . Allergic rhinitis   . Asthma   . High cholesterol   . Cirrhosis ([place])   . 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 History of home oxygen therapy     4 liters all the time  Oxygen 5 L Past Surgical History  Procedure Laterality Date  . Cholecystectomy  may 2014  . Skin cancer excision Left     arm  . Carpal tunnel release Right   . Cardiac catheterization N/A 05/31/2015    Procedure: Right Heart Cath;  Surgeon: Larey Dresser, MD;  Location: [facility];  Service: Cardiovascular;  Laterality: N/A;  . Bone marrow biopsy  05-30-15   Family History  Problem Relation Age of Onset  . Lung cancer Mother   . Colon cancer Neg Hx   . Esophageal cancer Neg Hx   . Pancreatic cancer Neg Hx   . Kidney disease Neg Hx   . Liver disease Neg Hx    Social History  Substance Use Topics  . Smoking status: Never Smoker   . Smokeless tobacco: Never Used  . Alcohol Use: No     Review of Systems  Constitutional: Positive for fever.  HENT: Negative.   Respiratory: Negative.   Cardiovascular: Positive for leg swelling.       Chronic leg edema  Gastrointestinal: Positive for nausea, vomiting, abdominal pain and diarrhea.  Musculoskeletal: Negative.   Skin: Negative.   Neurological: Negative.   Psychiatric/Behavioral: Negative.   All other systems reviewed and are negative.     Allergies  Penicillins; Acetaminophen; Azithromycin; Cefdinir; and Doxycycline  Home Medications   Prior to Admission medications   Medication Sig Start Date End Date Taking? Authorizing Provider  albuterol (PROVENTIL HFA;VENTOLIN HFA) 108 (90 Base) MCG/ACT inhaler Inhale 2 puffs into the lungs every 6 (six) hours as needed for wheezing or shortness of breath. 06/06/15   Juanito Doom, MD  albuterol (PROVENTIL) (2.5 MG/3ML) 0.083% nebulizer solution Take 3 mLs (2.5 mg total) by nebulization every 4 (four) hours as needed for wheezing or shortness of breath. 05/03/15   Golden Circle, FNP  ALPRAZolam Duanne Moron) 0.5 MG tablet Take 0.5 mg by mouth 2 (two) times daily as needed for anxiety.     Historical Provider, MD  codeine 30 MG tablet Take 0.5-1 tablets (15-30 mg total) by mouth every 6 (six) hours as needed. 08/27/15   Golden Circle, FNP  fluticasone furoate-vilanterol (BREO ELLIPTA) 100-25 MCG/INH AEPB Inhale 1 puff into the lungs daily. Reported  on 05/16/2015 05/16/15   Marijean Heath, NP  furosemide (LASIX) 40 MG tablet Take 1 tablet (40 mg total) by mouth 2 (two) times daily. 09/20/15   Golden Circle, FNP  ibuprofen (ADVIL,MOTRIN) 800 MG tablet TK 1 T PO TID PRN PAIN 07/27/15   Historical Provider, MD  OXYGEN Inhale 4.5-5 L into the lungs continuous.     Historical Provider, MD  pantoprazole (PROTONIX) 40 MG tablet Take 40 mg by mouth daily.    Historical Provider, MD  polyethylene glycol (MIRALAX / GLYCOLAX) packet Take 17 g by mouth daily as needed for mild  constipation. 08/21/15   Domenic Polite, MD  potassium chloride SA (K-DUR,KLOR-CON) 20 MEQ tablet Take 20 mEq by mouth daily.    Historical Provider, MD  spironolactone (ALDACTONE) 25 MG tablet Take 25 mg by mouth 2 (two) times daily.    Historical Provider, MD   BP 124/69 mmHg  Pulse 90  Temp(Src) 98 F (36.7 C) (Oral)  Resp 20  Ht '5\' 9"'  (1.753 m)  Wt 172 lb (78.019 kg)  BMI 25.39 kg/m2  SpO2 96% Physical Exam  Constitutional: He is oriented to person, place, and time. No distress.  Chronically ill-appearing alert Glasgow Coma Score 15  HENT:  Head: Normocephalic and atraumatic.  Mucous membranes dry  Eyes: Conjunctivae are normal. Pupils are equal, round, and reactive to light.  Neck: Neck supple. No tracheal deviation present. No thyromegaly present.  Cardiovascular: Normal rate and regular rhythm.   No murmur heard. Pulmonary/Chest: Effort normal and breath sounds normal.  Pleur-evac drain at right chest mid axillary line  Abdominal: Soft. Bowel sounds are normal. He exhibits distension.  Mild diffuse tenderness  Genitourinary: Penis normal. Guaiac negative stool.  Rectal normal tone brown stool Hemoccult negative  Musculoskeletal: Normal range of motion. He exhibits edema. He exhibits no tenderness.  1+ pretibial pitting edema bilaterally  Neurological: He is alert and oriented to person, place, and time. No cranial nerve deficit. Coordination normal.  Skin: Skin is warm and dry. No rash noted.  Psychiatric: He has a normal mood and affect.  Nursing note and vitals reviewed.   ED Course  Procedures (including critical care time) Labs Review Labs Reviewed  LIPASE, BLOOD  COMPREHENSIVE METABOLIC PANEL  URINALYSIS, ROUTINE W REFLEX MICROSCOPIC (NOT AT [facility])  I-STAT TROPOININ, ED    Imaging Review No results found. I have personally reviewed and evaluated these images and lab results as part of my medical decision-making.   EKG Interpretation   Date/Time:  Sunday  September 23 2015 14:17:05 EDT Ventricular Rate:  96 PR Interval:    QRS Duration: 95 QT Interval:  389 QTC Calculation: 492 R Axis:   69 Text Interpretation:  Sinus rhythm Borderline prolonged QT interval No  significant change since last tracing Confirmed by Blanka Rockholt  MD, Mersadez Linden  [phone]) on 09/23/2015 3:04:02 PM    After treatment with 1 L normal saline patient is lightheaded when he stands up. Results for orders placed or performed during the hospital encounter of 09/23/15  Lipase, blood  Result Value Ref Range   Lipase 36 11 - 51 U/L  Comprehensive metabolic panel  Result Value Ref Range   Sodium 138 135 - 145 mmol/L   Potassium 3.2 (L) 3.5 - 5.1 mmol/L   Chloride 108 101 - 111 mmol/L   CO2 24 22 - 32 mmol/L   Glucose, Bld 92 65 - 99 mg/dL   BUN 13 6 - 20 mg/dL   Creatinine, Ser  0.85 0.61 - 1.24 mg/dL   Calcium 7.9 (L) 8.9 - 10.3 mg/dL   Total Protein 6.2 (L) 6.5 - 8.1 g/dL   Albumin 2.3 (L) 3.5 - 5.0 g/dL   AST 64 (H) 15 - 41 U/L   ALT 32 17 - 63 U/L   Alkaline Phosphatase 71 38 - 126 U/L   Total Bilirubin 2.6 (H) 0.3 - 1.2 mg/dL   GFR calc non Af Amer >60 >60 mL/min   GFR calc Af Amer >60 >60 mL/min   Anion gap 6 5 - 15  Urinalysis, Routine w reflex microscopic  Result Value Ref Range   Color, Urine AMBER (A) YELLOW   APPearance CLOUDY (A) CLEAR   Specific Gravity, Urine 1.020 1.005 - 1.030   pH 5.5 5.0 - 8.0   Glucose, UA NEGATIVE NEGATIVE mg/dL   Hgb urine dipstick NEGATIVE NEGATIVE   Bilirubin Urine NEGATIVE NEGATIVE   Ketones, ur NEGATIVE NEGATIVE mg/dL   Protein, ur NEGATIVE NEGATIVE mg/dL   Nitrite NEGATIVE NEGATIVE   Leukocytes, UA NEGATIVE NEGATIVE  CBC  Result Value Ref Range   WBC 4.9 4.0 - 10.5 K/uL   RBC 3.40 (L) 4.22 - 5.81 MIL/uL   Hemoglobin 12.5 (L) 13.0 - 17.0 g/dL   HCT 36.2 (L) 39.0 - 52.0 %   MCV 106.5 (H) 78.0 - 100.0 fL   MCH 36.8 (H) 26.0 - 34.0 pg   MCHC 34.5 30.0 - 36.0 g/dL   RDW 15.3 11.5 - 15.5 %   Platelets 69 (L) 150 - 400 K/uL   I-Stat Troponin, ED (not at [facility])  Result Value Ref Range   Troponin i, poc 0.00 0.00 - 0.08 ng/mL   Comment 3          POC occult blood, ED Provider will collect  Result Value Ref Range   Fecal Occult Bld NEGATIVE NEGATIVE   Dg Abd Acute W/chest  09/23/2015  CLINICAL DATA:  Vomiting and diarrhea EXAM: DG ABDOMEN ACUTE W/ 1V CHEST COMPARISON:  Chest radiograph August 22, 2015; CT abdomen April 06, 2015 FINDINGS: PA chest: There is no edema or consolidation. PleurX catheter on the right is again noted. Heart size and pulmonary vascularity are normal. No adenopathy. Supine and upright abdomen: There is moderate stool throughout the colon. There is no bowel dilatation or air-fluid level suggesting obstruction. No free air evident. There are multiple surgical clips in the right upper abdomen. IMPRESSION: Bowel gas pattern unremarkable. No demonstrable obstruction or free air. No parenchymal lung edema or consolidation. Electronically Signed   By: Lowella Grip III M.D.   On: 09/23/2015 15:51   X-rays viewed by me MDM  Syncope felt secondary to dehydration and third spacing. Dr Allyson Sabal consulted plan admit step down unit intravenous hydration. Oxygen. Potassium supplementation in the ED.Had lengthy discussion with patient he is DO NOT RESUSCITATE/DO NOT INTUBATE CODE STATUS Final diagnoses:  None  Diagnosis #1 syncope #2 nausea vomiting diarrhea #3 hypokalemia #4 hypocalcemia      Orlie Dakin, MD 09/23/15 [place], MD 09/23/15 1736

## 2015-09-23 NOTE — ED Notes (Signed)
10 minutes after starting Levaquin pt started having redness and itching around IV site and a "tingling" around mouth. IV Levaquin stopped. Admitting paged. No hives or sob. Lung sounds clear.

## 2015-09-23 NOTE — Progress Notes (Signed)
Pharmacy Antibiotic Note  Roy Case is a 50 y.o. male admitted on 09/23/2015 with suspected SBP.  Pharmacy has been consulted for levofloxacin dosing. AF, wbc wnl, CrCl>100.  Plan: Levofloxacin 750mg  IV q24h Monitor clinical progress, c/s, renal function, abx plan/LOT  Height: 5\' 9"  (175.3 cm) Weight: 172 lb (78.019 kg) IBW/kg (Calculated) : 70.7  Temp (24hrs), Avg:98.1 F (36.7 C), Min:98 F (36.7 C), Max:98.2 F (36.8 C)   Recent Labs Lab 09/23/15 1435 09/23/15 1551  WBC  --  4.9  CREATININE 0.85  --     Estimated Creatinine Clearance: 105.1 mL/min (by C-G formula based on Cr of 0.85).    Allergies  Allergen Reactions  . Azithromycin Other (See Comments)    Blisters on mouth  . Cefdinir Other (See Comments)    Blisters in mouth   . Penicillins Anaphylaxis and Other (See Comments)    Has patient had a PCN reaction causing immediate rash, facial/tongue/throat swelling, SOB or lightheadedness with hypotension: YES Has patient had a PCN reaction causing severe rash involving mucus membranes or skin necrosis:NO Has patient had a PCN reaction occurring within the last 10 years: NO If all of the above answers are "NO", then may proceed with Cephalosporin use.  . Acetaminophen Other (See Comments)    Reduced liver function   . Doxycycline Rash    Antimicrobials this admission: 7/9 levo >>   Dose adjustments this admission:   Microbiology results: 7/9 GI panel: 7/9 cdiff:    Babs BertinHaley Lucero Ide, PharmD, [place] [facility] FBCPS Clinical Pharmacist Pager [phone] 09/23/2015 5:49 PM

## 2015-09-23 NOTE — ED Notes (Signed)
Pt present to the ED with generalized abd pain with swelling pt has history of ascites and had 600CC drained in the last 6 weeks per pt. PT reports n/v/d since Wednesday with 4 episodes per day. PT states he walked to restroom this am and back to couch pt states that's the last thing he remembers he woke up on living room floor. Pt lives at home alone.  PT has has history of chronic respiratory failure requires 5L [place] of Oxygen. PT also has Pleurx catheter in right side of chest with dressing clean and intact.

## 2015-09-23 NOTE — H&P (Signed)
[facility] History and Physical  Roy Case [id number] DOB: 04-18-1965 DOA: 09/23/2015  Referring physician: ER  PCP: Mauricio Po, FNP   Chief Complaint: Diarrhea and syncope   HPI:  50 y.o. male with medical history significant for idiopathic hemachromatosis managed with phlebotomies, liver cirrhosis, chronic respiratory failure with supplemental oxygen requirement of 4-5 L/m, and pleural effusions with Pleurx catheter in the right chest presented to the ED with syncope and collapse related to dehydration. Patient has been having vomiting and diarrhea since Wednesday. He continued to have diarrhea up until 2 AM this morning. Got up to go to the bathroom and passed out. He also noted to have a low-grade fever off 100.5 yesterday. Denies any hematemesis or hematochezia. Denies any chest pain or shortness of breath.      Review of Systems: negative for the following  Constitutional: Denies fever, chills, diaphoresis, appetite change and fatigue.  HEENT: Denies photophobia, eye pain, redness, hearing loss, ear pain, congestion, sore throat, rhinorrhea, sneezing, mouth sores, trouble swallowing, neck pain, neck stiffness and tinnitus.  Respiratory: Denies SOB, DOE, cough, chest tightness, and wheezing.  Cardiovascular: Denies chest pain, palpitations and leg swelling.  Gastrointestinal: Positive for nausea, vomiting, abdominal pain and diarrhea Genitourinary: Denies dysuria, urgency, frequency, hematuria, flank pain and difficulty urinating.  Musculoskeletal: Denies myalgias, back pain, joint swelling, arthralgias and gait problem.  Skin: Denies pallor, rash and wound.  Neurological: Denies dizziness, seizures, syncope, weakness, light-headedness, numbness and headaches.  Hematological: Denies adenopathy. Easy bruising, personal or family bleeding history  Psychiatric/Behavioral: Denies suicidal ideation, mood changes, confusion, nervousness, sleep disturbance and agitation        Past Medical History  Diagnosis Date  . Hypertension   . Allergic rhinitis   . Asthma   . High cholesterol   . Cirrhosis ([place])   . Idiopathic hemochromatosis ([place]) 06/04/2015  . History of home oxygen therapy     4 liters all the time     Past Surgical History  Procedure Laterality Date  . Cholecystectomy  may 2014  . Skin cancer excision Left     arm  . Carpal tunnel release Right   . Cardiac catheterization N/A 05/31/2015    Procedure: Right Heart Cath;  Surgeon: Larey Dresser, MD;  Location: [facility];  Service: Cardiovascular;  Laterality: N/A;  . Bone marrow biopsy  05-30-15      Social History:  reports that he has never smoked. He has never used smokeless tobacco. He reports that he does not drink alcohol or use illicit drugs.    Allergies  Allergen Reactions  . Azithromycin Other (See Comments)    Blisters on mouth  . Cefdinir Other (See Comments)    Blisters in mouth   . Penicillins Anaphylaxis and Other (See Comments)    Has patient had a PCN reaction causing immediate rash, facial/tongue/throat swelling, SOB or lightheadedness with hypotension: YES Has patient had a PCN reaction causing severe rash involving mucus membranes or skin necrosis:NO Has patient had a PCN reaction occurring within the last 10 years: NO If all of the above answers are "NO", then may proceed with Cephalosporin use.  . Acetaminophen Other (See Comments)    Reduced liver function   . Doxycycline Rash    Family History  Problem Relation Age of Onset  . Lung cancer Mother   . Colon cancer Neg Hx   . Esophageal cancer Neg Hx   . Pancreatic cancer Neg Hx   . Kidney disease  Neg Hx   . Liver disease Neg Hx         Prior to Admission medications   Medication Sig Start Date End Date Taking? Authorizing Provider  albuterol (PROVENTIL HFA;VENTOLIN HFA) 108 (90 Base) MCG/ACT inhaler Inhale 2 puffs into the lungs every 6 (six) hours as needed for wheezing or  shortness of breath. 06/06/15  Yes Juanito Doom, MD  albuterol (PROVENTIL) (2.5 MG/3ML) 0.083% nebulizer solution Take 3 mLs (2.5 mg total) by nebulization every 4 (four) hours as needed for wheezing or shortness of breath. 05/03/15  Yes Golden Circle, FNP  ALPRAZolam Duanne Moron) 0.5 MG tablet Take 0.5 mg by mouth 2 (two) times daily as needed for anxiety.    Yes Historical Provider, MD  fluticasone furoate-vilanterol (BREO ELLIPTA) 100-25 MCG/INH AEPB Inhale 1 puff into the lungs daily. Reported on 05/16/2015 05/16/15  Yes Marijean Heath, NP  furosemide (LASIX) 40 MG tablet Take 1 tablet (40 mg total) by mouth 2 (two) times daily. 09/20/15  Yes Golden Circle, FNP  ibuprofen (ADVIL,MOTRIN) 800 MG tablet TK 1 T PO TID PRN PAIN 07/27/15  Yes Historical Provider, MD  OXYGEN Inhale 4.5-5 L into the lungs continuous.    Yes Historical Provider, MD  pantoprazole (PROTONIX) 40 MG tablet Take 40 mg by mouth daily.   Yes Historical Provider, MD  polyethylene glycol (MIRALAX / GLYCOLAX) packet Take 17 g by mouth daily as needed for mild constipation. 08/21/15  Yes Domenic Polite, MD  potassium chloride SA (K-DUR,KLOR-CON) 20 MEQ tablet Take 20 mEq by mouth 2 (two) times daily.    Yes Historical Provider, MD  spironolactone (ALDACTONE) 25 MG tablet Take 25 mg by mouth 2 (two) times daily.   Yes Historical Provider, MD  codeine 30 MG tablet Take 0.5-1 tablets (15-30 mg total) by mouth every 6 (six) hours as needed. 08/27/15   Golden Circle, FNP     Physical Exam: Filed Vitals:   09/23/15 1430 09/23/15 1500 09/23/15 1514 09/23/15 1728  BP: 124/69 123/74  116/67  Pulse: 90 86  78  Temp:   98.2 F (36.8 C)   TempSrc:   Rectal   Resp: _0 Height:      Weight:      SpO2: 96% 93%  90%      Constitutional: NAD, calm, comfortable Filed Vitals:   09/23/15 1430 09/23/15 1500 09/23/15 1514 09/23/15 1728  BP: 124/69 123/74  116/67  Pulse: 90 86  78  Temp:   98.2 F (36.8 C)   TempSrc:    Rectal   Resp: _1 Height:      Weight:      SpO2: 96% 93%  90%   Eyes: PERRL, lids and conjunctivae normal ENMT: Mucous membranes are moist. Posterior pharynx clear of any exudate or lesions.Normal dentition.  Neck: normal, supple, no masses, no thyromegaly Pulmonary/Chest: Effort normal and breath sounds normal.  Pleur-evac drain at right chest mid axillary line  Abdominal: Soft. Bowel sounds are normal. He exhibits distension.  Mild diffuse tenderness  Genitourinary: Penis normal. Guaiac negative stool.  Rectal normal tone brown stool Hemoccult negative  Musculoskeletal: Normal range of motion. He exhibits edema. He exhibits no tenderness.  1+ pretibial pitting edema bilaterally  Skin: no rashes, lesions, ulcers. No induration Neurologic: CN 2-12 grossly intact. Sensation intact, DTR normal. Strength 5/5 in all 4.  Psychiatric: Normal judgment and insight. Alert and oriented x 3. Normal mood.  Labs on Admission: I have personally reviewed following labs and imaging studies  CBC:  Recent Labs Lab 09/23/15 1551  WBC 4.9  HGB 12.5*  HCT 36.2*  MCV 106.5*  PLT 69*    Basic Metabolic Panel:  Recent Labs Lab 09/23/15 1435  NA 138  K 3.2*  CL 108  CO2 24  GLUCOSE 92  BUN 13  CREATININE 0.85  CALCIUM 7.9*    GFR: Estimated Creatinine Clearance: 105.1 mL/min (by C-G formula based on Cr of 0.85).  Liver Function Tests:  Recent Labs Lab 09/23/15 1435  AST 64*  ALT 32  ALKPHOS 71  BILITOT 2.6*  PROT 6.2*  ALBUMIN 2.3*    Recent Labs Lab 09/23/15 1435  LIPASE 36   No results for input(s): AMMONIA in the last 168 hours.  Coagulation Profile: No results for input(s): INR, PROTIME in the last 168 hours. No results for input(s): DDIMER in the last 72 hours.  Cardiac Enzymes: No results for input(s): CKTOTAL, CKMB, CKMBINDEX, TROPONINI in the last 168 hours.  BNP (last 3 results) No results for input(s): PROBNP in the last 8760  hours.  HbA1C: No results for input(s): HGBA1C in the last 72 hours. No results found for: HGBA1C   CBG: No results for input(s): GLUCAP in the last 168 hours.  Lipid Profile: No results for input(s): CHOL, HDL, LDLCALC, TRIG, CHOLHDL, LDLDIRECT in the last 72 hours.  Thyroid Function Tests: No results for input(s): TSH, T4TOTAL, FREET4, T3FREE, THYROIDAB in the last 72 hours.  Anemia Panel: No results for input(s): VITAMINB12, FOLATE, FERRITIN, TIBC, IRON, RETICCTPCT in the last 72 hours.  Urine analysis:    Component Value Date/Time   COLORURINE AMBER* 09/23/2015 1634   APPEARANCEUR CLOUDY* 09/23/2015 1634   LABSPEC 1.020 09/23/2015 1634   PHURINE 5.5 09/23/2015 1634   GLUCOSEU NEGATIVE 09/23/2015 1634   HGBUR NEGATIVE 09/23/2015 1634   BILIRUBINUR NEGATIVE 09/23/2015 1634   KETONESUR NEGATIVE 09/23/2015 1634   PROTEINUR NEGATIVE 09/23/2015 1634   NITRITE NEGATIVE 09/23/2015 1634   LEUKOCYTESUR NEGATIVE 09/23/2015 1634    Sepsis Labs: _0 (procalcitonin:4,lacticidven:4) )No results found for this or any previous visit (from the past 240 hour(s)).       Radiological Exams on Admission: Dg Abd Acute W/chest  09/23/2015  CLINICAL DATA:  Vomiting and diarrhea EXAM: DG ABDOMEN ACUTE W/ 1V CHEST COMPARISON:  Chest radiograph August 22, 2015; CT abdomen April 06, 2015 FINDINGS: PA chest: There is no edema or consolidation. PleurX catheter on the right is again noted. Heart size and pulmonary vascularity are normal. No adenopathy. Supine and upright abdomen: There is moderate stool throughout the colon. There is no bowel dilatation or air-fluid level suggesting obstruction. No free air evident. There are multiple surgical clips in the right upper abdomen. IMPRESSION: Bowel gas pattern unremarkable. No demonstrable obstruction or free air. No parenchymal lung edema or consolidation. Electronically Signed   By: Lowella Grip III M.D.   On: 09/23/2015 15:51   Dg Abd  Acute W/chest  09/23/2015  CLINICAL DATA:  Vomiting and diarrhea EXAM: DG ABDOMEN ACUTE W/ 1V CHEST COMPARISON:  Chest radiograph August 22, 2015; CT abdomen April 06, 2015 FINDINGS: PA chest: There is no edema or consolidation. PleurX catheter on the right is again noted. Heart size and pulmonary vascularity are normal. No adenopathy. Supine and upright abdomen: There is moderate stool throughout the colon. There is no bowel dilatation or air-fluid level suggesting obstruction. No free air evident. There are multiple surgical clips  in the right upper abdomen. IMPRESSION: Bowel gas pattern unremarkable. No demonstrable obstruction or free air. No parenchymal lung edema or consolidation. Electronically Signed   By: Lowella Grip III M.D.   On: 09/23/2015 15:51      EKG: Independently reviewed.    Assessment/Plan Active Problems:   Syncope and collapse likely secondary to diarrhea and dehydration Will hold diuretics and start patient on IV fluids Doubt sepsis however given his fever we will admit to step down, blood pressure soft Patient also has chronic respiratory failure with high oxygen requirements Maintain on cardiac monitor, cycle cardiac enzymes, 2-D echo to rule out wall motion abnormalities  Diarrhea-will send off stool studies, GI pathogen panel IV fluids  Fever-analyze fluid from Pleurx catheter, will also order ultrasound guided paracentesis to rule out SBP Empirically start patient on levofloxacin   Chronic respiratory failure with hypoxia-Pleurx catheter placed 9 weeks ago in [place] - Stable on admission; saturating well on his usual 4-5 Lpm  - Etiology is unknown at this time, followed by pulmonology in outpatient setting and a L->R shunt (intracardiac or pulmonary) suspected, Per pulm notes, there is plan for TEE to look for intracardiac shunt - also has recurrent effusions, 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 Hepatic hydrothorax - Has Pleurx catheter in right chest, placed 9-10  wks ago per pt; appears to be in appropriate position on imaging  - Right-sided effusion small and stable relative to recent CT; left-sided effusion is small  - Saturating well on his usual FiO2 /5L Patient has home health   Thrombocytopenia  - Platelet count 50-80K  - Stable, secondary to liver cirrhosis and hemachromatosis  - Hold pharmacologic VTE ppx            DVT prophylaxis: SCDs     Code Status Orders Full code         Start     Ordered     Family Communication: discussed with patient' By the bedside   Disposition Plan:  Anticipate discharge in 2-3 days pending further workup and clinical progress  Consults called: None  Admission status: Inpatient  Total time spent 55 minutes.Greater than 50% of this time was spent in counseling, explanation of diagnosis, planning of further management, and coordination of care  [facility] MD [facility] Pager [phone]  If 7PM-7AM, please contact night-coverage [email or web address] Password [facility]  09/23/2015, 6:23 PM

## 2015-09-23 NOTE — ED Notes (Signed)
Pt transported to xray 

## 2015-09-23 NOTE — ED Notes (Signed)
Report attempted.  Nurse to call back.

## 2015-09-24 ENCOUNTER — Inpatient Hospital Stay (HOSPITAL_COMMUNITY): Payer: BLUE CROSS/BLUE SHIELD

## 2015-09-24 ENCOUNTER — Encounter (HOSPITAL_COMMUNITY): Payer: Self-pay | Admitting: *Deleted

## 2015-09-24 LAB — CBC
HCT: 34.1 % — ABNORMAL LOW (ref 39.0–52.0)
HCT: 36.5 % — ABNORMAL LOW (ref 39.0–52.0)
HEMOGLOBIN: 11.6 g/dL — AB (ref 13.0–17.0)
Hemoglobin: 12.6 g/dL — ABNORMAL LOW (ref 13.0–17.0)
MCH: 36.4 pg — ABNORMAL HIGH (ref 26.0–34.0)
MCH: 37 pg — AB (ref 26.0–34.0)
MCHC: 34 g/dL (ref 30.0–36.0)
MCHC: 34.5 g/dL (ref 30.0–36.0)
MCV: 106.9 fL — AB (ref 78.0–100.0)
MCV: 107 fL — AB (ref 78.0–100.0)
PLATELETS: 58 10*3/uL — AB (ref 150–400)
PLATELETS: 62 10*3/uL — AB (ref 150–400)
RBC: 3.19 MIL/uL — AB (ref 4.22–5.81)
RBC: 3.41 MIL/uL — ABNORMAL LOW (ref 4.22–5.81)
RDW: 15.1 % (ref 11.5–15.5)
RDW: 15.1 % (ref 11.5–15.5)
WBC: 3.2 10*3/uL — ABNORMAL LOW (ref 4.0–10.5)
WBC: 4 10*3/uL (ref 4.0–10.5)

## 2015-09-24 LAB — COMPREHENSIVE METABOLIC PANEL
ALBUMIN: 1.9 g/dL — AB (ref 3.5–5.0)
ALK PHOS: 55 U/L (ref 38–126)
ALT: 28 U/L (ref 17–63)
ANION GAP: 5 (ref 5–15)
AST: 53 U/L — ABNORMAL HIGH (ref 15–41)
BUN: 13 mg/dL (ref 6–20)
CALCIUM: 7.7 mg/dL — AB (ref 8.9–10.3)
CHLORIDE: 108 mmol/L (ref 101–111)
CO2: 25 mmol/L (ref 22–32)
Creatinine, Ser: 0.79 mg/dL (ref 0.61–1.24)
GFR calc non Af Amer: >60 mL/min (ref >60)
Glucose, Bld: 87 mg/dL (ref 65–99)
POTASSIUM: 3.2 mmol/L — AB (ref 3.5–5.1)
SODIUM: 138 mmol/L (ref 135–145)
Total Bilirubin: 2.9 mg/dL — ABNORMAL HIGH (ref 0.3–1.2)
Total Protein: 5.6 g/dL — ABNORMAL LOW (ref 6.5–8.1)

## 2015-09-24 LAB — LACTATE DEHYDROGENASE, PLEURAL OR PERITONEAL FLUID: LD FL: 46 U/L — AB (ref 3–23)

## 2015-09-24 LAB — BODY FLUID CELL COUNT WITH DIFFERENTIAL
LYMPHS FL: 26 %
MONOCYTE-MACROPHAGE-SEROUS FLUID: 49 % — AB (ref 50–90)
NEUTROPHIL FLUID: 25 % (ref 0–25)
Total Nucleated Cell Count, Fluid: 240 cu mm (ref 0–1000)

## 2015-09-24 LAB — PROTIME-INR
INR: 1.85 — ABNORMAL HIGH (ref 0.00–1.49)
Prothrombin Time: 21.3 seconds — ABNORMAL HIGH (ref 11.6–15.2)

## 2015-09-24 LAB — HEMOGLOBIN A1C
HEMOGLOBIN A1C: 4.4 % — AB (ref 4.8–5.6)
MEAN PLASMA GLUCOSE: 80 mg/dL

## 2015-09-24 LAB — BASIC METABOLIC PANEL
Anion gap: 4 — ABNORMAL LOW (ref 5–15)
BUN: 11 mg/dL (ref 6–20)
CALCIUM: 8 mg/dL — AB (ref 8.9–10.3)
CO2: 27 mmol/L (ref 22–32)
CREATININE: 0.73 mg/dL (ref 0.61–1.24)
Chloride: 109 mmol/L (ref 101–111)
GFR calc Af Amer: >60 mL/min (ref >60)
GLUCOSE: 94 mg/dL (ref 65–99)
POTASSIUM: 3.5 mmol/L (ref 3.5–5.1)
SODIUM: 140 mmol/L (ref 135–145)

## 2015-09-24 LAB — TROPONIN I

## 2015-09-24 LAB — URINALYSIS, ROUTINE W REFLEX MICROSCOPIC
BILIRUBIN URINE: NEGATIVE
Glucose, UA: NEGATIVE mg/dL
HGB URINE DIPSTICK: NEGATIVE
KETONES UR: NEGATIVE mg/dL
Leukocytes, UA: NEGATIVE
NITRITE: NEGATIVE
PROTEIN: NEGATIVE mg/dL
Specific Gravity, Urine: 1.026 (ref 1.005–1.030)
pH: 5.5 (ref 5.0–8.0)

## 2015-09-24 LAB — GRAM STAIN

## 2015-09-24 MED ORDER — POTASSIUM CHLORIDE CRYS ER 20 MEQ PO TBCR
40.0000 meq | EXTENDED_RELEASE_TABLET | ORAL | Status: AC
  Administered 2015-09-24 (×2): 40 meq via ORAL
  Filled 2015-09-24 (×2): qty 2

## 2015-09-24 MED ORDER — LIDOCAINE HCL 1 % IJ SOLN
INTRAMUSCULAR | Status: AC
Start: 2015-09-24 — End: 2015-09-24
  Filled 2015-09-24: qty 20

## 2015-09-24 NOTE — Procedures (Signed)
   US guided RLQ paracentesis 3 liters maximum per MD  Yellow fluid Sent for labs per MD  Tolerated well

## 2015-09-24 NOTE — Progress Notes (Addendum)
PROGRESS NOTE                                                                                                                                                                                                             Patient Demographics:    Roy Case, is a 50 y.o. male, DOB - [birth year], [id number]  Admit date - 09/23/2015   Admitting Physician Richarda OverlieNayana Abrol, MD  Outpatient Primary MD for the patient is Jeanine Luzalone, Gregory, FNP  LOS - 1  Chief Complaint  Patient presents with  . Loss of Consciousness  . Abdominal Pain       Brief Narrative   50 y.o. male with medical history significant for idiopathic hemachromatosis managed with phlebotomies, liver cirrhosis, chronic respiratory failure with supplemental oxygen requirement of 4-5 L/m, and pleural effusions with Pleurx catheter in the right chest presented to the ED with syncope and collapse related to dehydration,fever at home of 100.5.   Subjective:    Roy Case today has, No headache, No chest pain, No abdominal pain - reports generalized weakness and fatigue  Assessment  & Plan :    Active Problems:   Syncope and collapse  Syncope and collapse - Most likely due to dehydration and volume depletion from what is likely a viral gastroenteritis - Continue to monitor on telemetry, negative troponins, 2-D echo pending - Continue to hold Lasix, continue with Aldactone - Continue with gentle hydration - Rule out infectious process, ultrasound paracentesis pending  Diarrhea - Most likely due to viral disease, - Follow on C. difficile by PCR and GI panel  Liver cirrhosis with ascites - Last paracentesis 6 weeks ago, for paracentesis today, on antibiotics empirically pending results on paracentesis, low probability for SBP giving afebrile, leukocytosis, and no abdominal tenderness and physical exam.  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 - Pleurx catheter, reports currently and once a week, requested staff to drain once today  Thrombocytopenia - Secondary to liver cirrhosis and hemochromatosis - Chronic, at baseline, no evidence of active bleed  Hypokalemia - Repleted, check magnesium level     Code Status : Full  Family Communication  : None at bedside  Disposition Plan  : Home when stable  Consults  :  None  Procedures  : None  DVT Prophylaxis  :  SCDs  Lab Results  Component Value Date   PLT 58* 09/24/2015    Antibiotics  :   Anti-infectives    Start     Dose/Rate Route Frequency Ordered Stop   09/23/15 2300  aztreonam (AZACTAM) 1 g in dextrose 5 % 50 mL IVPB     1 g 100 mL/hr over 30 Minutes Intravenous Every 8 hours 09/23/15 2236     09/23/15 2300  metroNIDAZOLE (FLAGYL) IVPB 500 mg     500 mg 100 mL/hr over 60 Minutes Intravenous Every 8 hours 09/23/15 2236     09/23/15 1800  levofloxacin (LEVAQUIN) IVPB 750 mg  Status:  Discontinued     750 mg 100 mL/hr over 90 Minutes Intravenous Every 24 hours 09/23/15 1749 09/23/15 2223        Objective:   Filed Vitals:   09/24/15 0803 09/24/15 0938 09/24/15 1001 09/24/15 1013  BP:  122/73 117/73 110/70  Pulse:      Temp:      TempSrc:      Resp:      Height:      Weight:      SpO2: 95%       Wt Readings from Last 3 Encounters:  09/24/15 81.2 kg (179 lb 0.2 oz)  08/27/15 81.647 kg (180 lb)  08/21/15 81.1 kg (178 lb 12.7 oz)     Intake/Output Summary (Last 24 hours) at 09/24/15 1035 Last data filed at 09/24/15 0550  Gross per 24 hour  Intake   2300 ml  Output    175 ml  Net   2125 ml     Physical Exam  Awake Alert, Oriented X 3,  Supple Neck,No JVD,  Symmetrical Chest wall movement, Good air movement bilaterally, CTAB RRR,No Gallops,Rubs or new Murmurs, No Parasternal Heave +ve B.Sounds, Abd Soft, No tenderness,Mild  ascites, No rebound - guarding or rigidity. No Cyanosis, Clubbing ,.+1 edema B/L.    Data Review:    CBC  Recent Labs Lab 09/23/15 1551 09/24/15 0512  WBC 4.9 3.2*  HGB 12.5* 11.6*  HCT 36.2* 34.1*  PLT 69* 58*  MCV 106.5* 106.9*  MCH 36.8* 36.4*  MCHC 34.5 34.0  RDW 15.3 15.1    Chemistries   Recent Labs Lab 09/23/15 1435 09/23/15 1824 09/24/15 0512  NA 138  --  138  K 3.2*  --  3.2*  CL 108  --  108  CO2 24  --  25  GLUCOSE 92  --  87  BUN 13  --  13  CREATININE 0.85  --  0.79  CALCIUM 7.9*  --  7.7*  MG  --  1.8  --   AST 64*  --  53*  ALT 32  --  28  ALKPHOS 71  --  55  BILITOT 2.6*  --  2.9*   ------------------------------------------------------------------------------------------------------------------ No results for input(s): CHOL, HDL, LDLCALC, TRIG, CHOLHDL, LDLDIRECT in the last 72 hours.  Lab Results  Component Value Date   HGBA1C 4.4* 09/23/2015   ------------------------------------------------------------------------------------------------------------------  Recent Labs  09/23/15 1902  TSH 2.008   ------------------------------------------------------------------------------------------------------------------ No results for input(s): VITAMINB12, FOLATE, FERRITIN, TIBC, IRON, RETICCTPCT in the last 72 hours.  Coagulation profile  Recent Labs Lab 09/24/15 0512  INR 1.85*    No results for input(s): DDIMER in the last 72 hours.  Cardiac Enzymes  Recent Labs Lab 09/23/15 1824 09/23/15 2318 09/24/15 0865  TROPONINI <0.03 <0.03 <0.03   ------------------------------------------------------------------------------------------------------------------    Component Value Date/Time   BNP 25.5 08/02/2015 2124    Inpatient Medications  Scheduled Meds: . aztreonam  1 g Intravenous Q8H  . fluticasone furoate-vilanterol  1 puff Inhalation Daily  . lidocaine      . metronidazole  500 mg Intravenous Q8H  . pantoprazole  40  mg Oral Daily  . sodium chloride flush  3 mL Intravenous Q12H  . spironolactone  25 mg Oral BID   Continuous Infusions: . sodium chloride 1,000 mL (09/24/15 0014)   PRN Meds:.albuterol, ALPRAZolam, levalbuterol, ondansetron **OR** ondansetron (ZOFRAN) IV, oxyCODONE  Micro Results Recent Results (from the past 240 hour(s))  MRSA PCR Screening     Status: None   Collection Time: 09/23/15  7:53 PM  Result Value Ref Range Status   MRSA by PCR NEGATIVE NEGATIVE Final    Comment:        The GeneXpert MRSA Assay (FDA approved for NASAL specimens only), is one component of a comprehensive MRSA colonization surveillance program. It is not intended to diagnose MRSA infection nor to guide or monitor treatment for MRSA infections.     Radiology Reports Portable Chest 1 View  09/24/2015  CLINICAL DATA:  Syncope and collapse EXAM: PORTABLE CHEST 1 VIEW COMPARISON:  Abdominal series of September 23, 2015 FINDINGS: The lungs are less well inflated today. Bibasilar densities are more conspicuous today with obscuration of portions of the left hemidiaphragm. The mediastinum is normal in width. The pulmonary vascularity is prominent centrally. The PleurX chest tube tip projects in the medial right cardio phrenic angle. IMPRESSION: 1. No pneumothorax. Small left pleural effusion. Stable right PleurX catheter. 2. Mild cardiomegaly with central pulmonary vascular prominence suggestive of low-grade CHF. Electronically Signed   By: David  [place] M.D.   On: 09/24/2015 07:17   Dg Abd Acute W/chest  09/23/2015  CLINICAL DATA:  Vomiting and diarrhea EXAM: DG ABDOMEN ACUTE W/ 1V CHEST COMPARISON:  Chest radiograph August 22, 2015; CT abdomen April 06, 2015 FINDINGS: PA chest: There is no edema or consolidation. PleurX catheter on the right is again noted. Heart size and pulmonary vascularity are normal. No adenopathy. Supine and upright abdomen: There is moderate stool throughout the colon. There is no bowel dilatation  or air-fluid level suggesting obstruction. No free air evident. There are multiple surgical clips in the right upper abdomen. IMPRESSION: Bowel gas pattern unremarkable. No demonstrable obstruction or free air. No parenchymal lung edema or consolidation. Electronically Signed   By: Bretta Bang III M.D.   On: 09/23/2015 15:51      Vincient Vanaman M.D on 09/24/2015 at 10:35 AM  Between 7am to 7pm - Pager - [phone]  After 7pm go to [email or web address] - password [facility]  [facility] -  Office  [phone]

## 2015-09-25 ENCOUNTER — Other Ambulatory Visit (HOSPITAL_COMMUNITY): Payer: BLUE CROSS/BLUE SHIELD

## 2015-09-25 ENCOUNTER — Inpatient Hospital Stay (HOSPITAL_COMMUNITY): Payer: BLUE CROSS/BLUE SHIELD

## 2015-09-25 DIAGNOSIS — D696 Thrombocytopenia, unspecified: Secondary | ICD-10-CM

## 2015-09-25 DIAGNOSIS — J948 Other specified pleural conditions: Secondary | ICD-10-CM

## 2015-09-25 DIAGNOSIS — K769 Liver disease, unspecified: Secondary | ICD-10-CM

## 2015-09-25 LAB — BASIC METABOLIC PANEL
ANION GAP: 4 — AB (ref 5–15)
BUN: 8 mg/dL (ref 6–20)
CALCIUM: 7.5 mg/dL — AB (ref 8.9–10.3)
CHLORIDE: 107 mmol/L (ref 101–111)
CO2: 24 mmol/L (ref 22–32)
CREATININE: 0.66 mg/dL (ref 0.61–1.24)
GFR calc non Af Amer: >60 mL/min (ref >60)
Glucose, Bld: 99 mg/dL (ref 65–99)
Potassium: 3.4 mmol/L — ABNORMAL LOW (ref 3.5–5.1)
SODIUM: 135 mmol/L (ref 135–145)

## 2015-09-25 LAB — TRIGLYCERIDES, BODY FLUIDS: TRIGLYCERIDES FL: 24 mg/dL

## 2015-09-25 LAB — C DIFFICILE QUICK SCREEN W PCR REFLEX
C Diff antigen: NEGATIVE
C Diff interpretation: NOT DETECTED
C Diff toxin: NEGATIVE

## 2015-09-25 LAB — MAGNESIUM: Magnesium: 1.9 mg/dL (ref 1.7–2.4)

## 2015-09-25 MED ORDER — SODIUM CHLORIDE 0.9 % IV SOLN
INTRAVENOUS | Status: DC
  Administered 2015-09-25 – 2015-10-08 (×3): via INTRAVENOUS

## 2015-09-25 MED ORDER — POTASSIUM CHLORIDE CRYS ER 20 MEQ PO TBCR
30.0000 meq | EXTENDED_RELEASE_TABLET | ORAL | Status: AC
  Administered 2015-09-25 (×2): 30 meq via ORAL
  Filled 2015-09-25 (×2): qty 1

## 2015-09-25 NOTE — Evaluation (Signed)
Physical Therapy Evaluation Patient Details Name: Roy Case L Soman MRN: [id number] DOB: 11/11/1965 Today's Date: 09/25/2015   History of Present Illness  50 y.o. male with medical history significant for idiopathic hemachromatosis managed with phlebotomies, liver cirrhosis, chronic respiratory failure with supplemental oxygen requirement of 4-5 L/m, and pleural effusions with Pleurx catheter in the right chest presented to the ED with syncope and collapse related to dehydration,fever at home of 100.5.Paracentesis on 7/10, with 3 L, no evidence of SBP, has Pleurx cath, 500 mL drained on 7/10  Clinical Impression  Patient presents with decreased independence with mobility due to deficits listed in PT problem list.  He will benefit from skilled PT in the acute setting to allow return home with at least initial 24 hour assist.  Patient seems to think his local cousins can assist, but if no 24 hour assist available, may need STSNF rehab.  PT to follow.    Follow Up Recommendations Supervision/Assistance - 24 hour;Home health PT    Equipment Recommendations  Rolling walker with 5" wheels    Recommendations for Other Services       Precautions / Restrictions Precautions Precautions: Fall Precaution Comments: O2 dependent, hypoxic with ambulation      Mobility  Bed Mobility Overal bed mobility: Modified Independent                Transfers Overall transfer level: Needs assistance Equipment used: None Transfers: Sit to/from Stand Sit to Stand: Min guard         General transfer comment: posterior loss of balance initially when standing, assist for balance and safety, pt used walker for ambulation due to imbalance  Ambulation/Gait Ambulation/Gait assistance: Min guard Ambulation Distance (Feet): 80 Feet Assistive device: Rolling walker (2 wheeled) Gait Pattern/deviations: Step-through pattern;Decreased stride length;Trunk flexed Gait velocity: very slow   General Gait  Details: trunk flexed due to walker too short, pt with SpO2 88% on 6L initially, dropped to 61% and pt admitted to light headedness, returned to room and pt sat with cues for pursed lip breathing up to 91% after seated rest about 5 minutes.  RN aware  Stairs            Wheelchair Mobility    Modified Rankin (Stroke Patients Only)       Balance Overall balance assessment: Needs assistance   Sitting balance-Leahy Scale: Good     Standing balance support: No upper extremity supported Standing balance-Leahy Scale: Poor Standing balance comment: posterior bias initially in standing, needed UE support for safety or legs braced to side of bed                             Pertinent Vitals/Pain Pain Assessment: 0-10 Pain Score: 4  Pain Location: back Pain Descriptors / Indicators: Aching Pain Intervention(s): Monitored during session;Repositioned    Home Living Family/patient expects to be discharged to:: Private residence Living Arrangements: Alone Available Help at Discharge: Family Type of Home: House Home Access: Stairs to enter   Secretary/administratorntrance Stairs-Number of Steps: 3 Home Layout: One level Home Equipment: None Additional Comments: on home O2    Prior Function Level of Independence: Independent         Comments: doesn't drive, pastor or cousins who live close help with transportation     Hand Dominance        Extremity/Trunk Assessment               Lower Extremity Assessment:  Generalized weakness      Cervical / Trunk Assessment: Kyphotic  Communication   Communication: No difficulties  Cognition Arousal/Alertness: Awake/alert Behavior During Therapy: WFL for tasks assessed/performed Overall Cognitive Status: Within Functional Limits for tasks assessed                      General Comments      Exercises        Assessment/Plan    PT Assessment Patient needs continued PT services  PT Diagnosis Generalized  weakness;Difficulty walking;Abnormality of gait   PT Problem List Decreased strength;Decreased activity tolerance;Decreased balance;Decreased mobility;Decreased knowledge of use of DME;Decreased safety awareness;Decreased knowledge of precautions;Cardiopulmonary status limiting activity  PT Treatment Interventions DME instruction;Balance training;Gait training;Functional mobility training;Patient/family education;Stair training;Therapeutic activities;Therapeutic exercise   PT Goals (Current goals can be found in the Care Plan section) Acute Rehab PT Goals Patient Stated Goal: TO get stronger PT Goal Formulation: With patient Time For Goal Achievement: 10/02/15 Potential to Achieve Goals: Good    Frequency Min 3X/week   Barriers to discharge        Co-evaluation               End of Session Equipment Utilized During Treatment: Gait belt;Oxygen Activity Tolerance: Patient limited by fatigue;Treatment limited secondary to medical complications (Comment) (hypoxic ambulating on 6L O2) Patient left: in bed Nurse Communication: Other (comment) (pt request for IS and flutter valve and inhaler)         Time: 1610-9604 PT Time Calculation (min) (ACUTE ONLY): 26 min   Charges:   PT Evaluation $PT Eval Moderate Complexity: 1 Procedure PT Treatments $Gait Training: 8-22 mins   PT G CodesElray Mcgregor Oct 16, 2015, 1:14 PM  Sheran Lawless, PT [phone] 10/16/2015

## 2015-09-25 NOTE — Progress Notes (Signed)
PROGRESS NOTE                                                                                                                                                                                                             Patient Demographics:    Roy Case, is a 50 y.o. male, DOB - June 12, 1965, [id number]  Admit date - 09/23/2015   Admitting Physician Richarda Overlie, MD  Outpatient Primary MD for the patient is Jeanine Luz, FNP  LOS - 2  Chief Complaint  Patient presents with  . Loss of Consciousness  . Abdominal Pain       Brief Narrative   50 y.o. male with medical history significant for idiopathic hemachromatosis managed with phlebotomies, liver cirrhosis, chronic respiratory failure with supplemental oxygen requirement of 4-5 L/m, and pleural effusions with Pleurx catheter in the right chest presented to the ED with syncope and collapse related to dehydration,fever at home of 100.5.Paracentesis on 7/10, with 3 L, no evidence of SBP, has Pleurx cath, 500 mL drained on 7/10.   Subjective:    Roy Case today has, No headache, No chest pain, No abdominal pain - reports generalized weakness and fatigue  Assessment  & Plan :    Active Problems:   Chronic respiratory failure with hypoxia (HCC)   Hepatic cirrhosis (HCC)   Thrombocytopenia (HCC)   Pleural effusion associated with hepatic disorder   Syncope and collapse  Syncope and collapse - Most likely due to dehydration and volume depletion from what is likely a viral gastroenteritis - Continue to monitor on telemetry, negative troponins, 2-D echo pending - Continue to hold Lasix, continue with Aldactone - Continue with gentle hydration - With generalized weakness, will need PT consult  Diarrhea - Most likely due to viral disease, - C. difficile is negative, and GI panel is pending - Improving  Liver cirrhosis with ascites - Last paracentesis 6 weeks ago, had paracentesis on  6/11, 3 L drained, no evidence of SBP,  leukocytosis, and no abdominal tenderness and physical exam, so will stop Flagyl and aztreonam. - Patient appeared to be clinically dehydrated, but in the same time with lower extremity edema, now with continued gentle hydration, continue to hold Lasix.   Chronic respiratory failure with hypoxia - Patient is hypoxic 88% on his baseline 4-5 L nasal cannula, was drained 500 mL  from his Pleurx catheter yesterday, will repeat 2 view chest x-ray today for further evaluation.  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, at baseline, no evidence of active bleed  Hypokalemia - Repleted, check magnesium level     Code Status : Full  Family Communication  : None at bedside  Disposition Plan  : Pending PT evaluation  Consults  :  None  Procedures  : None  DVT Prophylaxis  :  SCDs given significant from cytopenia  Lab Results  Component Value Date   PLT 62* 09/24/2015    Antibiotics  :   Anti-infectives    Start     Dose/Rate Route Frequency Ordered Stop   09/23/15 2300  aztreonam (AZACTAM) 1 g in dextrose 5 % 50 mL IVPB  Status:  Discontinued     1 g 100 mL/hr over 30 Minutes Intravenous Every 8 hours 09/23/15 2236 09/25/15 0718   09/23/15 2300  metroNIDAZOLE (FLAGYL) IVPB 500 mg  Status:  Discontinued     500 mg 100 mL/hr over 60 Minutes Intravenous Every 8 hours 09/23/15 2236 09/25/15 0718   09/23/15 1800  levofloxacin (LEVAQUIN) IVPB 750 mg  Status:  Discontinued     750 mg 100 mL/hr over 90 Minutes Intravenous Every 24 hours 09/23/15 1749 09/23/15 2223        Objective:   Filed Vitals:   09/25/15 0733 09/25/15 0748 09/25/15 0750 09/25/15 0752  BP:  106/68 119/74 114/82  Pulse: 101 103 111 100  Temp:    99.1  F (37.3 C)  TempSrc:    Oral  Resp: 15 15 16 14   Height:      Weight:      SpO2: 90% 90%      Wt Readings from Last 3 Encounters:  09/24/15 81.2 kg (179 lb 0.2 oz)  08/27/15 81.647 kg (180 lb)  08/21/15 81.1 kg (178 lb 12.7 oz)     Intake/Output Summary (Last 24 hours) at 09/25/15 1226 Last data filed at 09/25/15 0949  Gross per 24 hour  Intake   1545 ml  Output    975 ml  Net    570 ml     Physical Exam  Awake Alert, Oriented X 3,  Supple Neck,No JVD,  Symmetrical Chest wall movement, Good air movement bilaterally, CTAB RRR,No Gallops,Rubs or new Murmurs, No Parasternal Heave +ve B.Sounds, Abd Soft, No tenderness,no  Ascites (after paracentesis yesterday), No rebound - guarding or rigidity. No Cyanosis, Clubbing ,.+1 edema B/L.    Data Review:    CBC  Recent Labs Lab 09/23/15 1551 09/24/15 0512 09/24/15 1113  WBC 4.9 3.2* 4.0  HGB 12.5* 11.6* 12.6*  HCT 36.2* 34.1* 36.5*  PLT 69* 58* 62*  MCV 106.5* 106.9* 107.0*  MCH 36.8* 36.4* 37.0*  MCHC 34.5 34.0 34.5  RDW 15.3 15.1 15.1    Chemistries   Recent Labs Lab 09/23/15 1435 09/23/15 1824 09/24/15 0512 09/24/15 1113 09/25/15 0556  NA 138  --  138 140 135  K 3.2*  --  3.2* 3.5 3.4*  CL 108  --  108 109 107  CO2 24  --  25 27 24   GLUCOSE 92  --  87 94 99  BUN 13  --  13 11 8   CREATININE 0.85  --  0.79 0.73 0.66  CALCIUM 7.9*  --  7.7* 8.0* 7.5*  MG  --  1.8  --   --  1.9  AST 64*  --  53*  --   --   ALT 32  --  28  --   --   ALKPHOS 71  --  55  --   --   BILITOT 2.6*  --  2.9*  --   --    ------------------------------------------------------------------------------------------------------------------ No results for input(s): CHOL, HDL, LDLCALC, TRIG, CHOLHDL, LDLDIRECT in the last 72 hours.  Lab Results  Component Value Date   HGBA1C 4.4* 09/23/2015   ------------------------------------------------------------------------------------------------------------------  Recent Labs   09/23/15 1902  TSH 2.008   ------------------------------------------------------------------------------------------------------------------ No results for input(s): VITAMINB12, FOLATE, FERRITIN, TIBC, IRON, RETICCTPCT in the last 72 hours.  Coagulation profile  Recent Labs Lab 09/24/15 0512  INR 1.85*    No results for input(s): DDIMER in the last 72 hours.  Cardiac Enzymes  Recent Labs Lab 09/23/15 1824 09/23/15 2318 09/24/15 0512  TROPONINI <0.03 <0.03 <0.03   ------------------------------------------------------------------------------------------------------------------    Component Value Date/Time   BNP 25.5 08/02/2015 2124    Inpatient Medications  Scheduled Meds: . fluticasone furoate-vilanterol  1 puff Inhalation Daily  . pantoprazole  40 mg Oral Daily  . potassium chloride  30 mEq Oral Q4H  . sodium chloride flush  3 mL Intravenous Q12H  . spironolactone  25 mg Oral BID   Continuous Infusions: . sodium chloride 75 mL/hr at 09/25/15 0811   PRN Meds:.albuterol, ALPRAZolam, levalbuterol, ondansetron **OR** ondansetron (ZOFRAN) IV, oxyCODONE  Micro Results Recent Results (from the past 240 hour(s))  MRSA PCR Screening     Status: None   Collection Time: 09/23/15  7:53 PM  Result Value Ref Range Status   MRSA by PCR NEGATIVE NEGATIVE Final    Comment:        The GeneXpert MRSA Assay (FDA approved for NASAL specimens only), is one component of a comprehensive MRSA colonization surveillance program. It is not intended to diagnose MRSA infection nor to guide or monitor treatment for MRSA infections.   Gram stain     Status: None   Collection Time: 09/24/15 10:17 AM  Result Value Ref Range Status   Specimen Description FLUID PERITONEAL  Final   Special Requests NONE  Final   Gram Stain   Final    MODERATE WBC PRESENT,BOTH PMN AND MONONUCLEAR NO ORGANISMS SEEN    Report Status 09/24/2015 FINAL  Final  C difficile quick scan w PCR reflex      Status: None   Collection Time: 09/25/15 10:23 AM  Result Value Ref Range Status   C Diff antigen NEGATIVE NEGATIVE Final   C Diff toxin NEGATIVE NEGATIVE Final   C Diff interpretation No C. difficile detected.  Final    Radiology Reports US Paracentesis  09/24/2015  INDICATION: Recurrent ascites EXAM: ULTRASOUND-GUIDED PARACENTESIS COMPARISON:  Previous paracentesis MEDICATIONS: 10 cc 1% lidocaine COMPLICATIONS: None immediate. TECHNIQUE: Informed written consent was obtained from the patient after a discussion of the risks, benefits and alternatives to treatment. A timeout was performed prior to the initiation of the procedure. Initial ultrasound scanning demonstrates a large amount of ascites within the right lower abdominal quadrant. The right lower abdomen was prepped and draped in the usual sterile fashion. 1% lidocaine with epinephrine was used for local anesthesia. Under direct ultrasound guidance, a 19 gauge, 7-cm, Yueh catheter was introduced. An ultrasound image was saved for documentation purposed. The paracentesis was performed. The catheter  was removed and a dressing was applied. The patient tolerated the procedure well without immediate post procedural complication. FINDINGS: A total of approximately 3 liters of yellow fluid was removed. Samples were sent to the laboratory as requested by the clinical team. IMPRESSION: Successful ultrasound-guided paracentesis yielding 3 liters of peritoneal fluid. Maximum per MD Read by: Robet LeuPamela A Turpin [facility] Electronically Signed   By: Irish LackGlenn  Yamagata M.D.   On: 09/24/2015 10:17   Portable Chest 1 View  09/24/2015  CLINICAL DATA:  Syncope and collapse EXAM: PORTABLE CHEST 1 VIEW COMPARISON:  Abdominal series of September 23, 2015 FINDINGS: The lungs are less well inflated today. Bibasilar densities are more conspicuous today with obscuration of portions of the left hemidiaphragm. The mediastinum is normal in width. The pulmonary vascularity is prominent  centrally. The PleurX chest tube tip projects in the medial right cardio phrenic angle. IMPRESSION: 1. No pneumothorax. Small left pleural effusion. Stable right PleurX catheter. 2. Mild cardiomegaly with central pulmonary vascular prominence suggestive of low-grade CHF. Electronically Signed   By: David  SwazilandJordan M.D.   On: 09/24/2015 07:17   Dg Abd Acute W/chest  09/23/2015  CLINICAL DATA:  Vomiting and diarrhea EXAM: DG ABDOMEN ACUTE W/ 1V CHEST COMPARISON:  Chest radiograph August 22, 2015; CT abdomen April 06, 2015 FINDINGS: PA chest: There is no edema or consolidation. PleurX catheter on the right is again noted. Heart size and pulmonary vascularity are normal. No adenopathy. Supine and upright abdomen: There is moderate stool throughout the colon. There is no bowel dilatation or air-fluid level suggesting obstruction. No free air evident. There are multiple surgical clips in the right upper abdomen. IMPRESSION: Bowel gas pattern unremarkable. No demonstrable obstruction or free air. No parenchymal lung edema or consolidation. Electronically Signed   By: Bretta BangWilliam  Woodruff III M.D.   On: 09/23/2015 15:51      ELGERGAWY, DAWOOD M.D on 09/25/2015 at 12:26 PM  Between 7am to 7pm - Pager - [phone]  After 7pm go to [email or web address] - password [email or web address]  [facility] -  Office  [phone]

## 2015-09-26 ENCOUNTER — Inpatient Hospital Stay (HOSPITAL_COMMUNITY): Payer: BLUE CROSS/BLUE SHIELD

## 2015-09-26 DIAGNOSIS — R55 Syncope and collapse: Secondary | ICD-10-CM

## 2015-09-26 LAB — CBC
HCT: 32.4 % — ABNORMAL LOW (ref 39.0–52.0)
Hemoglobin: 11.5 g/dL — ABNORMAL LOW (ref 13.0–17.0)
MCH: 37.5 pg — ABNORMAL HIGH (ref 26.0–34.0)
MCHC: 35.5 g/dL (ref 30.0–36.0)
MCV: 105.5 fL — ABNORMAL HIGH (ref 78.0–100.0)
Platelets: 62 10*3/uL — ABNORMAL LOW (ref 150–400)
RBC: 3.07 MIL/uL — ABNORMAL LOW (ref 4.22–5.81)
RDW: 15 % (ref 11.5–15.5)
WBC: 5 10*3/uL (ref 4.0–10.5)

## 2015-09-26 LAB — GASTROINTESTINAL PANEL BY PCR, STOOL (REPLACES STOOL CULTURE)
ADENOVIRUS F40/41: NOT DETECTED
ASTROVIRUS: NOT DETECTED
CRYPTOSPORIDIUM: NOT DETECTED
CYCLOSPORA CAYETANENSIS: NOT DETECTED
Campylobacter species: NOT DETECTED
E. coli O157: NOT DETECTED
ENTEROAGGREGATIVE E COLI (EAEC): NOT DETECTED
ENTEROPATHOGENIC E COLI (EPEC): NOT DETECTED
ENTEROTOXIGENIC E COLI (ETEC): NOT DETECTED
Entamoeba histolytica: NOT DETECTED
GIARDIA LAMBLIA: NOT DETECTED
Norovirus GI/GII: NOT DETECTED
Plesimonas shigelloides: NOT DETECTED
ROTAVIRUS A: NOT DETECTED
SHIGA LIKE TOXIN PRODUCING E COLI (STEC): NOT DETECTED
Salmonella species: NOT DETECTED
Sapovirus (I, II, IV, and V): NOT DETECTED
Shigella/Enteroinvasive E coli (EIEC): NOT DETECTED
VIBRIO CHOLERAE: NOT DETECTED
VIBRIO SPECIES: NOT DETECTED
YERSINIA ENTEROCOLITICA: NOT DETECTED

## 2015-09-26 LAB — URINALYSIS, ROUTINE W REFLEX MICROSCOPIC
BILIRUBIN URINE: NEGATIVE
Glucose, UA: NEGATIVE mg/dL
Hgb urine dipstick: NEGATIVE
Ketones, ur: NEGATIVE mg/dL
NITRITE: POSITIVE — AB
PH: 6 (ref 5.0–8.0)
PROTEIN: NEGATIVE mg/dL
Specific Gravity, Urine: 1.018 (ref 1.005–1.030)

## 2015-09-26 LAB — BASIC METABOLIC PANEL
Anion gap: 4 — ABNORMAL LOW (ref 5–15)
BUN: 6 mg/dL (ref 6–20)
CALCIUM: 7.5 mg/dL — AB (ref 8.9–10.3)
CO2: 25 mmol/L (ref 22–32)
CREATININE: 0.59 mg/dL — AB (ref 0.61–1.24)
Chloride: 106 mmol/L (ref 101–111)
GFR calc non Af Amer: >60 mL/min (ref >60)
Glucose, Bld: 99 mg/dL (ref 65–99)
Potassium: 3.7 mmol/L (ref 3.5–5.1)
SODIUM: 135 mmol/L (ref 135–145)

## 2015-09-26 LAB — URINE MICROSCOPIC-ADD ON

## 2015-09-26 LAB — ECHOCARDIOGRAM COMPLETE
HEIGHTINCHES: 69 in
WEIGHTICAEL: 2864.22 [oz_av]

## 2015-09-26 MED ORDER — FUROSEMIDE 40 MG PO TABS
40.0000 mg | ORAL_TABLET | Freq: Two times a day (BID) | ORAL | Status: DC
  Administered 2015-09-26 – 2015-10-06 (×20): 40 mg via ORAL
  Filled 2015-09-26 (×21): qty 1

## 2015-09-26 MED ORDER — OXYCODONE HCL 5 MG PO TABS
5.0000 mg | ORAL_TABLET | ORAL | Status: DC | PRN
  Administered 2015-09-27 – 2015-10-09 (×35): 5 mg via ORAL
  Filled 2015-09-26 (×35): qty 1

## 2015-09-26 MED ORDER — MORPHINE SULFATE (PF) 2 MG/ML IV SOLN
2.0000 mg | INTRAVENOUS | Status: AC | PRN
  Administered 2015-09-26 – 2015-09-27 (×3): 2 mg via INTRAVENOUS
  Filled 2015-09-26 (×3): qty 1

## 2015-09-26 MED ORDER — POTASSIUM CHLORIDE CRYS ER 20 MEQ PO TBCR
20.0000 meq | EXTENDED_RELEASE_TABLET | ORAL | Status: AC
  Administered 2015-09-26 (×2): 20 meq via ORAL
  Filled 2015-09-26 (×2): qty 1

## 2015-09-26 NOTE — Progress Notes (Signed)
Per discussion with patient family- patient lives alone and does not have any help. Unable to cook, clean, care for self independently. Patient does not have any living parents, children, or siblings. Closest relatives are his cousin and his cousin's  wife  who are in their 4770's and have their own health problems. Family would feel better if patient was in rehab or SNF to help him after discharge. Also [place] and [place] [facility] [place], as other family members live there. Information shared with Case manager, Cammie McgeeHenrietta. Social work consulted as well.  Noe GensStefanie A Kaylany Tesoriero, RN

## 2015-09-26 NOTE — Progress Notes (Signed)
[facility]  PROGRESS NOTE  Roy Case ZOX:[id number] DOB: 1965-08-03 DOA: 09/23/2015 PCP: Jeanine Luzalone, Gregory, FNP    Brief HPI:   Active Problems:   Chronic respiratory failure with hypoxia (HCC)   Hepatic cirrhosis (HCC)   Thrombocytopenia (HCC)   Pleural effusion associated with hepatic disorder   Syncope and collapse   Assessment/Plan: 1. Syncope and collapse- likely from dehydration and volume depletion from viral gastroenteritis. It is ordered this time. Troponin negative. Echo showed EF 60-65%, grade 1 diastolic dysfunction. Will restart Lasix 20 as patient now has stable blood pressure. Continue with Aldactone. 2. Diarrhea- resolved, likely from viral gastroenteritis. C. difficile is negative. GI pathogen panel is pending 3. Liver cirrhosis with ascites- stable, last paracentesis 6 weeks ago. On 08/26/2015 drained 2 L. No evidence of SBP. Flagyl and Azactam discontinued. Lasix restarted today. 4. Chronic respiratory failure with hypoxia- patient hypoxic with 80% on 4 L nasal cannula. Doesn't appear short of breath, chest x-ray is clear. Will change the site of pulse oximetry from finger to earlobe. 5. 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 Drained 500 mL on 09/24/2015. Patient would drain once a week every Wednesday 6. 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 7. Hypokalemia- replete, magnesium 1.9   DVT prophylaxis: SCD Code Status: Full code Family Communication: *No family at bedside Disposition Plan: Home with HHPT   Consultants:  None   Procedures:  None   Antibiotics:  Anti-infectives    Start   Dose/Rate Route Frequency Ordered Stop   09/23/15 2300  aztreonam (AZACTAM) 1 g in dextrose 5 % 50 mL IVPB Status: Discontinued    1 g 100 mL/hr over 30 Minutes Intravenous Every 8 hours 09/23/15 2236 09/25/15 0718   09/23/15 2300   metroNIDAZOLE (FLAGYL) IVPB 500 mg Status: Discontinued    500 mg 100 mL/hr over 60 Minutes Intravenous Every 8 hours 09/23/15 2236 09/25/15 0718   09/23/15 1800  levofloxacin (LEVAQUIN) IVPB 750 mg Status: Discontinued    750 mg 100 mL/hr over 90 Minutes Intravenous Every 24 hours 09/23/15 1749 09/23/15 2223           Subjective: Patient seen and examined, breathing is better, CXR shows small pleural effusions.  Objective: Filed Vitals:   09/26/15 0400 09/26/15 0743 09/26/15 1211 09/26/15 1214  BP: 109/70 115/60 121/78   Pulse:      Temp:  98.8 F (37.1 C) 97.7 F (36.5 C)   TempSrc:  Oral Axillary   Resp: 14 18 17    Height:      Weight:      SpO2: 92% 88% 95% 96%    Intake/Output Summary (Last 24 hours) at 09/26/15 1252 Last data filed at 09/26/15 1006  Gross per 24 hour  Intake   2163 ml  Output    175 ml  Net   1988 ml   Filed Weights   09/23/15 1422 09/24/15 0635  Weight: 78.019 kg (172 lb) 81.2 kg (179 lb 0.2 oz)    Examination:  General exam: Appears calm and comfortable  Respiratory system: Clear to auscultation. Respiratory effort normal. Cardiovascular system: S1 & S2 heard, RRR. No JVD, murmurs, rubs, gallops or clicks. Bilateral 1+ pedal edema. Gastrointestinal system: Abdomen is nondistended, soft and nontender. No organomegaly or masses felt. Normal bowel sounds heard. Central nervous system: Alert and oriented. No focal neurological deficits. Extremities: Symmetric 5 x 5 power. Bilateral Skin: No rashes, lesions or ulcers Psychiatry: Judgement and insight  appear normal. Mood & affect appropriate.    Data Reviewed: I have personally reviewed following labs and imaging studies Basic Metabolic Panel:  Recent Labs Lab 09/23/15 1435 09/23/15 1824 09/24/15 0512 09/24/15 1113 09/25/15 0556 09/26/15 0402  NA 138  --  138 140 135 135  K 3.2*  --  3.2* 3.5 3.4* 3.7  CL 108  --  108 109 107 106  CO2 24  --  GLUCOSE 92  --  87 94 99 99  BUN 13  --  CREATININE 0.85  --  0.79 0.73 0.66 0.59*  CALCIUM 7.9*  --  7.7* 8.0* 7.5* 7.5*  MG  --  1.8  --   --  1.9  --   PHOS  --  3.3  --   --   --   --    Liver Function Tests:  Recent Labs Lab 09/23/15 1435 09/24/15 0512  AST 64* 53*  ALT 32 28  ALKPHOS 71 55  BILITOT 2.6* 2.9*  PROT 6.2* 5.6*  ALBUMIN 2.3* 1.9*    Recent Labs Lab 09/23/15 1435  LIPASE 36  CBC:  Recent Labs Lab 09/23/15 1551 09/24/15 0512 09/24/15 1113 09/26/15 0402  WBC 4.9 3.2* 4.0 5.0  HGB 12.5* 11.6* 12.6* 11.5*  HCT 36.2* 34.1* 36.5* 32.4*  MCV 106.5* 106.9* 107.0* 105.5*  PLT 69* 58* 62* 62*   Cardiac Enzymes:  Recent Labs Lab 09/23/15 1824 09/23/15 2318 09/24/15 0512  TROPONINI <0.03 <0.03 <0.03   BNP (last 3 results)  Recent Labs  08/02/15 2124  BNP 25.5    ProBNP (last 3 results) No results for input(s): PROBNP in the last 8760 hours.  CBG: No results for input(s): GLUCAP in the last 168 hours.  Recent Results (from the past 240 hour(s))  MRSA PCR Screening     Status: None   Collection Time: 09/23/15  7:53 PM  Result Value Ref Range Status   MRSA by PCR NEGATIVE NEGATIVE Final    Comment:        The GeneXpert MRSA Assay (FDA approved for NASAL specimens only), is one component of a comprehensive MRSA colonization surveillance program. It is not intended to diagnose MRSA infection nor to guide or monitor treatment for MRSA infections.   Culture, body fluid-bottle     Status: None (Preliminary result)   Collection Time: 09/24/15 10:17 AM  Result Value Ref Range Status   Specimen Description FLUID PERITONEAL  Final   Special Requests NONE  Final   Culture NO GROWTH 1 DAY  Final   Report Status PENDING  Incomplete  Gram stain     Status: None   Collection Time: 09/24/15 10:17 AM  Result Value Ref Range Status   Specimen Description FLUID PERITONEAL  Final   Special Requests NONE  Final   Gram Stain   Final     MODERATE WBC PRESENT,BOTH PMN AND MONONUCLEAR NO ORGANISMS SEEN    Report Status 09/24/2015 FINAL  Final  C difficile quick scan w PCR reflex     Status: None   Collection Time: 09/25/15 10:23 AM  Result Value Ref Range Status   C Diff antigen NEGATIVE NEGATIVE Final   C Diff toxin NEGATIVE NEGATIVE Final   C Diff interpretation No C. difficile detected.  Final     Studies: Dg Chest 2 View  09/25/2015  CLINICAL DATA:  Fever and sob x 2 days EXAM: CHEST - 2 VIEW  COMPARISON:  09/24/2015 FINDINGS: Improved aeration. Left retrocardiac consolidation/ atelectasis, decreased since previous. Right tunneled pleural drain catheter stable. Heart size and mediastinal contours are within normal limits. Blunting of posterior costophrenic angles suggesting small pleural effusions. No pneumothorax. Visualized bones unremarkable. IMPRESSION: 1. Small bilateral pleural effusions, with stable position of right pleural drain catheter. 2. Improved aeration with mild residual left retrocardiac consolidation/atelectasis. Electronically Signed   By: Corlis Leak M.D.   On: 09/25/2015 14:37    Scheduled Meds: . fluticasone furoate-vilanterol  1 puff Inhalation Daily  . furosemide  40 mg Oral BID  . pantoprazole  40 mg Oral Daily  . sodium chloride flush  3 mL Intravenous Q12H  . spironolactone  25 mg Oral BID   Continuous Infusions: . sodium chloride 10 mL/hr at 09/26/15 0737     Time spent: 25 min    [facility]  [facility] Pager [phone]. If 7PM-7AM, please contact night-coverage at [email or web address], Office  [phone]  password [email or web address] 09/26/2015, 12:52 PM  LOS: 3 days

## 2015-09-26 NOTE — Progress Notes (Addendum)
Physical Therapy Treatment Patient Details Name: Roy Case MRN: [id number] DOB: 1965/09/06 Today's Date: 09/26/2015    History of Present Illness 50 y.o. male with medical history significant for idiopathic hemachromatosis managed with phlebotomies, liver cirrhosis, chronic respiratory failure with supplemental oxygen requirement of 4-5 L/m, and pleural effusions with Pleurx catheter in the right chest presented to the ED with syncope and collapse related to dehydration,fever at home of 100.5.Paracentesis on 7/10, with 3 L, no evidence of SBP, has Pleurx cath, 500 mL drained on 7/10    PT Comments    Patient progressing with independence with transfers and ambulation, though remains limited due to hypoxia with mobility even on NRB.  Will continue skilled PT during acute stay and likely to benefit from STSNF prior to d/c home due to not having 24 hour assist available.   Follow Up Recommendations  Supervision/Assistance - 24 hour;SNF     Equipment Recommendations  Rolling walker with 5" wheels    Recommendations for Other Services       Precautions / Restrictions Precautions Precautions: Fall Precaution Comments: O2 dependent, hypoxic with ambulation    Mobility  Bed Mobility Overal bed mobility: Modified Independent                Transfers     Transfers: Sit to/from Stand Sit to Stand: Supervision         General transfer comment: assist for O2, telemetry  Ambulation/Gait Ambulation/Gait assistance: Supervision Ambulation Distance (Feet): 90 Feet Assistive device: Rolling walker (2 wheeled) Gait Pattern/deviations: Step-through pattern;Decreased stride length Gait velocity: very slow   General Gait Details: on 100% NRB with SpO2 drop to 77% both with supine to sit taking up to 2 minutes to return to 92%, and with ambulation taking less time to recover once seated.  Did relate some symptoms of hypoxia once in the room, but improved from  yesterday   Stairs            Wheelchair Mobility    Modified Rankin (Stroke Patients Only)       Balance Overall balance assessment: Needs assistance         Standing balance support: Bilateral upper extremity supported Standing balance-Leahy Scale: Poor Standing balance comment: continues to need UE support for balance                    Cognition Arousal/Alertness: Awake/alert Behavior During Therapy: WFL for tasks assessed/performed Overall Cognitive Status: Within Functional Limits for tasks assessed                      Exercises      General Comments General comments (skin integrity, edema, etc.): walker adjusted for height this session. cousin and friend in to visit at end of session      Pertinent Vitals/Pain Pain Assessment: Faces Faces Pain Scale: No hurt    Home Living                      Prior Function            PT Goals (current goals can now be found in the care plan section) Progress towards PT goals: Progressing toward goals    Frequency  Min 3X/week    PT Plan Current plan remains appropriate    Co-evaluation             End of Session Equipment Utilized During Treatment: Gait belt;Oxygen Activity Tolerance: Patient limited by fatigue (  continued hypoxemia with ambulation on 100% NRB) Patient left: in chair;with call bell/phone within reach;with family/visitor present     Time: 1407-1430 PT Time Calculation (min) (ACUTE ONLY): 23 min  Charges:  $Gait Training: 23-37 mins                    G Codes:      Roy Case 09/26/2015, 5:25 PM  Roy Case, [place] CarolinaPT [phone] 09/26/2015

## 2015-09-26 NOTE — Care Management Note (Signed)
Case Management Note  Patient Details  Name: Roy Case L Suen MRN: [id number] Date of Birth: February 03, 1966  Subjective/Objective:    Pt lives alone, and per cousin who is visiting, he has not been managing well @ home PTA, and there is no one who can provide 24 hr assistance.  Requests social work involvement for short-term placement for rehab, as pt will not be safe to return home.                         Expected Discharge Plan:  Skilled Nursing Facility  In-House Referral:  Clinical Social Work  Discharge planning Services  CM Consult  Status of Service:  In process, will continue to follow  Magdalene RiverMayo, Aquila Delaughter T, RN 09/26/2015, 3:17 PM

## 2015-09-26 NOTE — Progress Notes (Signed)
Echocardiogram 2D Echocardiogram has been performed.  Roy Case, Roy Case M 09/26/2015, 9:23 AM

## 2015-09-27 LAB — CBC
HCT: 33.2 % — ABNORMAL LOW (ref 39.0–52.0)
HEMOGLOBIN: 11.8 g/dL — AB (ref 13.0–17.0)
MCH: 37.7 pg — ABNORMAL HIGH (ref 26.0–34.0)
MCHC: 35.5 g/dL (ref 30.0–36.0)
MCV: 106.1 fL — ABNORMAL HIGH (ref 78.0–100.0)
PLATELETS: 61 10*3/uL — AB (ref 150–400)
RBC: 3.13 MIL/uL — AB (ref 4.22–5.81)
RDW: 15.2 % (ref 11.5–15.5)
WBC: 4.7 10*3/uL (ref 4.0–10.5)

## 2015-09-27 LAB — BASIC METABOLIC PANEL
Anion gap: 4 — ABNORMAL LOW (ref 5–15)
BUN: 7 mg/dL (ref 6–20)
CHLORIDE: 104 mmol/L (ref 101–111)
CO2: 26 mmol/L (ref 22–32)
CREATININE: 0.63 mg/dL (ref 0.61–1.24)
Calcium: 7.5 mg/dL — ABNORMAL LOW (ref 8.9–10.3)
Glucose, Bld: 99 mg/dL (ref 65–99)
POTASSIUM: 3.8 mmol/L (ref 3.5–5.1)
SODIUM: 134 mmol/L — AB (ref 135–145)

## 2015-09-27 MED ORDER — SULFAMETHOXAZOLE-TRIMETHOPRIM 400-80 MG PO TABS
1.0000 | ORAL_TABLET | Freq: Two times a day (BID) | ORAL | Status: DC
  Administered 2015-09-27 – 2015-10-01 (×9): 1 via ORAL
  Filled 2015-09-27 (×9): qty 1

## 2015-09-27 NOTE — Clinical Social Work Note (Signed)
Clinical Social Work Assessment  Patient Details  Name: Roy Case L Holohan MRN: [id number] Date of Birth: Sep 24, 1965  Date of referral:  09/27/15               Reason for consult:  Facility Placement                Permission sought to share information with:  Oceanographeracility Contact Representative Permission granted to share information::  Yes, Verbal Permission Granted  Name::     Surveyor, quantityAaron  Agency::  SNFs  Relationship::  POAs  Contact Information:     Housing/Transportation Living arrangements for the past 2 months:  Single Family Home Source of Information:  Patient Patient Interpreter Needed:  None Criminal Activity/Legal Involvement Pertinent to Current Situation/Hospitalization:  No - Comment as needed Significant Relationships:  Other Family Members, Friend Lives with:  Self Do you feel safe going back to the place where you live?  No Need for family participation in patient care:  No (Coment)  Care giving concerns:  Pt lives at home alone and does not have good support system   Office managerocial Worker assessment / plan:  CSW spoke with pt in regards to PT and MD recommendation for SNF stay to improve mobility/continue medical care.  CSW explained SNF and referral process.  Employment status:  Disabled (Comment on whether or not currently receiving Disability) Insurance information:  Managed Care PT Recommendations:  Skilled Nursing Facility Information / Referral to community resources:  Skilled Nursing Facility  Patient/Family's Response to care: pt is agreeable to short term SNF stay with goal of returning home.  Patient/Family's Understanding of and Emotional Response to Diagnosis, Current Treatment, and Prognosis:  Pt had not questions or concerns with current medical care but expressed some concern about long term prognosis- is hopeful he would be able to return home soon.  Emotional Assessment Appearance:  Appears stated age Attitude/Demeanor/Rapport:    Affect (typically observed):   Appropriate Orientation:  Oriented to Situation, Oriented to  Time, Oriented to Place, Oriented to Self Alcohol / Substance use:  Not Applicable Psych involvement (Current and /or in the community):  No (Comment)  Discharge Needs  Concerns to be addressed:  Home Safety Concerns, Care Coordination Readmission within the last 30 days:  Yes Current discharge risk:  Physical Impairment, Chronically ill Barriers to Discharge:  Continued Medical Work up   Izora RibasHoloman, Leimomi Zervas M, LCSW 09/27/2015, 4:50 PM

## 2015-09-27 NOTE — Progress Notes (Signed)
Pt already has Living will paperwork hard copy in chart. Pt wants to change healthcare power of attorney - paperwork filled out in the room. Order for Spiritual care to come in am and notarize document.

## 2015-09-27 NOTE — Progress Notes (Signed)
Patient experiencing sharp, stabbing pain at pleurx drain site. NP notified. Recent chest xray done. Pain medication adjusted. OK with NP to drain pleurx catheter. 200cc removed.  M.Sharion Grieves, RN

## 2015-09-27 NOTE — Progress Notes (Addendum)
[facility]  PROGRESS NOTE  Roy Case ZOX:[id number] DOB: [birth year] DOA: 09/23/2015 PCP: Jeanine Luz, FNP    Brief HPI:   Active Problems:   Chronic respiratory failure with hypoxia (HCC)   Hepatic cirrhosis (HCC)   Thrombocytopenia (HCC)   Pleural effusion associated with hepatic disorder   Syncope and collapse   Assessment/Plan: 1. Syncope and collapse- likely from dehydration and volume depletion from viral gastroenteritis. It is ordered this time. Troponin negative. Echo showed EF 60-65%, grade 1 diastolic dysfunction. Will restart Lasix 20 as patient now has stable blood pressure. Continue with Aldactone. 2. Diarrhea- resolved, likely from viral gastroenteritis. C. difficile is negative. GI pathogen panel is pending 3. Liver cirrhosis with ascites- stable, last paracentesis 6 weeks ago. On 08/26/2015 drained 2 L. No evidence of SBP. Flagyl and Azactam discontinued. Lasix restarted today. 4. Chronic respiratory failure with hypoxia- patient hypoxic with 88% on 5 L nasal cannula. Doesn't appear short of breath, chest x-ray is clear.  5. 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 Drained 500 mL on 09/24/2015. Patient would drain once a week every Wednesday 6. 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 7. Hypokalemia- replete, magnesium 1.9 8. UTI- UA is positive for nitrite, will start bactrim DS 1 tab po bid, obtain urine cultures.   DVT prophylaxis: SCD Code Status: Full code Family Communication: *No family at bedside Disposition Plan: Home with HHPT   Consultants:  None   Procedures:  None   Antibiotics:  Anti-infectives    Start   Dose/Rate Route Frequency Ordered Stop   09/23/15 2300  aztreonam (AZACTAM) 1 g in dextrose 5 % 50 mL IVPB Status: Discontinued    1 g 100 mL/hr over 30 Minutes Intravenous Every 8 hours 09/23/15 2236 09/25/15  0718   09/23/15 2300  metroNIDAZOLE (FLAGYL) IVPB 500 mg Status: Discontinued    500 mg 100 mL/hr over 60 Minutes Intravenous Every 8 hours 09/23/15 2236 09/25/15 0718   09/23/15 1800  levofloxacin (LEVAQUIN) IVPB 750 mg Status: Discontinued    750 mg 100 mL/hr over 90 Minutes Intravenous Every 24 hours 09/23/15 1749 09/23/15 2223           Subjective: Patient seen and examined, breathing is better, CXR shows small pleural effusions. Complains of left sided checst pain on deep breathing at the site of Pleurx catheter.  Objective: Filed Vitals:   09/27/15 0327 09/27/15 0600 09/27/15 0800 09/27/15 0910  BP: 114/71 115/81 127/78   Pulse:      Temp: 98.3 F (36.8 C)  98.3 F (36.8 C)   TempSrc: Oral  Oral   Resp: Height:      Weight:      SpO2: 93% 93% 80% 93%    Intake/Output Summary (Last 24 hours) at 09/27/15 1202 Last data filed at 09/27/15 1009  Gross per 24 hour  Intake    790 ml  Output    400 ml  Net    390 ml   Filed Weights   09/23/15 1422 09/24/15 0635  Weight: 78.019 kg (172 lb) 81.2 kg (179 lb 0.2 oz)    Examination:  General exam: Appears calm and comfortable  Respiratory system: Clear to auscultation. Respiratory effort normal. Cardiovascular system: S1 & S2 heard, RRR. No JVD, murmurs, rubs, gallops or clicks. Bilateral 1+ pedal edema. Gastrointestinal system: Abdomen is nondistended, soft and nontender. No organomegaly or masses felt. Normal bowel sounds heard. Central nervous  system: Alert and oriented. No focal neurological deficits. Extremities: Symmetric 5 x 5 power. Bilateral Skin: No rashes, lesions or ulcers Psychiatry: Judgement and insight appear normal. Mood & affect appropriate.    Data Reviewed: I have personally reviewed following labs and imaging studies Basic Metabolic Panel:  Recent Labs Lab 09/23/15 1824 09/24/15 0512 09/24/15 1113 09/25/15 0556 09/26/15 0402 09/27/15 0317  NA  --   138 140 135 135 134*  K  --  3.2* 3.5 3.4* 3.7 3.8  CL  --  108 109 107 106 104  CO2  --  25 27 24 25 26   GLUCOSE  --  87 94 99 99 99  BUN  --  13 11 8 6 7   CREATININE  --  0.79 0.73 0.66 0.59* 0.63  CALCIUM  --  7.7* 8.0* 7.5* 7.5* 7.5*  MG 1.8  --   --  1.9  --   --   PHOS 3.3  --   --   --   --   --    Liver Function Tests:  Recent Labs Lab 09/23/15 1435 09/24/15 0512  AST 64* 53*  ALT 32 28  ALKPHOS 71 55  BILITOT 2.6* 2.9*  PROT 6.2* 5.6*  ALBUMIN 2.3* 1.9*    Recent Labs Lab 09/23/15 1435  LIPASE 36  CBC:  Recent Labs Lab 09/23/15 1551 09/24/15 0512 09/24/15 1113 09/26/15 0402 09/27/15 0317  WBC 4.9 3.2* 4.0 5.0 4.7  HGB 12.5* 11.6* 12.6* 11.5* 11.8*  HCT 36.2* 34.1* 36.5* 32.4* 33.2*  MCV 106.5* 106.9* 107.0* 105.5* 106.1*  PLT 69* 58* 62* 62* 61*   Cardiac Enzymes:  Recent Labs Lab 09/23/15 1824 09/23/15 2318 09/24/15 0512  TROPONINI <0.03 <0.03 <0.03   BNP (last 3 results)  Recent Labs  08/02/15 2124  BNP 25.5    ProBNP (last 3 results) No results for input(s): PROBNP in the last 8760 hours.  CBG: No results for input(s): GLUCAP in the last 168 hours.  Recent Results (from the past 240 hour(s))  MRSA PCR Screening     Status: None   Collection Time: 09/23/15  7:53 PM  Result Value Ref Range Status   MRSA by PCR NEGATIVE NEGATIVE Final    Comment:        The GeneXpert MRSA Assay (FDA approved for NASAL specimens only), is one component of a comprehensive MRSA colonization surveillance program. It is not intended to diagnose MRSA infection nor to guide or monitor treatment for MRSA infections.   Culture, body fluid-bottle     Status: None (Preliminary result)   Collection Time: 09/24/15 10:17 AM  Result Value Ref Range Status   Specimen Description FLUID PERITONEAL  Final   Special Requests NONE  Final   Culture NO GROWTH 2 DAYS  Final   Report Status PENDING  Incomplete  Gram stain     Status: None   Collection Time:  09/24/15 10:17 AM  Result Value Ref Range Status   Specimen Description FLUID PERITONEAL  Final   Special Requests NONE  Final   Gram Stain   Final    MODERATE WBC PRESENT,BOTH PMN AND MONONUCLEAR NO ORGANISMS SEEN    Report Status 09/24/2015 FINAL  Final  C difficile quick scan w PCR reflex     Status: None   Collection Time: 09/25/15 10:23 AM  Result Value Ref Range Status   C Diff antigen NEGATIVE NEGATIVE Final   C Diff toxin NEGATIVE NEGATIVE Final   C Diff interpretation  No C. difficile detected.  Final  Gastrointestinal Panel by PCR , Stool     Status: None   Collection Time: 09/25/15  5:49 PM  Result Value Ref Range Status   Campylobacter species NOT DETECTED NOT DETECTED Final   Plesimonas shigelloides NOT DETECTED NOT DETECTED Final   Salmonella species NOT DETECTED NOT DETECTED Final   Yersinia enterocolitica NOT DETECTED NOT DETECTED Final   Vibrio species NOT DETECTED NOT DETECTED Final   Vibrio cholerae NOT DETECTED NOT DETECTED Final   Enteroaggregative E coli (EAEC) NOT DETECTED NOT DETECTED Final   Enteropathogenic E coli (EPEC) NOT DETECTED NOT DETECTED Final   Enterotoxigenic E coli (ETEC) NOT DETECTED NOT DETECTED Final   Shiga like toxin producing E coli (STEC) NOT DETECTED NOT DETECTED Final   E. coli O157 NOT DETECTED NOT DETECTED Final   Shigella/Enteroinvasive E coli (EIEC) NOT DETECTED NOT DETECTED Final   Cryptosporidium NOT DETECTED NOT DETECTED Final   Cyclospora cayetanensis NOT DETECTED NOT DETECTED Final   Entamoeba histolytica NOT DETECTED NOT DETECTED Final   Giardia lamblia NOT DETECTED NOT DETECTED Final   Adenovirus F40/41 NOT DETECTED NOT DETECTED Final   Astrovirus NOT DETECTED NOT DETECTED Final   Norovirus GI/GII NOT DETECTED NOT DETECTED Final   Rotavirus A NOT DETECTED NOT DETECTED Final   Sapovirus (I, II, IV, and V) NOT DETECTED NOT DETECTED Final     Studies: Dg Chest 2 View  09/25/2015  CLINICAL DATA:  Fever and sob x 2 days  EXAM: CHEST - 2 VIEW COMPARISON:  09/24/2015 FINDINGS: Improved aeration. Left retrocardiac consolidation/ atelectasis, decreased since previous. Right tunneled pleural drain catheter stable. Heart size and mediastinal contours are within normal limits. Blunting of posterior costophrenic angles suggesting small pleural effusions. No pneumothorax. Visualized bones unremarkable. IMPRESSION: 1. Small bilateral pleural effusions, with stable position of right pleural drain catheter. 2. Improved aeration with mild residual left retrocardiac consolidation/atelectasis. Electronically Signed   By: Corlis Leak M.D.   On: 09/25/2015 14:37   Dg Chest Port 1 View  09/26/2015  CLINICAL DATA:  Right-sided chest wall pain. EXAM: PORTABLE CHEST 1 VIEW COMPARISON:  09/25/2015 FINDINGS: Stable appearance left base atelectasis with probable small left pleural effusion. The right pleural drain is again noted. No focal consolidation in the right lung. No evidence for pneumothorax. Heart size is stable. Telemetry leads overlie the chest. IMPRESSION: Stable exam.  No new or progressive findings. Electronically Signed   By: Kennith Center M.D.   On: 09/26/2015 18:32    Scheduled Meds: . fluticasone furoate-vilanterol  1 puff Inhalation Daily  . furosemide  40 mg Oral BID  . pantoprazole  40 mg Oral Daily  . sodium chloride flush  3 mL Intravenous Q12H  . spironolactone  25 mg Oral BID  . sulfamethoxazole-trimethoprim  1 tablet Oral Q12H   Continuous Infusions: . sodium chloride 10 mL/hr at 09/26/15 0737     Time spent: 25 min    [facility]  [facility] Pager [phone]. If 7PM-7AM, please contact night-coverage at [email or web address], Office  [phone]  password [email or web address] 09/27/2015, 12:02 PM  LOS: 4 days

## 2015-09-27 NOTE — [place] FL2 (Signed)
[place] MEDICAID FL2 LEVEL OF CARE SCREENING TOOL     IDENTIFICATION  Patient Name: Roy Case Birthdate: [birth year] Sex: male Admission Date (Current Location): 09/23/2015  [facility] [place]  [employer]   Facility and Address:  [facility] [place], [address], [place], [place] [address]      Provider Number: [id number]  Attending Physician Name and Address:  Meredeth IdeGagan S Lama, MD  Relative Name and Phone Number:       Current Level of Care: Hospital Recommended Level of Care: Skilled Nursing Facility Prior Approval Number:    Date Approved/Denied:   PASRR Number: [id number]  Discharge Plan: SNF    Current Diagnoses: Patient Active Problem List   Diagnosis Date Noted  . Syncope and collapse 09/23/2015  . Orthostatic syncope   . Abdominal pain, generalized   . Lactic acid increased   . Thrombocytopenia (HCC) 08/18/2015  . Cough with hemoptysis 08/18/2015  . Pleural effusion associated with hepatic disorder 08/18/2015  . Lower extremity edema 08/15/2015  . Numbness and tingling 07/27/2015  . 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 CAP (community acquired pneumonia) 05/03/2015  . Acute sinus infection 01/25/2015  . Chronic respiratory failure with hypoxia (HCC) 12/28/2014  . Hepatic cirrhosis (HCC) 12/28/2014    Orientation RESPIRATION BLADDER Height & Weight     Self, Time, Situation, Place  O2 (6L [place]) Continent Weight: 81.2 kg (179 lb 0.2 oz) Height:  5\' 9"  (175.3 cm)  BEHAVIORAL SYMPTOMS/MOOD NEUROLOGICAL BOWEL NUTRITION STATUS      Continent Diet (cardiac)  AMBULATORY STATUS COMMUNICATION OF NEEDS Skin   Limited Assist Verbally Normal                       Personal Care Assistance Level of Assistance  Bathing, Dressing Bathing Assistance: Limited assistance   Dressing Assistance: Limited assistance     Functional  Limitations Info             SPECIAL CARE FACTORS FREQUENCY  PT (By licensed PT), OT (By licensed OT)     PT Frequency: 5/wk OT Frequency: 5/wk            Contractures      Additional Factors Info  Code Status (pleurex drain- to be drained once a week) Code Status Info: DNR             Current Medications (09/27/2015):  This is the current hospital active medication list Current Facility-Administered Medications  Medication Dose Route Frequency Provider Last Rate Last Dose  . 0.9 %  sodium chloride infusion   Intravenous Continuous Meredeth IdeGagan S Lama, MD 10 mL/hr at 09/26/15 0737    . albuterol (PROVENTIL) (2.5 MG/3ML) 0.083% nebulizer solution 2.5 mg  2.5 mg Nebulization Q4H PRN Richarda OverlieNayana Abrol, MD      . ALPRAZolam Prudy Feeler(XANAX) tablet 0.5 mg  0.5 mg Oral BID PRN Richarda OverlieNayana Abrol, MD   0.5 mg at 09/26/15 2113  . fluticasone furoate-vilanterol (BREO ELLIPTA) 100-25 MCG/INH 1 puff  1 puff Inhalation Daily Richarda OverlieNayana Abrol, MD   1 puff at 09/27/15 0910  . furosemide (LASIX) tablet 40 mg  40 mg Oral BID Meredeth IdeGagan S Lama, MD   40 mg at 09/27/15 0823  . levalbuterol (XOPENEX) nebulizer solution 0.63 mg  0.63 mg Nebulization Q6H PRN Richarda OverlieNayana Abrol, MD      . morphine 2 MG/ML injection 2 mg  2 mg Intravenous Q3H PRN Leda Gauze, NP   2 mg at 09/27/15 0824  . ondansetron (ZOFRAN) tablet 4 mg  4 mg Oral Q6H PRN Richarda Overlie, MD       Or  . ondansetron (ZOFRAN) injection 4 mg  4 mg Intravenous Q6H PRN Richarda Overlie, MD      . oxyCODONE (Oxy IR/ROXICODONE) immediate release tablet 5 mg  5 mg Oral Q4H PRN Leda Gauze, NP   5 mg at 09/27/15 1353  . pantoprazole (PROTONIX) EC tablet 40 mg  40 mg Oral Daily Richarda Overlie, MD   40 mg at 09/27/15 1011  . sodium chloride flush (NS) 0.9 % injection 3 mL  3 mL Intravenous Q12H Richarda Overlie, MD   3 mL at 09/27/15 1011  . spironolactone (ALDACTONE) tablet 25 mg  25 mg Oral BID Richarda Overlie, MD   25 mg at 09/27/15 0823  . sulfamethoxazole-trimethoprim  (BACTRIM,SEPTRA) 400-80 MG per tablet 1 tablet  1 tablet Oral Q12H Meredeth Ide, MD   1 tablet at 09/27/15 1011     Discharge Medications: Please see discharge summary for a list of discharge medications.  Relevant Imaging Results:  Relevant Lab Results:   Additional Information SS#: [id number], will DC with pleurex drain   Holoman, Binnie Rail, LCSW

## 2015-09-28 LAB — BASIC METABOLIC PANEL
ANION GAP: 7 (ref 5–15)
BUN: 7 mg/dL (ref 6–20)
CALCIUM: 7.5 mg/dL — AB (ref 8.9–10.3)
CO2: 23 mmol/L (ref 22–32)
CREATININE: 0.7 mg/dL (ref 0.61–1.24)
Chloride: 102 mmol/L (ref 101–111)
GLUCOSE: 92 mg/dL (ref 65–99)
Potassium: 3.9 mmol/L (ref 3.5–5.1)
SODIUM: 132 mmol/L — AB (ref 135–145)

## 2015-09-28 NOTE — Progress Notes (Signed)
[facility]  PROGRESS NOTE  Roy Case MWN:[id number] DOB: Feb 06, 1966 DOA: 09/23/2015 PCP: Jeanine Luz, FNP    Brief HPI:   Active Problems:   Chronic respiratory failure with hypoxia (HCC)   Hepatic cirrhosis (HCC)   Thrombocytopenia (HCC)   Pleural effusion associated with hepatic disorder   Syncope and collapse   Assessment/Plan: 1. Syncope and collapse- likely from dehydration and volume depletion from viral gastroenteritis. Troponin negative. Echo showed EF 60-65%, grade 1 diastolic dysfunction. Will restart Lasix 40 mg twice daily ,as patient now has stable blood pressure. Continue with Aldactone. 2. Diarrhea- resolved, likely from viral gastroenteritis. C. difficile is negative. GI pathogen panel is pending. 3. Liver cirrhosis with ascites- stable, last paracentesis 6 weeks ago. On 08/26/2015 drained 2 L. No evidence of SBP. Flagyl and Azactam discontinued. Lasix restarted today. 4. Chronic respiratory failure with hypoxia- patient hypoxic with 88% on 5 L nasal cannula. Doesn't appear short of breath, chest x-ray is clear.  5. 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 Drained 500 mL on 09/24/2015. Patient would drain once a week every Wednesday 6. 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 7. Hypokalemia- replete, magnesium 1.9 8. UTI- UA is positive for nitrite, will start bactrim DS 1 tab po bid, obtain urine cultures.   DVT prophylaxis: SCD Code Status: Full code Family Communication: *No family at bedside Disposition Plan: Skilled nursing facility when stable   Consultants:  None   Procedures:  None   Antibiotics:  Anti-infectives    Start   Dose/Rate Route Frequency Ordered Stop   09/23/15 2300  aztreonam (AZACTAM) 1 g in dextrose 5 % 50 mL IVPB Status: Discontinued    1 g 100 mL/hr over 30 Minutes Intravenous Every 8 hours 09/23/15  2236 09/25/15 0718   09/23/15 2300  metroNIDAZOLE (FLAGYL) IVPB 500 mg Status: Discontinued    500 mg 100 mL/hr over 60 Minutes Intravenous Every 8 hours 09/23/15 2236 09/25/15 0718   09/23/15 1800  levofloxacin (LEVAQUIN) IVPB 750 mg Status: Discontinued    750 mg 100 mL/hr over 90 Minutes Intravenous Every 24 hours 09/23/15 1749 09/23/15 2223           Subjective: Patient seen and examined, breathing is better, CXR shows small pleural effusions. Started on Bactrim for abnormal UA. Urine culture obtained.  Objective: Filed Vitals:   09/28/15 0400 09/28/15 0600 09/28/15 0836 09/28/15 0839  BP: 126/78 102/70 141/87   Pulse:      Temp: 98.6 F (37 C)  99 F (37.2 C)   TempSrc: Oral  Oral   Resp: 13 15 15    Height:      Weight:      SpO2: 92%  92% 100%    Intake/Output Summary (Last 24 hours) at 09/28/15 1112 Last data filed at 09/27/15 2029  Gross per 24 hour  Intake    180 ml  Output    400 ml  Net   -220 ml   Filed Weights   09/23/15 1422 09/24/15 0635  Weight: 78.019 kg (172 lb) 81.2 kg (179 lb 0.2 oz)    Examination:  General exam: Appears calm and comfortable  Respiratory system: Clear to auscultation. Respiratory effort normal. Cardiovascular system: S1 & S2 heard, RRR. No JVD, murmurs, rubs, gallops or clicks. Bilateral 1+ pedal edema. Gastrointestinal system: Abdomen is nondistended, soft and nontender. No organomegaly or masses felt. Normal bowel sounds heard. Central nervous system: Alert and oriented. No focal neurological  deficits. Extremities: Symmetric 5 x 5 power. Bilateral Skin: No rashes, lesions or ulcers Psychiatry: Judgement and insight appear normal. Mood & affect appropriate.    Data Reviewed: I have personally reviewed following labs and imaging studies Basic Metabolic Panel:  Recent Labs Lab 09/23/15 1824  09/24/15 1113 09/25/15 0556 09/26/15 0402 09/27/15 0317 09/28/15 0320  NA  --   < > 140 135 135  134* 132*  K  --   < > 3.5 3.4* 3.7 3.8 3.9  CL  --   < > 109 107 106 104 102  CO2  --   < > 27 24 25 26 23   GLUCOSE  --   < > 94 99 99 99 92  BUN  --   < > 11 8 6 7 7   CREATININE  --   < > 0.73 0.66 0.59* 0.63 0.70  CALCIUM  --   < > 8.0* 7.5* 7.5* 7.5* 7.5*  MG 1.8  --   --  1.9  --   --   --   PHOS 3.3  --   --   --   --   --   --   < > = values in this interval not displayed. Liver Function Tests:  Recent Labs Lab 09/23/15 1435 09/24/15 0512  AST 64* 53*  ALT 32 28  ALKPHOS 71 55  BILITOT 2.6* 2.9*  PROT 6.2* 5.6*  ALBUMIN 2.3* 1.9*    Recent Labs Lab 09/23/15 1435  LIPASE 36  CBC:  Recent Labs Lab 09/23/15 1551 09/24/15 0512 09/24/15 1113 09/26/15 0402 09/27/15 0317  WBC 4.9 3.2* 4.0 5.0 4.7  HGB 12.5* 11.6* 12.6* 11.5* 11.8*  HCT 36.2* 34.1* 36.5* 32.4* 33.2*  MCV 106.5* 106.9* 107.0* 105.5* 106.1*  PLT 69* 58* 62* 62* 61*   Cardiac Enzymes:  Recent Labs Lab 09/23/15 1824 09/23/15 2318 09/24/15 0512  TROPONINI <0.03 <0.03 <0.03   BNP (last 3 results)  Recent Labs  08/02/15 2124  BNP 25.5    ProBNP (last 3 results) No results for input(s): PROBNP in the last 8760 hours.  CBG: No results for input(s): GLUCAP in the last 168 hours.  Recent Results (from the past 240 hour(s))  MRSA PCR Screening     Status: None   Collection Time: 09/23/15  7:53 PM  Result Value Ref Range Status   MRSA by PCR NEGATIVE NEGATIVE Final    Comment:        The GeneXpert MRSA Assay (FDA approved for NASAL specimens only), is one component of a comprehensive MRSA colonization surveillance program. It is not intended to diagnose MRSA infection nor to guide or monitor treatment for MRSA infections.   Culture, body fluid-bottle     Status: None (Preliminary result)   Collection Time: 09/24/15 10:17 AM  Result Value Ref Range Status   Specimen Description FLUID PERITONEAL  Final   Special Requests NONE  Final   Culture NO GROWTH 3 DAYS  Final   Report  Status PENDING  Incomplete  Gram stain     Status: None   Collection Time: 09/24/15 10:17 AM  Result Value Ref Range Status   Specimen Description FLUID PERITONEAL  Final   Special Requests NONE  Final   Gram Stain   Final    MODERATE WBC PRESENT,BOTH PMN AND MONONUCLEAR NO ORGANISMS SEEN    Report Status 09/24/2015 FINAL  Final  C difficile quick scan w PCR reflex     Status: None  Collection Time: 09/25/15 10:23 AM  Result Value Ref Range Status   C Diff antigen NEGATIVE NEGATIVE Final   C Diff toxin NEGATIVE NEGATIVE Final   C Diff interpretation No C. difficile detected.  Final  Gastrointestinal Panel by PCR , Stool     Status: None   Collection Time: 09/25/15  5:49 PM  Result Value Ref Range Status   Campylobacter species NOT DETECTED NOT DETECTED Final   Plesimonas shigelloides NOT DETECTED NOT DETECTED Final   Salmonella species NOT DETECTED NOT DETECTED Final   Yersinia enterocolitica NOT DETECTED NOT DETECTED Final   Vibrio species NOT DETECTED NOT DETECTED Final   Vibrio cholerae NOT DETECTED NOT DETECTED Final   Enteroaggregative E coli (EAEC) NOT DETECTED NOT DETECTED Final   Enteropathogenic E coli (EPEC) NOT DETECTED NOT DETECTED Final   Enterotoxigenic E coli (ETEC) NOT DETECTED NOT DETECTED Final   Shiga like toxin producing E coli (STEC) NOT DETECTED NOT DETECTED Final   E. coli O157 NOT DETECTED NOT DETECTED Final   Shigella/Enteroinvasive E coli (EIEC) NOT DETECTED NOT DETECTED Final   Cryptosporidium NOT DETECTED NOT DETECTED Final   Cyclospora cayetanensis NOT DETECTED NOT DETECTED Final   Entamoeba histolytica NOT DETECTED NOT DETECTED Final   Giardia lamblia NOT DETECTED NOT DETECTED Final   Adenovirus F40/41 NOT DETECTED NOT DETECTED Final   Astrovirus NOT DETECTED NOT DETECTED Final   Norovirus GI/GII NOT DETECTED NOT DETECTED Final   Rotavirus A NOT DETECTED NOT DETECTED Final   Sapovirus (I, II, IV, and V) NOT DETECTED NOT DETECTED Final      Studies: Dg Chest Port 1 View  09/26/2015  CLINICAL DATA:  Right-sided chest wall pain. EXAM: PORTABLE CHEST 1 VIEW COMPARISON:  09/25/2015 FINDINGS: Stable appearance left base atelectasis with probable small left pleural effusion. The right pleural drain is again noted. No focal consolidation in the right lung. No evidence for pneumothorax. Heart size is stable. Telemetry leads overlie the chest. IMPRESSION: Stable exam.  No new or progressive findings. Electronically Signed   By: Kennith Center M.D.   On: 09/26/2015 18:32    Scheduled Meds: . fluticasone furoate-vilanterol  1 puff Inhalation Daily  . furosemide  40 mg Oral BID  . pantoprazole  40 mg Oral Daily  . sodium chloride flush  3 mL Intravenous Q12H  . spironolactone  25 mg Oral BID  . sulfamethoxazole-trimethoprim  1 tablet Oral Q12H   Continuous Infusions: . sodium chloride 10 mL/hr at 09/26/15 0737     Time spent: 25 min    [facility]  [facility] Pager [phone]. If 7PM-7AM, please contact night-coverage at [email or web address], Office  [phone]  password [email or web address] 09/28/2015, 11:12 AM  LOS: 5 days

## 2015-09-28 NOTE — Progress Notes (Signed)
   09/28/15 1300  Clinical Encounter Type  Visited With Patient  Visit Type Follow-up  Referral From Nurse  Consult/Referral To Chaplain  Spiritual Encounters  Spiritual Needs Literature  Stress Factors  Patient Stress Factors Exhausted;Health changes  Family Stress Factors None identified  Advance Directives (For Healthcare)  Does patient have an advance directive? Yes  Would patient like information on creating an advanced directive? No - patient declined information  Type of Advance Directive Healthcare Power of Attorney  Does patient want to make changes to advanced directive? Yes - information given  Copy of advanced directive(s) in chart? Yes   Chaplain responded to consult for AD. Patient already [place], [place] and [place].    Rosezella FloridaLisa M Rollan Roger 09/28/2015 1:58 PM

## 2015-09-28 NOTE — Progress Notes (Signed)
CSW provided pt with bed offers- [facility]  CSW confirmed facility is able to accept- they have started insurance auth- will likely get auth on Monday.  CSW will continue to follow  Merlyn LotJenna Holoman, [facility]WA Clinical Social Worker [phone]

## 2015-09-28 NOTE — Consult Note (Signed)
CH spoke with RN informing that Notary not yet available and once available will arrange to have notary for pt HCPOA

## 2015-09-29 LAB — URINE CULTURE

## 2015-09-29 LAB — CULTURE, BODY FLUID-BOTTLE: CULTURE: NO GROWTH

## 2015-09-29 LAB — CULTURE, BODY FLUID W GRAM STAIN -BOTTLE

## 2015-09-29 NOTE — Progress Notes (Signed)
RT took pt of partial re-breather and placed on 10L HFNC per pt's request. Pt states the mask is not comfortable and is breathing better with HFNC. RT will continue to monitor and wean as tolerated.

## 2015-09-29 NOTE — Progress Notes (Signed)
Pt POA visiting reports he would [facility]; where  he has a "good friend"  that he had contacted the administrator Molli HazardKelly Norris who promised to hold a bed for the patient and requesting a social worker to call her. CSW Derenda FennelBashira Nixon called and made aware.

## 2015-09-29 NOTE — Progress Notes (Signed)
Pt C/O discomfort and requested pleurx drain. Dr Sharl MaLama paged 09/28/2015 and OK to perform . Removed 500 ml, serous. yellow fluid.  Pt tolerated and verbalized relief.

## 2015-09-29 NOTE — Progress Notes (Signed)
[facility]  PROGRESS NOTE  Roy Case [id number] DOB: September 18, 1965 DOA: 09/23/2015 PCP: Jeanine Luzalone, Gregory, FNP    Brief HPI:   Active Problems:   Chronic respiratory failure with hypoxia (HCC)   Hepatic cirrhosis (HCC)   Thrombocytopenia (HCC)   Pleural effusion associated with hepatic disorder   Syncope and collapse   Assessment/Plan: 1. Syncope and collapse- likely from dehydration and volume depletion from viral gastroenteritis. Troponin negative. Echo showed EF 60-65%, grade 1 diastolic dysfunction. Restarted Lasix 40 mg twice daily ,as patient now has stable blood pressure. Continue with Aldactone. 2. Diarrhea- resolved, likely from viral gastroenteritis. C. difficile is negative. GI pathogen panel is negative. 3. Liver cirrhosis with ascites- stable, last paracentesis 6 weeks ago. On 08/26/2015 drained 2 L. No evidence of SBP. Flagyl and Azactam discontinued. Lasix restarted today. 4. Chronic respiratory failure with hypoxia- patient hypoxic with 88% on 5 L nasal cannula. Doesn't appear short of breath, chest x-ray is clear.  5. 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 Drained 500 mL on 09/24/2015. Patient would drain once a week every Wednesday 6. 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 7. Hypokalemia- replete, magnesium 1.9 8. UTI- UA is positive for nitrite, started   bactrim DS 1 tab po bid, urine cultures are showing insignificant growth.   DVT prophylaxis: SCD Code Status: Full code Family Communication: *No family at bedside Disposition Plan: Skilled nursing facility when bed available   Consultants:  None   Procedures:  None   Antibiotics:  Anti-infectives    Start   Dose/Rate Route Frequency Ordered Stop   09/23/15 2300  aztreonam (AZACTAM) 1 g in dextrose 5 % 50 mL IVPB Status: Discontinued    1 g 100 mL/hr over 30 Minutes  Intravenous Every 8 hours 09/23/15 2236 09/25/15 0718   09/23/15 2300  metroNIDAZOLE (FLAGYL) IVPB 500 mg Status: Discontinued    500 mg 100 mL/hr over 60 Minutes Intravenous Every 8 hours 09/23/15 2236 09/25/15 0718   09/23/15 1800  levofloxacin (LEVAQUIN) IVPB 750 mg Status: Discontinued    750 mg 100 mL/hr over 90 Minutes Intravenous Every 24 hours 09/23/15 1749 09/23/15 2223           Subjective: Patient seen and examined, breathing is better, CXR shows small pleural effusions. Started on Bactrim for abnormal UA. Urine culture growing multiple species.  Objective: Filed Vitals:   09/29/15 0248 09/29/15 0454 09/29/15 0800 09/29/15 0841  BP:  118/74 128/75   Pulse:  96 96   Temp:  98.4 F (36.9 C) 98.3 F (36.8 C)   TempSrc:  Oral Oral   Resp: 13  18   Height:      Weight:      SpO2: 94% 97% 91% 99%    Intake/Output Summary (Last 24 hours) at 09/29/15 1207 Last data filed at 09/29/15 0456  Gross per 24 hour  Intake    390 ml  Output   1150 ml  Net   -760 ml   Filed Weights   09/23/15 1422 09/24/15 0635  Weight: 78.019 kg (172 lb) 81.2 kg (179 lb 0.2 oz)    Examination:  General exam: Appears calm and comfortable  Respiratory system: Clear to auscultation. Respiratory effort normal. Cardiovascular system: S1 & S2 heard, RRR. No JVD, murmurs, rubs, gallops or clicks. Bilateral 1+ pedal edema. Gastrointestinal system: Abdomen is nondistended, soft and nontender. No organomegaly or masses felt. Normal bowel sounds heard. Central nervous system: Alert  and oriented. No focal neurological deficits. Extremities: Symmetric 5 x 5 power.  Skin: No rashes, lesions or ulcers Psychiatry: Judgement and insight appear normal. Mood & affect appropriate.    Data Reviewed: I have personally reviewed following labs and imaging studies Basic Metabolic Panel:  Recent Labs Lab 09/23/15 1824  09/24/15 1113 09/25/15 0556 09/26/15 0402  09/27/15 0317 09/28/15 0320  NA  --   < > 140 135 135 134* 132*  K  --   < > 3.5 3.4* 3.7 3.8 3.9  CL  --   < > 109 107 106 104 102  CO2  --   < > GLUCOSE  --   < > 94 99 99 99 92  BUN  --   < > CREATININE  --   < > 0.73 0.66 0.59* 0.63 0.70  CALCIUM  --   < > 8.0* 7.5* 7.5* 7.5* 7.5*  MG 1.8  --   --  1.9  --   --   --   PHOS 3.3  --   --   --   --   --   --   < > = values in this interval not displayed. Liver Function Tests:  Recent Labs Lab 09/23/15 1435 09/24/15 0512  AST 64* 53*  ALT 32 28  ALKPHOS 71 55  BILITOT 2.6* 2.9*  PROT 6.2* 5.6*  ALBUMIN 2.3* 1.9*    Recent Labs Lab 09/23/15 1435  LIPASE 36  CBC:  Recent Labs Lab 09/23/15 1551 09/24/15 0512 09/24/15 1113 09/26/15 0402 09/27/15 0317  WBC 4.9 3.2* 4.0 5.0 4.7  HGB 12.5* 11.6* 12.6* 11.5* 11.8*  HCT 36.2* 34.1* 36.5* 32.4* 33.2*  MCV 106.5* 106.9* 107.0* 105.5* 106.1*  PLT 69* 58* 62* 62* 61*   Cardiac Enzymes:  Recent Labs Lab 09/23/15 1824 09/23/15 2318 09/24/15 0512  TROPONINI <0.03 <0.03 <0.03   BNP (last 3 results)  Recent Labs  08/02/15 2124  BNP 25.5    ProBNP (last 3 results) No results for input(s): PROBNP in the last 8760 hours.  CBG: No results for input(s): GLUCAP in the last 168 hours.  Recent Results (from the past 240 hour(s))  MRSA PCR Screening     Status: None   Collection Time: 09/23/15  7:53 PM  Result Value Ref Range Status   MRSA by PCR NEGATIVE NEGATIVE Final    Comment:        The GeneXpert MRSA Assay (FDA approved for NASAL specimens only), is one component of a comprehensive MRSA colonization surveillance program. It is not intended to diagnose MRSA infection nor to guide or monitor treatment for MRSA infections.   Culture, body fluid-bottle     Status: None (Preliminary result)   Collection Time: 09/24/15 10:17 AM  Result Value Ref Range Status   Specimen Description FLUID PERITONEAL  Final   Special Requests  NONE  Final   Culture NO GROWTH 4 DAYS  Final   Report Status PENDING  Incomplete  Gram stain     Status: None   Collection Time: 09/24/15 10:17 AM  Result Value Ref Range Status   Specimen Description FLUID PERITONEAL  Final   Special Requests NONE  Final   Gram Stain   Final    MODERATE WBC PRESENT,BOTH PMN AND MONONUCLEAR NO ORGANISMS SEEN    Report Status 09/24/2015 FINAL  Final  C difficile quick scan w PCR reflex  Status: None   Collection Time: 09/25/15 10:23 AM  Result Value Ref Range Status   C Diff antigen NEGATIVE NEGATIVE Final   C Diff toxin NEGATIVE NEGATIVE Final   C Diff interpretation No C. difficile detected.  Final  Gastrointestinal Panel by PCR , Stool     Status: None   Collection Time: 09/25/15  5:49 PM  Result Value Ref Range Status   Campylobacter species NOT DETECTED NOT DETECTED Final   Plesimonas shigelloides NOT DETECTED NOT DETECTED Final   Salmonella species NOT DETECTED NOT DETECTED Final   Yersinia enterocolitica NOT DETECTED NOT DETECTED Final   Vibrio species NOT DETECTED NOT DETECTED Final   Vibrio cholerae NOT DETECTED NOT DETECTED Final   Enteroaggregative E coli (EAEC) NOT DETECTED NOT DETECTED Final   Enteropathogenic E coli (EPEC) NOT DETECTED NOT DETECTED Final   Enterotoxigenic E coli (ETEC) NOT DETECTED NOT DETECTED Final   Shiga like toxin producing E coli (STEC) NOT DETECTED NOT DETECTED Final   E. coli O157 NOT DETECTED NOT DETECTED Final   Shigella/Enteroinvasive E coli (EIEC) NOT DETECTED NOT DETECTED Final   Cryptosporidium NOT DETECTED NOT DETECTED Final   Cyclospora cayetanensis NOT DETECTED NOT DETECTED Final   Entamoeba histolytica NOT DETECTED NOT DETECTED Final   Giardia lamblia NOT DETECTED NOT DETECTED Final   Adenovirus F40/41 NOT DETECTED NOT DETECTED Final   Astrovirus NOT DETECTED NOT DETECTED Final   Norovirus GI/GII NOT DETECTED NOT DETECTED Final   Rotavirus A NOT DETECTED NOT DETECTED Final   Sapovirus (I,  II, IV, and V) NOT DETECTED NOT DETECTED Final  Urine culture     Status: Abnormal   Collection Time: 09/28/15  4:07 PM  Result Value Ref Range Status   Specimen Description URINE, CLEAN CATCH  Final   Special Requests NONE  Final   Culture MULTIPLE SPECIES PRESENT, SUGGEST RECOLLECTION (A)  Final   Report Status 09/29/2015 FINAL  Final     Studies: No results found.  Scheduled Meds: . fluticasone furoate-vilanterol  1 puff Inhalation Daily  . furosemide  40 mg Oral BID  . pantoprazole  40 mg Oral Daily  . sodium chloride flush  3 mL Intravenous Q12H  . spironolactone  25 mg Oral BID  . sulfamethoxazole-trimethoprim  1 tablet Oral Q12H   Continuous Infusions: . sodium chloride 10 mL/hr at 09/26/15 0737     Time spent: 25 min    [facility]  [facility] Pager [phone]. If 7PM-7AM, please contact night-coverage at [email or web address], Office  [phone]  password [email or web address] 09/29/2015, 12:07 PM  LOS: 6 days

## 2015-09-30 ENCOUNTER — Inpatient Hospital Stay (HOSPITAL_COMMUNITY): Payer: BLUE CROSS/BLUE SHIELD

## 2015-09-30 LAB — CBC
HEMATOCRIT: 35.3 % — AB (ref 39.0–52.0)
Hemoglobin: 12.3 g/dL — ABNORMAL LOW (ref 13.0–17.0)
MCH: 37.4 pg — AB (ref 26.0–34.0)
MCHC: 34.8 g/dL (ref 30.0–36.0)
MCV: 107.3 fL — AB (ref 78.0–100.0)
Platelets: 53 10*3/uL — ABNORMAL LOW (ref 150–400)
RBC: 3.29 MIL/uL — ABNORMAL LOW (ref 4.22–5.81)
RDW: 15.4 % (ref 11.5–15.5)
WBC: 6.2 10*3/uL (ref 4.0–10.5)

## 2015-09-30 LAB — URINALYSIS, ROUTINE W REFLEX MICROSCOPIC
Bilirubin Urine: NEGATIVE
GLUCOSE, UA: NEGATIVE mg/dL
HGB URINE DIPSTICK: NEGATIVE
Ketones, ur: NEGATIVE mg/dL
Leukocytes, UA: NEGATIVE
Nitrite: NEGATIVE
PROTEIN: NEGATIVE mg/dL
SPECIFIC GRAVITY, URINE: 1.014 (ref 1.005–1.030)
pH: 5.5 (ref 5.0–8.0)

## 2015-09-30 LAB — BLOOD GAS, ARTERIAL
Acid-Base Excess: 5.5 mmol/L — ABNORMAL HIGH (ref 0.0–2.0)
Bicarbonate: 28.6 mEq/L — ABNORMAL HIGH (ref 20.0–24.0)
Drawn by: 449561
FIO2: 0.4
O2 SAT: 91.1 %
PCO2 ART: 34.7 mmHg — AB (ref 35.0–45.0)
PO2 ART: 59.4 mmHg — AB (ref 80.0–100.0)
Patient temperature: 98.6
TCO2: 29.6 mmol/L (ref 0–100)
pH, Arterial: 7.526 — ABNORMAL HIGH (ref 7.350–7.450)

## 2015-09-30 LAB — BASIC METABOLIC PANEL
Anion gap: 6 (ref 5–15)
BUN: 9 mg/dL (ref 6–20)
CALCIUM: 7.6 mg/dL — AB (ref 8.9–10.3)
CO2: 23 mmol/L (ref 22–32)
CREATININE: 0.7 mg/dL (ref 0.61–1.24)
Chloride: 103 mmol/L (ref 101–111)
GFR calc Af Amer: >60 mL/min (ref >60)
GFR calc non Af Amer: >60 mL/min (ref >60)
GLUCOSE: 117 mg/dL — AB (ref 65–99)
Potassium: 4.7 mmol/L (ref 3.5–5.1)
Sodium: 132 mmol/L — ABNORMAL LOW (ref 135–145)

## 2015-09-30 MED ORDER — SALINE SPRAY 0.65 % NA SOLN
1.0000 | NASAL | Status: DC | PRN
  Filled 2015-09-30: qty 44

## 2015-09-30 NOTE — Progress Notes (Signed)
Upon assessment, patient was complaining of shortness of breath and increased abdominal distension with tenderness in the lower mid quadrant. Pt believes the SOB is not related to his recurrent pleural effusions and stated his pleurx was drained yesterday 7/15 with 500 output. Had to increase HFNC from 10L-13L. Lung sounds were clear and diminished. [place] with [facility] notified. Chest and abdominal x-ray, UA and labs ordered. Will continue to monitor.

## 2015-09-30 NOTE — Progress Notes (Signed)
POA- Clifton CustardAaron Case approached this RN to explain that the patient is "not acting right". Mr Roy Case described the patient as acting, "childish and not normal". This RN talked to the pt, and asked if he experiencing any confusion. The patient said he has but answered all oriented questions appropriately. The patient had a lengthy and indepth conversation with this RN about recent family members that had died in March. He mentioned that he was suicidal in the past, but was not now. [place] with [facility] was notified.  Ammonia level and blood gas was ordered due to his altered mental status. This RN asked for a possible psych consult. Will continue to monitor.

## 2015-09-30 NOTE — Progress Notes (Signed)
[facility]  PROGRESS NOTE  Roy Case UJW:[id number] DOB: 1965-12-07 DOA: 09/23/2015 PCP: Jeanine Luz, FNP    Brief HPI:   Active Problems:   Chronic respiratory failure with hypoxia (HCC)   Hepatic cirrhosis (HCC)   Thrombocytopenia (HCC)   Pleural effusion associated with hepatic disorder   Syncope and collapse   Assessment/Plan: 1. Syncope and collapse- likely from dehydration and volume depletion from viral gastroenteritis. Troponin negative. Echo showed EF 60-65%, grade 1 diastolic dysfunction. Restarted Lasix 40 mg twice daily ,as patient now has stable blood pressure. Continue with Aldactone. 2. Diarrhea- resolved, likely from viral gastroenteritis. C. difficile is negative. GI pathogen panel is negative. 3. Liver cirrhosis with ascites- stable, last paracentesis 6 weeks ago. On 08/26/2015 drained 2 L. No evidence of SBP. Flagyl and Azactam discontinued. Lasix restarted today. 4. Chronic respiratory failure with hypoxia- patient hypoxic with 88% on 5 L nasal cannula. Doesn't appear short of breath, chest x-ray is clear.  5. 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 Drained 500 mL on 09/24/2015. Patient would drain once a week every Wednesday 6. 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 7. Hypokalemia- replete, magnesium 1.9 8. UTI- UA is positive for nitrite, started   bactrim DS 1 tab po bid, urine cultures are showing insignificant growth.    DVT prophylaxis: SCD Code Status: Full code Family Communication: *No family at bedside Disposition Plan: Skilled nursing facility when bed available   Consultants:  None   Procedures:  None   Antibiotics:  Anti-infectives    Start   Dose/Rate Route Frequency Ordered Stop   09/23/15 2300  aztreonam (AZACTAM) 1 g in dextrose 5 % 50 mL IVPB Status: Discontinued    1 g 100 mL/hr over 30 Minutes  Intravenous Every 8 hours 09/23/15 2236 09/25/15 0718   09/23/15 2300  metroNIDAZOLE (FLAGYL) IVPB 500 mg Status: Discontinued    500 mg 100 mL/hr over 60 Minutes Intravenous Every 8 hours 09/23/15 2236 09/25/15 0718   09/23/15 1800  levofloxacin (LEVAQUIN) IVPB 750 mg Status: Discontinued    750 mg 100 mL/hr over 90 Minutes Intravenous Every 24 hours 09/23/15 1749 09/23/15 2223           Subjective: Patient seen and examined, breathing is better, CXR shows small pleural effusions. Started on Bactrim for abnormal UA. Urine culture growing multiple species. Denies any complaints today.  Objective: Filed Vitals:   09/30/15 0353 09/30/15 0606 09/30/15 0800 09/30/15 0919  BP:  108/62 111/77   Pulse: 99     Temp: 98.6 F (37 C)  98.3 F (36.8 C)   TempSrc: Oral  Oral   Resp:  18 14   Height:      Weight:      SpO2:  90% 96% 98%    Intake/Output Summary (Last 24 hours) at 09/30/15 1112 Last data filed at 09/30/15 0800  Gross per 24 hour  Intake    240 ml  Output   2275 ml  Net  -2035 ml   Filed Weights   09/23/15 1422 09/24/15 0635  Weight: 78.019 kg (172 lb) 81.2 kg (179 lb 0.2 oz)    Examination:  General exam: Appears calm and comfortable  Respiratory system: Clear to auscultation. Respiratory effort normal. Cardiovascular system: S1 & S2 heard, RRR. No JVD, murmurs, rubs, gallops or clicks. Bilateral 1+ pedal edema. Gastrointestinal system: Abdomen is nondistended, soft and nontender. No organomegaly or masses felt. Normal bowel sounds heard.  Central nervous system: Alert and oriented. No focal neurological deficits. Extremities: Symmetric 5 x 5 power.  Skin: No rashes, lesions or ulcers Psychiatry: Judgement and insight appear normal. Mood & affect appropriate.    Data Reviewed: I have personally reviewed following labs and imaging studies Basic Metabolic Panel:  Recent Labs Lab 09/23/15 1824  09/24/15 1113 09/25/15 0556  09/26/15 0402 09/27/15 0317 09/28/15 0320  NA  --   < > 140 135 135 134* 132*  K  --   < > 3.5 3.4* 3.7 3.8 3.9  CL  --   < > 109 107 106 104 102  CO2  --   < > GLUCOSE  --   < > 94 99 99 99 92  BUN  --   < > CREATININE  --   < > 0.73 0.66 0.59* 0.63 0.70  CALCIUM  --   < > 8.0* 7.5* 7.5* 7.5* 7.5*  MG 1.8  --   --  1.9  --   --   --   PHOS 3.3  --   --   --   --   --   --   < > = values in this interval not displayed. Liver Function Tests:  Recent Labs Lab 09/23/15 1435 09/24/15 0512  AST 64* 53*  ALT 32 28  ALKPHOS 71 55  BILITOT 2.6* 2.9*  PROT 6.2* 5.6*  ALBUMIN 2.3* 1.9*    Recent Labs Lab 09/23/15 1435  LIPASE 36  CBC:  Recent Labs Lab 09/23/15 1551 09/24/15 0512 09/24/15 1113 09/26/15 0402 09/27/15 0317  WBC 4.9 3.2* 4.0 5.0 4.7  HGB 12.5* 11.6* 12.6* 11.5* 11.8*  HCT 36.2* 34.1* 36.5* 32.4* 33.2*  MCV 106.5* 106.9* 107.0* 105.5* 106.1*  PLT 69* 58* 62* 62* 61*   Cardiac Enzymes:  Recent Labs Lab 09/23/15 1824 09/23/15 2318 09/24/15 0512  TROPONINI <0.03 <0.03 <0.03   BNP (last 3 results)  Recent Labs  08/02/15 2124  BNP 25.5    ProBNP (last 3 results) No results for input(s): PROBNP in the last 8760 hours.  CBG: No results for input(s): GLUCAP in the last 168 hours.  Recent Results (from the past 240 hour(s))  MRSA PCR Screening     Status: None   Collection Time: 09/23/15  7:53 PM  Result Value Ref Range Status   MRSA by PCR NEGATIVE NEGATIVE Final    Comment:        The GeneXpert MRSA Assay (FDA approved for NASAL specimens only), is one component of a comprehensive MRSA colonization surveillance program. It is not intended to diagnose MRSA infection nor to guide or monitor treatment for MRSA infections.   Culture, body fluid-bottle     Status: None   Collection Time: 09/24/15 10:17 AM  Result Value Ref Range Status   Specimen Description FLUID PERITONEAL  Final   Special Requests NONE   Final   Culture NO GROWTH 5 DAYS  Final   Report Status 09/29/2015 FINAL  Final  Gram stain     Status: None   Collection Time: 09/24/15 10:17 AM  Result Value Ref Range Status   Specimen Description FLUID PERITONEAL  Final   Special Requests NONE  Final   Gram Stain   Final    MODERATE WBC PRESENT,BOTH PMN AND MONONUCLEAR NO ORGANISMS SEEN    Report Status 09/24/2015 FINAL  Final  C difficile quick scan w PCR reflex  Status: None   Collection Time: 09/25/15 10:23 AM  Result Value Ref Range Status   C Diff antigen NEGATIVE NEGATIVE Final   C Diff toxin NEGATIVE NEGATIVE Final   C Diff interpretation No C. difficile detected.  Final  Gastrointestinal Panel by PCR , Stool     Status: None   Collection Time: 09/25/15  5:49 PM  Result Value Ref Range Status   Campylobacter species NOT DETECTED NOT DETECTED Final   Plesimonas shigelloides NOT DETECTED NOT DETECTED Final   Salmonella species NOT DETECTED NOT DETECTED Final   Yersinia enterocolitica NOT DETECTED NOT DETECTED Final   Vibrio species NOT DETECTED NOT DETECTED Final   Vibrio cholerae NOT DETECTED NOT DETECTED Final   Enteroaggregative E coli (EAEC) NOT DETECTED NOT DETECTED Final   Enteropathogenic E coli (EPEC) NOT DETECTED NOT DETECTED Final   Enterotoxigenic E coli (ETEC) NOT DETECTED NOT DETECTED Final   Shiga like toxin producing E coli (STEC) NOT DETECTED NOT DETECTED Final   E. coli O157 NOT DETECTED NOT DETECTED Final   Shigella/Enteroinvasive E coli (EIEC) NOT DETECTED NOT DETECTED Final   Cryptosporidium NOT DETECTED NOT DETECTED Final   Cyclospora cayetanensis NOT DETECTED NOT DETECTED Final   Entamoeba histolytica NOT DETECTED NOT DETECTED Final   Giardia lamblia NOT DETECTED NOT DETECTED Final   Adenovirus F40/41 NOT DETECTED NOT DETECTED Final   Astrovirus NOT DETECTED NOT DETECTED Final   Norovirus GI/GII NOT DETECTED NOT DETECTED Final   Rotavirus A NOT DETECTED NOT DETECTED Final   Sapovirus (I, II,  IV, and V) NOT DETECTED NOT DETECTED Final  Urine culture     Status: Abnormal   Collection Time: 09/28/15  4:07 PM  Result Value Ref Range Status   Specimen Description URINE, CLEAN CATCH  Final   Special Requests NONE  Final   Culture MULTIPLE SPECIES PRESENT, SUGGEST RECOLLECTION (A)  Final   Report Status 09/29/2015 FINAL  Final     Studies: No results found.  Scheduled Meds: . fluticasone furoate-vilanterol  1 puff Inhalation Daily  . furosemide  40 mg Oral BID  . pantoprazole  40 mg Oral Daily  . sodium chloride flush  3 mL Intravenous Q12H  . spironolactone  25 mg Oral BID  . sulfamethoxazole-trimethoprim  1 tablet Oral Q12H   Continuous Infusions: . sodium chloride 10 mL/hr at 09/26/15 0737     Time spent: 25 min    [facility] Hospital[facility],Anay Walter S  [facility] Pager [phone]. If 7PM-7AM, please contact night-coverage at [email or web address], Office  [phone]  password [email or web address] 09/30/2015, 11:12 AM  LOS: 7 days

## 2015-10-01 ENCOUNTER — Inpatient Hospital Stay (HOSPITAL_COMMUNITY): Payer: BLUE CROSS/BLUE SHIELD

## 2015-10-01 LAB — AMMONIA: AMMONIA: 74 umol/L — AB (ref 9–35)

## 2015-10-01 MED ORDER — LACTULOSE 10 GM/15ML PO SOLN
20.0000 g | Freq: Three times a day (TID) | ORAL | Status: DC
  Administered 2015-10-01 – 2015-10-08 (×20): 20 g via ORAL
  Filled 2015-10-01 (×21): qty 30

## 2015-10-01 MED ORDER — LIDOCAINE HCL (PF) 1 % IJ SOLN
INTRAMUSCULAR | Status: AC
  Filled 2015-10-01: qty 10

## 2015-10-01 MED ORDER — LACTULOSE 10 GM/15ML PO SOLN
30.0000 g | Freq: Once | ORAL | Status: AC
Start: 2015-10-01 — End: 2015-10-01
  Administered 2015-10-01: 30 g via ORAL
  Filled 2015-10-01: qty 45

## 2015-10-01 NOTE — Progress Notes (Addendum)
Pt HCPOA- Aaron at bedside- CSW discussed care plan for pt- [facility] and RN pt is confused at this time.  [facility]CP[facility] is [facility] agreeable to [facility] and [facility] to accept with current medical concerns (drain)  Clifton Custardaron is [facility] in [facility]- CSW informed MD of HCPOA interest in hospice- per MD pt to get procedure today and will see if this affects breathing- will explore comfort care with pt HCPOA  CSW placed alternative options for SNF at bedside if needed- pt currently not appropriate for SNF as is requiring 15L [place]- most facilities cannot accept over 6L  CSW will continue to follow  Merlyn LotJenna Holoman, [facility] PcCSWA Clinical Social Worker [phone]

## 2015-10-01 NOTE — Progress Notes (Signed)
Physical Therapy Treatment Patient Details Name: Roy Case MRN: [id number] DOB: 10-27-1965 Today's Date: 10/01/2015    History of Present Illness 50 y.o. male with medical history significant for idiopathic hemachromatosis managed with phlebotomies, liver cirrhosis, chronic respiratory failure with supplemental oxygen requirement of 4-5 L/m, and pleural effusions with Pleurx catheter in the right chest presented to the ED with syncope and collapse related to dehydration,fever at home of 100.5.Paracentesis on 7/10, with 3 L, no evidence of SBP, has Pleurx cath, 500 mL drained on 7/10    PT Comments    [name] was very lethargic today, keeping his eyes closed throughout most of session.  Pt completed therapeutic exercises sitting EOB, fatiguing quickly with SpO2 dropping to 87% on 15L HFNC.  Recommendation for SNF at d/c remains appropriate.   Follow Up Recommendations  Supervision/Assistance - 24 hour;SNF     Equipment Recommendations  Rolling walker with 5" wheels    Recommendations for Other Services       Precautions / Restrictions Precautions Precautions: Fall;Other (comment) Precaution Comments: O2 dependent, hypoxic with ambulation Restrictions Weight Bearing Restrictions: No    Mobility  Bed Mobility Overal bed mobility: Needs Assistance Bed Mobility: Sidelying to Sit;Rolling;Sit to Sidelying Rolling: Min guard Sidelying to sit: Min guard     Sit to sidelying: Min guard General bed mobility comments: Cues for log roll technique due to c/o Rt flank pain.  Increased time and use of bed rail.  Transfers                 General transfer comment: did not attempt transfer as pt lethargic and keeps his eyes closed the majority of the session  Ambulation/Gait                 Stairs            Wheelchair Mobility    Modified Rankin (Stroke Patients Only)       Balance Overall balance assessment: Needs assistance Sitting-balance  support: No upper extremity supported;Feet supported Sitting balance-Leahy Scale: Fair Sitting balance - Comments: Sways some while sitting EOB due to lethargy                             Cognition Arousal/Alertness: Lethargic Behavior During Therapy: WFL for tasks assessed/performed Overall Cognitive Status: Within Functional Limits for tasks assessed                      Exercises General Exercises - Upper Extremity Shoulder Flexion: AROM;Both;10 reps;Seated Shoulder Extension: AROM;Both;5 reps;Seated General Exercises - Lower Extremity Ankle Circles/Pumps: AROM;Both;10 reps;Seated Long Arc Quad: AROM;Both;15 reps;Seated    General Comments General comments (skin integrity, edema, etc.): SpO2 down to 87% with exercises sitting EOB on 15L HFNC      Pertinent Vitals/Pain Pain Assessment: 0-10 Pain Score: 10-Worst pain ever Pain Location: Rt flank Pain Descriptors / Indicators: Grimacing;Aching Pain Intervention(s): Limited activity within patient's tolerance;Monitored during session;Repositioned    Home Living                      Prior Function            PT Goals (current goals can now be found in the care plan section) Acute Rehab PT Goals Patient Stated Goal: to get stronger PT Goal Formulation: With patient Time For Goal Achievement: 10/02/15 Potential to Achieve Goals: Good Progress towards PT goals: Not progressing toward goals -  comment (due to lethargy)    Frequency  Min 3X/week    PT Plan Current plan remains appropriate    Co-evaluation             End of Session Equipment Utilized During Treatment: Oxygen Activity Tolerance: Patient limited by lethargy;Patient limited by pain Patient left: in bed;with call bell/phone within reach;with family/visitor present     Time: [id number] PT Time Calculation (min) (ACUTE ONLY): 21 min  Charges:  $Therapeutic Exercise: 8-22 mins                    G Codes:      Encarnacion ChuAshley  Abashian PT, DPT  Pager: [phone] Phone: [phone] 10/01/2015, 11:36 AM

## 2015-10-01 NOTE — Progress Notes (Signed)
[facility]  PROGRESS NOTE  Roy Case WUJ:[id number] DOB: [birth year] DOA: 09/23/2015 PCP: Jeanine Luz, FNP    Brief HPI:   Active Problems:   Chronic respiratory failure with hypoxia (HCC)   Hepatic cirrhosis (HCC)   Thrombocytopenia (HCC)   Pleural effusion associated with hepatic disorder   Syncope and collapse   Assessment/Plan: 1. Syncope and collapse- resolved, likely from dehydration and volume depletion from viral gastroenteritis. Troponin negative. Echo showed EF 60-65%, grade 1 diastolic dysfunction. Restarted Lasix 40 mg twice daily ,as patient now has stable blood pressure. Continue with Aldactone. 2. Diarrhea- resolved, likely from viral gastroenteritis. C. difficile is negative. GI pathogen panel is negative. 3. Liver cirrhosis with ascites- stable, last paracentesis 6 weeks ago. On 08/26/2015 drained 2 L. No evidence of SBP. Flagyl and Azactam discontinued. Lasix restarted today.Will obtain UG guided paracentesis today. 4. Chronic respiratory failure with hypoxia- patient hypoxic with 88% on 5 L nasal cannula. Doesn't appear short of breath, chest x-ray is clear.  5. 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 Drained 500 mL on 09/24/2015. Patient would drain once a week every Wednesday 7. 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 8. Hypokalemia- replete, magnesium 1.9 9. UTI- UA is positive for nitrite, started   bactrim DS 1 tab po bid, urine cultures are showing insignificant growth. Will d/c bactrim.   DVT prophylaxis: SCD Code Status: Full code Family Communication: *No family at bedside Disposition Plan: Skilled nursing facility when bed available   Consultants:  None   Procedures:  None   Antibiotics:  Anti-infectives    Start   Dose/Rate Route Frequency  Ordered Stop   09/23/15 2300  aztreonam (AZACTAM) 1 g in dextrose 5 % 50 mL IVPB Status: Discontinued    1 g 100 mL/hr over 30 Minutes Intravenous Every 8 hours 09/23/15 2236 09/25/15 0718   09/23/15 2300  metroNIDAZOLE (FLAGYL) IVPB 500 mg Status: Discontinued    500 mg 100 mL/hr over 60 Minutes Intravenous Every 8 hours 09/23/15 2236 09/25/15 0718   09/23/15 1800  levofloxacin (LEVAQUIN) IVPB 750 mg Status: Discontinued    750 mg 100 mL/hr over 90 Minutes Intravenous Every 24 hours 09/23/15 1749 09/23/15 2223           Subjective: Patient seen and examined, breathing is better. Complains of worsening abdominal distension.  Objective: Filed Vitals:   10/01/15 0400 10/01/15 0756 10/01/15 0825 10/01/15 1213  BP: 114/70 123/82  120/68  Pulse: 90     Temp: 98.8 F (37.1 C) 98.3 F (36.8 C)  98.3 F (36.8 C)  TempSrc: Oral Oral  Oral  Resp: 13 20  11   Height:      Weight:      SpO2: 90% 91% 92% 96%    Intake/Output Summary (Last 24 hours) at 10/01/15 1359 Last data filed at 10/01/15 0312  Gross per 24 hour  Intake    240 ml  Output   1100 ml  Net   -860 ml   Filed Weights   09/23/15 1422 09/24/15 0635  Weight: 78.019 kg (172 lb) 81.2 kg (179 lb 0.2 oz)    Examination:  General exam: Appears calm and comfortable  Respiratory system: Clear to auscultation. Respiratory effort normal. Cardiovascular system: S1 & S2 heard, RRR. No JVD, murmurs, rubs, gallops or clicks. Bilateral 1+ pedal edema. Gastrointestinal system: Abdomen is nondistended, soft  and nontender. No organomegaly or masses felt. Normal bowel sounds heard. Central nervous system: Alert and oriented. No focal neurological deficits. Extremities: Symmetric 5 x 5 power.  Skin: No rashes, lesions or ulcers Psychiatry: Judgement and insight appear normal. Mood & affect appropriate.    Data Reviewed: I have personally reviewed following labs and imaging studies Basic  Metabolic Panel:  Recent Labs Lab 09/25/15 0556 09/26/15 0402 09/27/15 0317 09/28/15 0320 09/30/15 2011  NA 135 135 134* 132* 132*  K 3.4* 3.7 3.8 3.9 4.7  CL 107 106 104 102 103  CO2 GLUCOSE 99 99 99 92 117*  BUN CREATININE 0.66 0.59* 0.63 0.70 0.70  CALCIUM 7.5* 7.5* 7.5* 7.5* 7.6*  MG 1.9  --   --   --   --    Liver Function Tests: No results for input(s): AST, ALT, ALKPHOS, BILITOT, PROT, ALBUMIN in the last 168 hours. No results for input(s): LIPASE, AMYLASE in the last 168 hours.CBC:  Recent Labs Lab 09/26/15 0402 09/27/15 0317 09/30/15 2011  WBC 5.0 4.7 6.2  HGB 11.5* 11.8* 12.3*  HCT 32.4* 33.2* 35.3*  MCV 105.5* 106.1* 107.3*  PLT 62* 61* 53*   Cardiac Enzymes: No results for input(s): CKTOTAL, CKMB, CKMBINDEX, TROPONINI in the last 168 hours. BNP (last 3 results)  Recent Labs  08/02/15 2124  BNP 25.5    ProBNP (last 3 results) No results for input(s): PROBNP in the last 8760 hours.  CBG: No results for input(s): GLUCAP in the last 168 hours.  Recent Results (from the past 240 hour(s))  MRSA PCR Screening     Status: None   Collection Time: 09/23/15  7:53 PM  Result Value Ref Range Status   MRSA by PCR NEGATIVE NEGATIVE Final    Comment:        The GeneXpert MRSA Assay (FDA approved for NASAL specimens only), is one component of a comprehensive MRSA colonization surveillance program. It is not intended to diagnose MRSA infection nor to guide or monitor treatment for MRSA infections.   Culture, body fluid-bottle     Status: None   Collection Time: 09/24/15 10:17 AM  Result Value Ref Range Status   Specimen Description FLUID PERITONEAL  Final   Special Requests NONE  Final   Culture NO GROWTH 5 DAYS  Final   Report Status 09/29/2015 FINAL  Final  Gram stain     Status: None   Collection Time: 09/24/15 10:17 AM  Result Value Ref Range Status   Specimen Description FLUID PERITONEAL  Final   Special Requests  NONE  Final   Gram Stain   Final    MODERATE WBC PRESENT,BOTH PMN AND MONONUCLEAR NO ORGANISMS SEEN    Report Status 09/24/2015 FINAL  Final  C difficile quick scan w PCR reflex     Status: None   Collection Time: 09/25/15 10:23 AM  Result Value Ref Range Status   C Diff antigen NEGATIVE NEGATIVE Final   C Diff toxin NEGATIVE NEGATIVE Final   C Diff interpretation No C. difficile detected.  Final  Gastrointestinal Panel by PCR , Stool     Status: None   Collection Time: 09/25/15  5:49 PM  Result Value Ref Range Status   Campylobacter species NOT DETECTED NOT DETECTED Final   Plesimonas shigelloides NOT DETECTED NOT DETECTED Final   Salmonella species NOT DETECTED NOT DETECTED Final   Yersinia enterocolitica NOT DETECTED NOT DETECTED Final  Vibrio species NOT DETECTED NOT DETECTED Final   Vibrio cholerae NOT DETECTED NOT DETECTED Final   Enteroaggregative E coli (EAEC) NOT DETECTED NOT DETECTED Final   Enteropathogenic E coli (EPEC) NOT DETECTED NOT DETECTED Final   Enterotoxigenic E coli (ETEC) NOT DETECTED NOT DETECTED Final   Shiga like toxin producing E coli (STEC) NOT DETECTED NOT DETECTED Final   E. coli O157 NOT DETECTED NOT DETECTED Final   Shigella/Enteroinvasive E coli (EIEC) NOT DETECTED NOT DETECTED Final   Cryptosporidium NOT DETECTED NOT DETECTED Final   Cyclospora cayetanensis NOT DETECTED NOT DETECTED Final   Entamoeba histolytica NOT DETECTED NOT DETECTED Final   Giardia lamblia NOT DETECTED NOT DETECTED Final   Adenovirus F40/41 NOT DETECTED NOT DETECTED Final   Astrovirus NOT DETECTED NOT DETECTED Final   Norovirus GI/GII NOT DETECTED NOT DETECTED Final   Rotavirus A NOT DETECTED NOT DETECTED Final   Sapovirus (I, II, IV, and V) NOT DETECTED NOT DETECTED Final  Urine culture     Status: Abnormal   Collection Time: 09/28/15  4:07 PM  Result Value Ref Range Status   Specimen Description URINE, CLEAN CATCH  Final   Special Requests NONE  Final   Culture  MULTIPLE SPECIES PRESENT, SUGGEST RECOLLECTION (A)  Final   Report Status 09/29/2015 FINAL  Final     Studies: Dg Chest Port 1 View  09/30/2015  CLINICAL DATA:  Shortness of breath EXAM: PORTABLE CHEST 1 VIEW COMPARISON:  September 26, 2015 FINDINGS: A right pleural drain remains. No pneumothorax. The heart, hila, mediastinum, lungs, and pleura are otherwise unchanged and unremarkable. IMPRESSION: No active disease. Electronically Signed   By: Gerome Samavid  Williams III M.D   On: 09/30/2015 21:18   Dg Abd Portable 1v  09/30/2015  CLINICAL DATA:  Abdominal pain EXAM: PORTABLE ABDOMEN - 1 VIEW COMPARISON:  None. FINDINGS: The bowel gas pattern is normal. No radio-opaque calculi or other significant radiographic abnormality are seen. IMPRESSION: Negative. Electronically Signed   By: Gerome Samavid  Williams III M.D   On: 09/30/2015 21:19    Scheduled Meds: . fluticasone furoate-vilanterol  1 puff Inhalation Daily  . furosemide  40 mg Oral BID  . lactulose  20 g Oral TID  . pantoprazole  40 mg Oral Daily  . sodium chloride flush  3 mL Intravenous Q12H  . spironolactone  25 mg Oral BID  . sulfamethoxazole-trimethoprim  1 tablet Oral Q12H   Continuous Infusions: . sodium chloride 10 mL/hr at 09/26/15 0737     Time spent: 25 min    [facility],Kitti Mcclish S  [facility] Pager [phone]. If 7PM-7AM, please contact night-coverage at [email or web address], Office  [phone]  password [email or web address] 10/01/2015, 1:59 PM  LOS: 8 days

## 2015-10-01 NOTE — Procedures (Signed)
Successful US guided paracentesis from RLQ.  Yielded 2.45 L of clear yellow fluid.  No immediate complications.  Pt tolerated well.   Specimen was not sent for labs.  Brayton ElBRUNING, Donia Yokum PA-C 10/01/2015 4:32 PM

## 2015-10-01 NOTE — Progress Notes (Signed)
Pt cell phone and charger came up missing. Pt states that he knows one of his visitors from 10/01/15 took the phone.

## 2015-10-02 ENCOUNTER — Telehealth: Payer: Self-pay

## 2015-10-02 LAB — URINE CULTURE

## 2015-10-02 MED ORDER — IBUPROFEN 200 MG PO TABS
400.0000 mg | ORAL_TABLET | Freq: Once | ORAL | Status: AC
  Administered 2015-10-02: 400 mg via ORAL
  Filled 2015-10-02: qty 2

## 2015-10-02 NOTE — Progress Notes (Signed)
Oxygen pulse oximetry site changed to forehead. Patient has been worked down from 15L to 9L O2 via nasal cannula with no adverse effects or desaturations. Will continue to wean O2 as patient tolerates.

## 2015-10-02 NOTE — Plan of Care (Signed)
Problem: Health Behavior/Discharge Planning: Goal: Ability to manage health-related needs will improve Outcome: Progressing Patient alert & oriented x4 this AM and participating in discharge planning with MD and RN.

## 2015-10-02 NOTE — Telephone Encounter (Signed)
Have attempted to reach pt by phone and left messages but have not received a call back. Letter mailed to pt to call the office to schedule CT scan.

## 2015-10-02 NOTE — Telephone Encounter (Signed)
-----   Message from Beverley FiedlerJay M Pyrtle, MD sent at 09/12/2015  5:22 PM EDT ----- Regarding: RE: CT scan yes ----- Message -----    From: Chrystie NoseLinda R Mort Smelser, RN    Sent: 09/12/2015   2:06 PM      To: Beverley FiedlerJay M Pyrtle, MD Subject: FW: CT scan                                      ----- Message -----    From: Chrystie NoseLinda R Worthington Cruzan, RN    Sent: 09/12/2015   2:06 PM      To: Chrystie NoseLinda R Cai Anfinson, RN Subject: RE: CT scan                                    Do you still want this pt to have follow-up CT? ----- Message -----    From: Chrystie NoseLinda R Karee Forge, RN    Sent: 09/03/2015      To: Chrystie NoseLinda R Antonie Borjon, RN Subject: CT scan                                        Pt needs CT hepatic protocol with contrast in July

## 2015-10-02 NOTE — Progress Notes (Signed)
Patient has been weaned to 2L of O2.

## 2015-10-02 NOTE — Progress Notes (Signed)
[facility]  PROGRESS NOTE  Roy Case RUE:[id number] DOB: [birth year] DOA: 09/23/2015 PCP: Jeanine Luzalone, Gregory, FNP    Brief HPI:  50 y.o. male with medical history significant for idiopathic hemachromatosis managed with phlebotomies, liver cirrhosis, chronic respiratory failure with supplemental oxygen requirement of 4-5 L/m, and pleural effusions with Pleurx catheter in the right chest presented to the ED with syncope and collapse related to dehydration. Patient has been having vomiting and diarrhea since Wednesday. He continued to have diarrhea up until 2 AM this morning. Got up to go to the bathroom and passed out. He also noted to have a low-grade fever off 100.5 yesterday. Denies any hematemesis or hematochezia. Denies any chest pain or shortness of breath. Patient had abnormal UA, was started on Bactrim empirically. Urine culture grew insignificant growth so Bactrim was discontinued. Also patient underwent paracentesis on 10/01/2015. Breathing continues to be stable on nasal cannula requiring oxygen as high as 15 L/m. Patient's healthcare power of attorney was interested in comfort measures, consulted palliative care for goals of care discussion  Active Problems:   Chronic respiratory failure with hypoxia (HCC)   Hepatic cirrhosis (HCC)   Thrombocytopenia (HCC)   Pleural effusion associated with hepatic disorder   Syncope and collapse   Assessment/Plan: 1. Syncope and collapse- resolved, likely from dehydration and volume depletion from viral gastroenteritis. Troponin negative. Echo showed EF 60-65%, grade 1 diastolic dysfunction. Restarted Lasix 40 mg twice daily ,as patient now has stable blood pressure. Continue with Aldactone. 2. Diarrhea- resolved, likely from viral gastroenteritis. C. difficile is negative. GI pathogen panel is negative. 3. Liver cirrhosis with ascites- stable, last paracentesis on 10/01/15 drained 2.5 L . Flagyl and Azactam discontinued. Lasix  restarted. 4. Chronic respiratory failure with hypoxia- patient is requiring 15 L oxygen on 15 L of oxygen. Doesn't appear short of breath, chest x-ray is clear.  5. Hepatic encephalopathy- ammonia level is 74, on  lactulose 20 g po TID 6. 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 Drained 500 mL on 09/24/2015. Patient would drain once a week every Wednesday 7. 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 8. Hypokalemia- replete, magnesium 1.9 9. UTI- UA was positive for nitrite, started   bactrim DS 1 tab po bid, urine cultures are showing insignificant growth. Discontinued Bactrim.   DVT prophylaxis: SCD Code Status: Full code Family Communication: Discussed with Hillis RangeAaron Powers, patient's healthcare power of attorney, will get better to get a consultation for goals of care Disposition Plan: To be  decided based upon discussion with palliative care.   Consultants:  None   Procedures:  None   Antibiotics:  Anti-infectives    Start   Dose/Rate Route Frequency Ordered Stop   09/23/15 2300  aztreonam (AZACTAM) 1 g in dextrose 5 % 50 mL IVPB Status: Discontinued    1 g 100 mL/hr over 30 Minutes Intravenous Every 8 hours 09/23/15 2236 09/25/15 0718   09/23/15 2300  metroNIDAZOLE (FLAGYL) IVPB 500 mg Status: Discontinued    500 mg 100 mL/hr over 60 Minutes Intravenous Every 8 hours 09/23/15 2236 09/25/15 0718   09/23/15 1800  levofloxacin (LEVAQUIN) IVPB 750 mg Status: Discontinued    750 mg 100 mL/hr over 90 Minutes Intravenous Every 24 hours 09/23/15 1749 09/23/15 2223           Subjective: Patient seen and examined, breathing is better. Status post paracentesis yesterday. Patient wants to have discussion with palliative care, regarding dispositional options.  Objective:  Filed Vitals:   10/02/15 0502 10/02/15 0749 10/02/15 0800 10/02/15  0816  BP: 107/72 92/44 125/80   Pulse: 96     Temp: 98.6 F (37 C) 97.5 F (36.4 C)    TempSrc: Oral Oral    Resp: 14 22 16    Height:      Weight:      SpO2: 95% 95% 95% 94%    Intake/Output Summary (Last 24 hours) at 10/02/15 1151 Last data filed at 10/02/15 0900  Gross per 24 hour  Intake   1563 ml  Output      0 ml  Net   1563 ml   Filed Weights   09/23/15 1422 09/24/15 0635  Weight: 78.019 kg (172 lb) 81.2 kg (179 lb 0.2 oz)    Examination:  General exam: Appears calm and comfortable  Respiratory system: Clear to auscultation. Respiratory effort normal. Cardiovascular system: S1 & S2 heard, RRR. No JVD, murmurs, rubs, gallops or clicks. Bilateral 1+ pedal edema. Gastrointestinal system: Abdomen is nondistended, soft and nontender. No organomegaly or masses felt. Normal bowel sounds heard. Central nervous system: Alert and oriented. No focal neurological deficits. Extremities: Symmetric 5 x 5 power.  Skin: No rashes, lesions or ulcers Psychiatry: Judgement and insight appear normal. Mood & affect appropriate.    Data Reviewed: I have personally reviewed following labs and imaging studies Basic Metabolic Panel:  Recent Labs Lab 09/26/15 0402 09/27/15 0317 09/28/15 0320 09/30/15 2011  NA 135 134* 132* 132*  K 3.7 3.8 3.9 4.7  CL 106 104 102 103  CO2 25 26 23 23   GLUCOSE 99 99 92 117*  BUN 6 7 7 9   CREATININE 0.59* 0.63 0.70 0.70  CALCIUM 7.5* 7.5* 7.5* 7.6*     Recent Labs Lab 09/26/15 0402 09/27/15 0317 09/30/15 2011  WBC 5.0 4.7 6.2  HGB 11.5* 11.8* 12.3*  HCT 32.4* 33.2* 35.3*  MCV 105.5* 106.1* 107.3*  PLT 62* 61* 53*     Recent Labs  08/02/15 2124  BNP 25.5    ProBNP (last 3 results) No results for input(s): PROBNP in the last 8760 hours.  CBG: No results for input(s): GLUCAP in the last 168 hours.  Recent Results (from the past 240 hour(s))  MRSA PCR Screening     Status: None   Collection Time: 09/23/15  7:53 PM  Result  Value Ref Range Status   MRSA by PCR NEGATIVE NEGATIVE Final    Comment:        The GeneXpert MRSA Assay (FDA approved for NASAL specimens only), is one component of a comprehensive MRSA colonization surveillance program. It is not intended to diagnose MRSA infection nor to guide or monitor treatment for MRSA infections.   Culture, body fluid-bottle     Status: None   Collection Time: 09/24/15 10:17 AM  Result Value Ref Range Status   Specimen Description FLUID PERITONEAL  Final   Special Requests NONE  Final   Culture NO GROWTH 5 DAYS  Final   Report Status 09/29/2015 FINAL  Final  Gram stain     Status: None   Collection Time: 09/24/15 10:17 AM  Result Value Ref Range Status   Specimen Description FLUID PERITONEAL  Final   Special Requests NONE  Final   Gram Stain   Final    MODERATE WBC PRESENT,BOTH PMN AND MONONUCLEAR NO ORGANISMS SEEN    Report Status 09/24/2015 FINAL  Final  C difficile quick scan w PCR reflex     Status: None  Collection Time: 09/25/15 10:23 AM  Result Value Ref Range Status   C Diff antigen NEGATIVE NEGATIVE Final   C Diff toxin NEGATIVE NEGATIVE Final   C Diff interpretation No C. difficile detected.  Final  Gastrointestinal Panel by PCR , Stool     Status: None   Collection Time: 09/25/15  5:49 PM  Result Value Ref Range Status   Campylobacter species NOT DETECTED NOT DETECTED Final   Plesimonas shigelloides NOT DETECTED NOT DETECTED Final   Salmonella species NOT DETECTED NOT DETECTED Final   Yersinia enterocolitica NOT DETECTED NOT DETECTED Final   Vibrio species NOT DETECTED NOT DETECTED Final   Vibrio cholerae NOT DETECTED NOT DETECTED Final   Enteroaggregative E coli (EAEC) NOT DETECTED NOT DETECTED Final   Enteropathogenic E coli (EPEC) NOT DETECTED NOT DETECTED Final   Enterotoxigenic E coli (ETEC) NOT DETECTED NOT DETECTED Final   Shiga like toxin producing E coli (STEC) NOT DETECTED NOT DETECTED Final   E. coli O157 NOT DETECTED  NOT DETECTED Final   Shigella/Enteroinvasive E coli (EIEC) NOT DETECTED NOT DETECTED Final   Cryptosporidium NOT DETECTED NOT DETECTED Final   Cyclospora cayetanensis NOT DETECTED NOT DETECTED Final   Entamoeba histolytica NOT DETECTED NOT DETECTED Final   Giardia lamblia NOT DETECTED NOT DETECTED Final   Adenovirus F40/41 NOT DETECTED NOT DETECTED Final   Astrovirus NOT DETECTED NOT DETECTED Final   Norovirus GI/GII NOT DETECTED NOT DETECTED Final   Rotavirus A NOT DETECTED NOT DETECTED Final   Sapovirus (I, II, IV, and V) NOT DETECTED NOT DETECTED Final  Urine culture     Status: Abnormal   Collection Time: 09/28/15  4:07 PM  Result Value Ref Range Status   Specimen Description URINE, CLEAN CATCH  Final   Special Requests NONE  Final   Culture MULTIPLE SPECIES PRESENT, SUGGEST RECOLLECTION (A)  Final   Report Status 09/29/2015 FINAL  Final  Urine culture     Status: Abnormal   Collection Time: 09/30/15  8:18 PM  Result Value Ref Range Status   Specimen Description URINE, CLEAN CATCH  Final   Special Requests NONE  Final   Culture MULTIPLE SPECIES PRESENT, SUGGEST RECOLLECTION (A)  Final   Report Status 10/02/2015 FINAL  Final     Studies: US Paracentesis  10/01/2015  INDICATION: Cirrhosis. Abdominal distention secondary to recurrent ascites. Request for therapeutic paracentesis. EXAM: ULTRASOUND GUIDED RIGHT LOWER QUADRANT PARACENTESIS MEDICATIONS: None. COMPLICATIONS: None immediate. PROCEDURE: Informed written consent was obtained from the patient after a discussion of the risks, benefits and alternatives to treatment. A timeout was performed prior to the initiation of the procedure. Initial ultrasound scanning demonstrates a large amount of ascites within the right lower abdominal quadrant. The right lower abdomen was prepped and draped in the usual sterile fashion. 1% lidocaine with epinephrine was used for local anesthesia. Following this, a 19 gauge, 7-cm, Yueh catheter was  introduced. An ultrasound image was saved for documentation purposes. The paracentesis was performed. The catheter was removed and a dressing was applied. The patient tolerated the procedure well without immediate post procedural complication. FINDINGS: A total of approximately 2.45 L of clear yellow fluid was removed. IMPRESSION: Successful ultrasound-guided paracentesis yielding 2.45 liters of peritoneal fluid. Read by: Brayton El PA-C Electronically Signed   By: Gilmer Mor D.O.   On: 10/01/2015 16:34   Dg Chest Port 1 View  09/30/2015  CLINICAL DATA:  Shortness of breath EXAM: PORTABLE CHEST 1 VIEW COMPARISON:  September 26, 2015  FINDINGS: A right pleural drain remains. No pneumothorax. The heart, hila, mediastinum, lungs, and pleura are otherwise unchanged and unremarkable. IMPRESSION: No active disease. Electronically Signed   By: Gerome Sam III M.D   On: 09/30/2015 21:18   Dg Abd Portable 1v  09/30/2015  CLINICAL DATA:  Abdominal pain EXAM: PORTABLE ABDOMEN - 1 VIEW COMPARISON:  None. FINDINGS: The bowel gas pattern is normal. No radio-opaque calculi or other significant radiographic abnormality are seen. IMPRESSION: Negative. Electronically Signed   By: Gerome Sam III M.D   On: 09/30/2015 21:19    Scheduled Meds: . fluticasone furoate-vilanterol  1 puff Inhalation Daily  . furosemide  40 mg Oral BID  . lactulose  20 g Oral TID  . pantoprazole  40 mg Oral Daily  . sodium chloride flush  3 mL Intravenous Q12H  . spironolactone  25 mg Oral BID   Continuous Infusions: . sodium chloride 10 mL/hr at 09/26/15 0737     Time spent: 25 min    [facility]  [facility] Pager [phone]. If 7PM-7AM, please contact night-coverage at [email or web address], Office  [phone]  password [email or web address] 10/02/2015, 11:51 AM  LOS: 9 days

## 2015-10-02 NOTE — Progress Notes (Signed)
MD notified pt c/o headache. New order for motrin received.

## 2015-10-02 NOTE — Progress Notes (Signed)
Patient states "I don't want to go somewhere that won't let me back out and go to my home eventually". [facility] This was witnessed by this RN, MD Sharl MaLama, and Kristeen Misshris Woodard RN. Patient is alert & oriented x4 and in no distress or pain. MD Sharl MaLama notified Hillis RangeAaron Powers and this RN notified CSW.

## 2015-10-02 NOTE — Plan of Care (Signed)
Problem: Physical Regulation: Goal: Ability to maintain clinical measurements within normal limits will improve Outcome: Progressing Patient is tolerating weaning down of O2 without adverse effects or desaturations.

## 2015-10-03 DIAGNOSIS — R531 Weakness: Secondary | ICD-10-CM

## 2015-10-03 DIAGNOSIS — J9611 Chronic respiratory failure with hypoxia: Secondary | ICD-10-CM

## 2015-10-03 DIAGNOSIS — R110 Nausea: Secondary | ICD-10-CM

## 2015-10-03 DIAGNOSIS — Z7189 Other specified counseling: Secondary | ICD-10-CM | POA: Insufficient documentation

## 2015-10-03 DIAGNOSIS — Z515 Encounter for palliative care: Secondary | ICD-10-CM | POA: Insufficient documentation

## 2015-10-03 DIAGNOSIS — K7460 Unspecified cirrhosis of liver: Principal | ICD-10-CM

## 2015-10-03 DIAGNOSIS — F411 Generalized anxiety disorder: Secondary | ICD-10-CM | POA: Insufficient documentation

## 2015-10-03 DIAGNOSIS — K7290 Hepatic failure, unspecified without coma: Secondary | ICD-10-CM | POA: Insufficient documentation

## 2015-10-03 DIAGNOSIS — K7682 Hepatic encephalopathy: Secondary | ICD-10-CM | POA: Insufficient documentation

## 2015-10-03 LAB — COMPREHENSIVE METABOLIC PANEL
ALBUMIN: 2 g/dL — AB (ref 3.5–5.0)
ALT: 30 U/L (ref 17–63)
AST: 71 U/L — ABNORMAL HIGH (ref 15–41)
Alkaline Phosphatase: 59 U/L (ref 38–126)
Anion gap: 4 — ABNORMAL LOW (ref 5–15)
BUN: 6 mg/dL (ref 6–20)
CHLORIDE: 105 mmol/L (ref 101–111)
CO2: 28 mmol/L (ref 22–32)
Calcium: 7.9 mg/dL — ABNORMAL LOW (ref 8.9–10.3)
Creatinine, Ser: 0.74 mg/dL (ref 0.61–1.24)
GFR calc non Af Amer: >60 mL/min (ref >60)
GLUCOSE: 106 mg/dL — AB (ref 65–99)
Potassium: 3.4 mmol/L — ABNORMAL LOW (ref 3.5–5.1)
SODIUM: 137 mmol/L (ref 135–145)
TOTAL PROTEIN: 5.6 g/dL — AB (ref 6.5–8.1)
Total Bilirubin: 1.6 mg/dL — ABNORMAL HIGH (ref 0.3–1.2)

## 2015-10-03 LAB — PROTIME-INR
INR: 1.79 — AB (ref 0.00–1.49)
PROTHROMBIN TIME: 20.8 s — AB (ref 11.6–15.2)

## 2015-10-03 MED ORDER — RIFAXIMIN 550 MG PO TABS
550.0000 mg | ORAL_TABLET | Freq: Two times a day (BID) | ORAL | Status: DC
  Administered 2015-10-03 – 2015-10-09 (×13): 550 mg via ORAL
  Filled 2015-10-03 (×12): qty 1

## 2015-10-03 MED ORDER — ALPRAZOLAM 0.5 MG PO TABS
0.5000 mg | ORAL_TABLET | Freq: Two times a day (BID) | ORAL | Status: DC | PRN
  Administered 2015-10-05 – 2015-10-09 (×4): 0.5 mg via ORAL
  Filled 2015-10-03 (×4): qty 1

## 2015-10-03 MED ORDER — POTASSIUM CHLORIDE CRYS ER 20 MEQ PO TBCR
40.0000 meq | EXTENDED_RELEASE_TABLET | Freq: Two times a day (BID) | ORAL | Status: AC
  Administered 2015-10-03 – 2015-10-04 (×2): 40 meq via ORAL
  Filled 2015-10-03 (×2): qty 2

## 2015-10-03 MED ORDER — ALPRAZOLAM 0.5 MG PO TABS
0.5000 mg | ORAL_TABLET | Freq: Every day | ORAL | Status: DC
  Administered 2015-10-03 – 2015-10-08 (×6): 0.5 mg via ORAL
  Filled 2015-10-03 (×6): qty 1

## 2015-10-03 NOTE — Progress Notes (Addendum)
[facility]  PROGRESS NOTE  Roy Case:[id number] DOB: Apr 19, 1965 DOA: 09/23/2015 PCP: Jeanine Luz, FNP    Brief HPI:  50 y.o. male with medical history significant for idiopathic hemachromatosis managed with phlebotomies, liver cirrhosis, chronic respiratory failure with supplemental oxygen requirement of 4-5 L/m, and pleural effusions with Pleurx catheter in the right chest presented to the ED with syncope and collapse related to dehydration. Patient has been having vomiting and diarrhea since Wednesday. He continued to have diarrhea up until 2 AM this morning. Got up to go to the bathroom and passed out. He also noted to have a low-grade fever off 100.5 yesterday. Denies any hematemesis or hematochezia. Denies any chest pain or shortness of breath. Patient had abnormal UA, was started on Bactrim empirically. Urine culture grew insignificant growth so Bactrim was discontinued. Also patient underwent paracentesis on 10/01/2015. Breathing continues to be stable on nasal cannula requiring oxygen as high as 15 L/m. Patient's healthcare power of attorney was interested in comfort measures, consulted palliative care for goals of care discussion  Active Problems:   Chronic respiratory failure with hypoxia (HCC)   Hepatic cirrhosis (HCC)   Thrombocytopenia (HCC)   Pleural effusion associated with hepatic disorder   Syncope and collapse   Assessment/Plan: 1. Syncope and collapse- resolved, likely from dehydration and volume depletion from viral gastroenteritis. Troponin negative. Echo showed EF 60-65%, grade 1 diastolic dysfunction. Restarted Lasix 40 mg twice  ,as patient now has stable blood pressure. Continue with Aldactone. 2. Diarrhea- resolved, likely from viral gastroenteritis. C. difficile is negative. GI pathogen panel is negative. 3. Liver cirrhosis with ascites- stable, last paracentesis on 10/01/15 drained 2.5 L . Flagyl and Azactam discontinued. Lasix restarted. Ammonia  increased. He report 3 BM today, continue with lactulose. Start rifampin.  4. Chronic respiratory failure with hypoxia- patient is requiring 3L,  Doesn't appear short of breath, chest x-ray is clear.  5. Hepatic encephalopathy- ammonia level is 74, on  lactulose 20 g po TID. MELS score 17, 6 % (calculated for labs 7-10 ) mortality in 3 months.  6. 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 Drained 500 mL on 09/24/2015. Patient would drain once a week every Wednesday 7. 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 8. Hypokalemia- check labs.  9. UTI- UA was positive for nitrite, started   bactrim DS 1 tab po bid, urine cultures are showing insignificant growth. Discontinued Bactrim.   DVT prophylaxis: SCD Code Status: Full code Family Communication: none at bedside.  Disposition Plan: To be  decided based upon discussion with palliative care.   Consultants:  None   Procedures:  None   Antibiotics:  Anti-infectives    Start   Dose/Rate Route Frequency Ordered Stop   09/23/15 2300  aztreonam (AZACTAM) 1 g in dextrose 5 % 50 mL IVPB Status: Discontinued    1 g 100 mL/hr over 30 Minutes Intravenous Every 8 hours 09/23/15 2236 09/25/15 0718   09/23/15 2300  metroNIDAZOLE (FLAGYL) IVPB 500 mg Status: Discontinued    500 mg 100 mL/hr over 60 Minutes Intravenous Every 8 hours 09/23/15 2236 09/25/15 0718   09/23/15 1800  levofloxacin (LEVAQUIN) IVPB 750 mg Status: Discontinued    750 mg 100 mL/hr over 90 Minutes Intravenous Every 24 hours 09/23/15 1749 09/23/15 2223           Subjective: Patient alert, oriented to place, he agree to speak with palliative care for goals of care. He was  suppose to follow [facility] at [facility] for transplant list. He will discussed today with palliative goals of care.   Objective: Filed Vitals:   10/02/15 1903 10/02/15 2237  10/03/15 0515 10/03/15 0830  BP: 116/68 106/65 102/52   Pulse: 92 84 76   Temp: 98.2 F (36.8 C) 98.4 F (36.9 C) 98.2 F (36.8 C)   TempSrc: Oral Oral Oral   Resp: 16 17 16    Height:      Weight:      SpO2: 91% 90% 93% 94%    Intake/Output Summary (Last 24 hours) at 10/03/15 1027 Last data filed at 10/02/15 1700  Gross per 24 hour  Intake    600 ml  Output      0 ml  Net    600 ml   Filed Weights   09/23/15 1422 09/24/15 0635  Weight: 78.019 kg (172 lb) 81.2 kg (179 lb 0.2 oz)    Examination:  General exam: Appears calm and comfortable  Respiratory system: Clear to auscultation. Respiratory effort normal. Cardiovascular system: S1 & S2 heard, RRR. No JVD, murmurs, rubs, gallops or clicks. Bilateral 1+ pedal edema. Gastrointestinal system: Abdomen is nondistended, soft and nontender. No organomegaly or masses felt. Normal bowel sounds heard. Central nervous system: Alert and oriented. No focal neurological deficits. Extremities: Symmetric 5 x 5 power.  Skin: No rashes, lesions or ulcers Psychiatry: Judgement and insight appear normal. Mood & affect appropriate.    Data Reviewed: I have personally reviewed following labs and imaging studies Basic Metabolic Panel:  Recent Labs Lab 09/27/15 0317 09/28/15 0320 09/30/15 2011  NA 134* 132* 132*  K 3.8 3.9 4.7  CL 104 102 103  CO2 26 23 23   GLUCOSE 99 92 117*  BUN 7 7 9   CREATININE 0.63 0.70 0.70  CALCIUM 7.5* 7.5* 7.6*     Recent Labs Lab 09/27/15 0317 09/30/15 2011  WBC 4.7 6.2  HGB 11.8* 12.3*  HCT 33.2* 35.3*  MCV 106.1* 107.3*  PLT 61* 53*     Recent Labs  08/02/15 2124  BNP 25.5    ProBNP (last 3 results) No results for input(s): PROBNP in the last 8760 hours.  CBG: No results for input(s): GLUCAP in the last 168 hours.  Recent Results (from the past 240 hour(s))  MRSA PCR Screening     Status: None   Collection Time: 09/23/15  7:53 PM  Result Value Ref Range Status   MRSA by PCR  NEGATIVE NEGATIVE Final    Comment:        The GeneXpert MRSA Assay (FDA approved for NASAL specimens only), is one component of a comprehensive MRSA colonization surveillance program. It is not intended to diagnose MRSA infection nor to guide or monitor treatment for MRSA infections.   Culture, body fluid-bottle     Status: None   Collection Time: 09/24/15 10:17 AM  Result Value Ref Range Status   Specimen Description FLUID PERITONEAL  Final   Special Requests NONE  Final   Culture NO GROWTH 5 DAYS  Final   Report Status 09/29/2015 FINAL  Final  Gram stain     Status: None   Collection Time: 09/24/15 10:17 AM  Result Value Ref Range Status   Specimen Description FLUID PERITONEAL  Final   Special Requests NONE  Final   Gram Stain   Final    MODERATE WBC PRESENT,BOTH PMN AND MONONUCLEAR NO ORGANISMS SEEN    Report Status 09/24/2015 FINAL  Final  C difficile quick scan  w PCR reflex     Status: None   Collection Time: 09/25/15 10:23 AM  Result Value Ref Range Status   C Diff antigen NEGATIVE NEGATIVE Final   C Diff toxin NEGATIVE NEGATIVE Final   C Diff interpretation No C. difficile detected.  Final  Gastrointestinal Panel by PCR , Stool     Status: None   Collection Time: 09/25/15  5:49 PM  Result Value Ref Range Status   Campylobacter species NOT DETECTED NOT DETECTED Final   Plesimonas shigelloides NOT DETECTED NOT DETECTED Final   Salmonella species NOT DETECTED NOT DETECTED Final   Yersinia enterocolitica NOT DETECTED NOT DETECTED Final   Vibrio species NOT DETECTED NOT DETECTED Final   Vibrio cholerae NOT DETECTED NOT DETECTED Final   Enteroaggregative E coli (EAEC) NOT DETECTED NOT DETECTED Final   Enteropathogenic E coli (EPEC) NOT DETECTED NOT DETECTED Final   Enterotoxigenic E coli (ETEC) NOT DETECTED NOT DETECTED Final   Shiga like toxin producing E coli (STEC) NOT DETECTED NOT DETECTED Final   E. coli O157 NOT DETECTED NOT DETECTED Final    Shigella/Enteroinvasive E coli (EIEC) NOT DETECTED NOT DETECTED Final   Cryptosporidium NOT DETECTED NOT DETECTED Final   Cyclospora cayetanensis NOT DETECTED NOT DETECTED Final   Entamoeba histolytica NOT DETECTED NOT DETECTED Final   Giardia lamblia NOT DETECTED NOT DETECTED Final   Adenovirus F40/41 NOT DETECTED NOT DETECTED Final   Astrovirus NOT DETECTED NOT DETECTED Final   Norovirus GI/GII NOT DETECTED NOT DETECTED Final   Rotavirus A NOT DETECTED NOT DETECTED Final   Sapovirus (I, II, IV, and V) NOT DETECTED NOT DETECTED Final  Urine culture     Status: Abnormal   Collection Time: 09/28/15  4:07 PM  Result Value Ref Range Status   Specimen Description URINE, CLEAN CATCH  Final   Special Requests NONE  Final   Culture MULTIPLE SPECIES PRESENT, SUGGEST RECOLLECTION (A)  Final   Report Status 09/29/2015 FINAL  Final  Urine culture     Status: Abnormal   Collection Time: 09/30/15  8:18 PM  Result Value Ref Range Status   Specimen Description URINE, CLEAN CATCH  Final   Special Requests NONE  Final   Culture MULTIPLE SPECIES PRESENT, SUGGEST RECOLLECTION (A)  Final   Report Status 10/02/2015 FINAL  Final     Studies: US Paracentesis  10/01/2015  INDICATION: Cirrhosis. Abdominal distention secondary to recurrent ascites. Request for therapeutic paracentesis. EXAM: ULTRASOUND GUIDED RIGHT LOWER QUADRANT PARACENTESIS MEDICATIONS: None. COMPLICATIONS: None immediate. PROCEDURE: Informed written consent was obtained from the patient after a discussion of the risks, benefits and alternatives to treatment. A timeout was performed prior to the initiation of the procedure. Initial ultrasound scanning demonstrates a large amount of ascites within the right lower abdominal quadrant. The right lower abdomen was prepped and draped in the usual sterile fashion. 1% lidocaine with epinephrine was used for local anesthesia. Following this, a 19 gauge, 7-cm, Yueh catheter was introduced. An ultrasound  image was saved for documentation purposes. The paracentesis was performed. The catheter was removed and a dressing was applied. The patient tolerated the procedure well without immediate post procedural complication. FINDINGS: A total of approximately 2.45 L of clear yellow fluid was removed. IMPRESSION: Successful ultrasound-guided paracentesis yielding 2.45 liters of peritoneal fluid. Read by: Brayton El PA-C Electronically Signed   By: Gilmer Mor D.O.   On: 10/01/2015 16:34    Scheduled Meds: . fluticasone furoate-vilanterol  1 puff Inhalation Daily  .  furosemide  40 mg Oral BID  . lactulose  20 g Oral TID  . pantoprazole  40 mg Oral Daily  . sodium chloride flush  3 mL Intravenous Q12H  . spironolactone  25 mg Oral BID   Continuous Infusions: . sodium chloride 10 mL/hr at 09/26/15 0737     Time spent: 25 min    Carmelina Balducci A  [facility] Pager [phone]. If 7PM-7AM, please contact night-coverage at [email or web address], Office  [phone]  password [email or web address] 10/03/2015, 10:27 AM  LOS: 10 days

## 2015-10-03 NOTE — Consult Note (Signed)
Consultation Note Date: 10/03/2015   Patient Name: Roy Case  DOB: 1965/03/20  MRN: [id number]  Age / Sex: 50 y.o., male  PCP: Veryl Speak, FNP Referring Physician: Alba Cory, MD  Reason for Consultation: Establishing goals of care  HPI/Patient Profile: 50 y.o. 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  He was admitted on 09/23/2015 with vomiting and diarrhea felt to be secondary to viral gastroenteritis.  Roy Case lives alone.  He never married.  He is on 4 liters of oxygen at baseline.  He health began to decline dramatically in March when he was hospitalized with pneumonia.  At this point he is eating very little, and able to stand and pivot only with assistance.   He requires lactulose daily to keep hepatic encephalopathy at bay.  Clinical Assessment and Goals of Care: This consult is for review of medical treatment options, clarification of goals of care and end of life issues, and symptom management recommendations.  I have reviewed medical records, assessed the patient and then meet at the patient's bedside along with his family [name], Dennis, and [place])  to discuss diagnosis prognosis, GOC, EOL wishes disposition and options.  Values and goals of care important to the patient were elicited.  Advanced directives, concepts specific to code status, artifical feeding and hydration, continued IV antibiotics and rehospitalization were discussed.  The difference between aggressive medical intervention and comfort care was considered in light of the patient's goals of care.  Concepts of Hospice and Palliative Care as well as natural disease trajectory and expectations at EOL were discussed.  Questions and concerns were addressed.   The family was encouraged to call with questions or concerns.  PMT will continue to support holistically.   HCPOA:  Terance Hart and [name] are joint HCPOA.  The patient is making decisions with them as he has a significant degree of anxiety.    SUMMARY OF RECOMMENDATIONS    DNR / DNI  Continue lactulose, diuretics, regular pleurx cath drainage, and paracentesis for comfort.  Xanax for anxiety and sleep  Patient and his HCPOAs agree on a comfort approach  - no aggressive measures & no return to the hospital unless he can not be made comfortable.  Despite comfort approach patient and family desire Physical and Occupational therapy for strengthening.  Paz's goal is to live comfortably as long as he can as well as he can.  MOST form completed:  DNR, Comfort Measures, antibiotics if indicated, No PEG.  Patient will be hospice appropriate when he finishes acute rehab - if he chooses to engage their services.   Please request in D/C summary Palliative Services to follow at SNF.    Psycho-social/Spiritual:   Desire for further Chaplaincy support:yes  Additional Recommendations: Caregiving  Support/Resources  Prognosis:   < 3 months given recurrent hepatic encephalopathy, End stage liver disease, and recurrent ascites.  Discharge Planning: Skilled Nursing Facility  for rehab with Palliative care service follow-up      Primary Diagnoses: Present on Admission:  . Syncope and collapse . Thrombocytopenia (HCC) . Pleural effusion associated with hepatic disorder . Hepatic cirrhosis (HCC) . Chronic respiratory failure with hypoxia (HCC)  I have reviewed the medical record, interviewed the patient and family, and examined the patient. The following aspects are pertinent.  Past Medical History  Diagnosis Date  . Hypertension   . Allergic rhinitis   . Asthma   . High cholesterol   . Cirrhosis (HCC)   . 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 History of home  oxygen therapy     4 liters all the time   Social History   Social History  . Marital Status: Single    Spouse Name: N/A  . Number of Children: N/A  . Years of Education: N/A   Social History Main Topics  . Smoking status: Never Smoker   . Smokeless tobacco: Never Used  . Alcohol Use: No  . Drug Use: No  . Sexual Activity: Not Currently   Other Topics Concern  . None   Social History Narrative   Family History  Problem Relation Age of Onset  . Lung cancer Mother   . Colon cancer Neg Hx   . Esophageal cancer Neg Hx   . Pancreatic cancer Neg Hx   . Kidney disease Neg Hx   . Liver disease Neg Hx    Scheduled Meds: . fluticasone furoate-vilanterol  1 puff Inhalation Daily  . furosemide  40 mg Oral BID  . lactulose  20 g Oral TID  . pantoprazole  40 mg Oral Daily  . rifaximin  550 mg Oral BID  . sodium chloride flush  3 mL Intravenous Q12H  . spironolactone  25 mg Oral BID   Continuous Infusions: . sodium chloride 10 mL/hr at 09/26/15 0737   PRN Meds:.albuterol, ALPRAZolam, levalbuterol, ondansetron **OR** ondansetron (ZOFRAN) IV, oxyCODONE, sodium chloride Medications Prior to Admission:  Prior to Admission medications   Medication Sig Start Date End Date Taking? Authorizing Provider  albuterol (PROVENTIL HFA;VENTOLIN HFA) 108 (90 Base) MCG/ACT inhaler Inhale 2 puffs into the lungs every 6 (six) hours as needed for wheezing or shortness of breath. 06/06/15  Yes Lupita Leashouglas B McQuaid, MD  albuterol (PROVENTIL) (2.5 MG/3ML) 0.083% nebulizer solution Take 3 mLs (2.5 mg total) by nebulization every 4 (four) hours as needed for wheezing or shortness of breath. 05/03/15  Yes Veryl SpeakGregory D Calone, FNP  ALPRAZolam Prudy Feeler(XANAX) 0.5 MG tablet Take 0.5 mg by mouth 2 (two) times daily as needed for anxiety.    Yes Historical Provider, MD  fluticasone furoate-vilanterol (BREO ELLIPTA) 100-25 MCG/INH AEPB Inhale 1 puff into the lungs daily. Reported on 05/16/2015 05/16/15  Yes Bernadene PersonKathryn A Whiteheart, NP    furosemide (LASIX) 40 MG tablet Take 1 tablet (40 mg total) by mouth 2 (two) times daily. 09/20/15  Yes Veryl SpeakGregory D Calone, FNP  ibuprofen (ADVIL,MOTRIN) 800 MG tablet TK 1 T PO TID PRN PAIN 07/27/15  Yes Historical Provider, MD  OXYGEN Inhale 4.5-5 L into the lungs continuous.    Yes Historical Provider, MD  pantoprazole (PROTONIX) 40 MG tablet Take 40 mg by mouth daily.   Yes Historical Provider, MD  polyethylene glycol (MIRALAX / GLYCOLAX) packet Take 17 g by mouth daily as needed for mild constipation. 08/21/15  Yes Zannie CovePreetha Joseph, MD  potassium chloride SA (K-DUR,KLOR-CON) 20 MEQ tablet Take 20 mEq by mouth 2 (two) times daily.  Yes Historical Provider, MD  spironolactone (ALDACTONE) 25 MG tablet Take 25 mg by mouth 2 (two) times daily.   Yes Historical Provider, MD  codeine 30 MG tablet Take 0.5-1 tablets (15-30 mg total) by mouth every 6 (six) hours as needed. 08/27/15   Veryl Speak, FNP   Allergies  Allergen Reactions  . Azithromycin Other (See Comments)    Blisters on mouth  . Cefdinir Other (See Comments)    Blisters in mouth   . Penicillins Anaphylaxis and Other (See Comments)    Has patient had a PCN reaction causing immediate rash, facial/tongue/throat swelling, SOB or lightheadedness with hypotension: YES Has patient had a PCN reaction causing severe rash involving mucus membranes or skin necrosis:NO Has patient had a PCN reaction occurring within the last 10 years: NO If all of the above answers are "NO", then may proceed with Cephalosporin use.  . Acetaminophen Other (See Comments)    Reduced liver function   . Levofloxacin Itching    Itching around iv site, tingling of mouth  . Doxycycline Rash   Review of Systems :  Complaints of flank pain, nausea, decreased appetite, anxiety  Physical Exam: Thin chronically ill appearing male, A&O NAD. CV rrr Resp:  nad Abdomen:  Soft, nt Ext:  Able to move all 4  Vital Signs: BP 102/52 mmHg  Pulse 76  Temp(Src) 98.2 F (36.8  C) (Oral)  Resp 16  Ht 5\' 9"  (1.753 m)  Wt 81.2 kg (179 lb 0.2 oz)  BMI 26.42 kg/m2  SpO2 94% Pain Assessment: No/denies pain   Pain Score: 0-No pain   SpO2: SpO2: 94 % O2 Device:SpO2: 94 % O2 Flow Rate: .O2 Flow Rate (L/min): 3 L/min  IO: Intake/output summary:  Intake/Output Summary (Last 24 hours) at 10/03/15 1556 Last data filed at 10/02/15 1700  Gross per 24 hour  Intake    360 ml  Output      0 ml  Net    360 ml    LBM: Last BM Date: 10/02/15 Baseline Weight: Weight: 78.019 kg (172 lb) Most recent weight: Weight: 81.2 kg (179 lb 0.2 oz)     Palliative Assessment/Data:   Flowsheet Rows        Most Recent Value   Intake Tab    Referral Department  Hospitalist   Unit at Time of Referral  Med/Surg Unit   Palliative Care Primary Diagnosis  Other (Comment)   Date Notified  10/02/15   Palliative Care Type  New Palliative care   Reason for referral  Clarify Goals of Care   Date of Admission  09/23/15   Date first seen by Palliative Care  10/03/15   # of days Palliative referral response time  1 Day(s)   # of days IP prior to Palliative referral  9   Clinical Assessment    Palliative Performance Scale Score  40%   Anxiety Max Last 24 Hours  7   Anxiety Min Last 24 Hours  3   Psychosocial & Spiritual Assessment    Palliative Care Outcomes    Patient/Family meeting held?  Yes   Who was at the meeting?  patient, cousin [name], cousin [name].   [name] and [name] are [facility]   Palliative Care Outcomes  Improved non-pain symptom therapy, Clarified goals of care, Changed to focus on comfort   Patient/Family wishes: Interventions discontinued/not started   PEG, Tube feedings/TPN, Mechanical Ventilation      Time In: 1:30 Time Out: 3:30 Time  Total: 120 Greater than 50%  of this time was spent counseling and coordinating care related to the above assessment and plan. Plan discussed with family, patient, Bedside RN, Case Management.  Signed by: Algis Downs, PA-C Palliative Medicine Pager: [phone]   Please contact Palliative Medicine Team phone at [phone] for questions and concerns.  For individual provider: See Loretha Stapler

## 2015-10-03 NOTE — Progress Notes (Signed)
Desat to 90 on 2L, upped it to 3L. Now it is 93%.

## 2015-10-03 NOTE — Care Management Note (Signed)
Case Management Note  Patient Details  Name: Volney Presserracey L Valverde MRN: [id number] Date of Birth: 13-Jan-1966  Subjective/Objective:                 Patient from home alone. Admitted with ascites, encephalopathy, syncope. LOS day 10, transferred from [facility]. Palliative consult today, patient does not meet criteria for residential hospice, patient does not have home support for home hospice. Patient has pleurex drain for comfort and will require paracentesis for comfort as disease progresses. Per [place] CSW, SNF declined by [employer]. CM referred to physician advisor Dr Jacky KindleAronson for assistance with DC planning.   Action/Plan:   Expected Discharge Date:                  Expected Discharge Plan:   (to be determined)  In-House Referral:  Clinical Social Work  Discharge planning Services  CM Consult  Post Acute Care Choice:    Choice offered to:     DME Arranged:    DME Agency:     HH Arranged:    HH Agency:     Status of Service:  In process, will continue to follow  If discussed at Long Length of Stay Meetings, dates discussed:    Additional Comments:  Lawerance SabalDebbie Kalin Amrhein, RN 10/03/2015, 3:29 PM

## 2015-10-04 DIAGNOSIS — K7290 Hepatic failure, unspecified without coma: Secondary | ICD-10-CM

## 2015-10-04 LAB — COMPREHENSIVE METABOLIC PANEL
ALBUMIN: 2.1 g/dL — AB (ref 3.5–5.0)
ALK PHOS: 58 U/L (ref 38–126)
ALT: 33 U/L (ref 17–63)
ANION GAP: 5 (ref 5–15)
AST: 78 U/L — ABNORMAL HIGH (ref 15–41)
BUN: 7 mg/dL (ref 6–20)
CALCIUM: 8.2 mg/dL — AB (ref 8.9–10.3)
CO2: 26 mmol/L (ref 22–32)
Chloride: 105 mmol/L (ref 101–111)
Creatinine, Ser: 0.65 mg/dL (ref 0.61–1.24)
GFR calc Af Amer: >60 mL/min (ref >60)
GFR calc non Af Amer: >60 mL/min (ref >60)
GLUCOSE: 90 mg/dL (ref 65–99)
Potassium: 3.6 mmol/L (ref 3.5–5.1)
SODIUM: 136 mmol/L (ref 135–145)
Total Bilirubin: 2.2 mg/dL — ABNORMAL HIGH (ref 0.3–1.2)
Total Protein: 6.1 g/dL — ABNORMAL LOW (ref 6.5–8.1)

## 2015-10-04 LAB — PH, BODY FLUID: PH, BODY FLUID: 7.6

## 2015-10-04 LAB — AMMONIA: Ammonia: 19 umol/L (ref 9–35)

## 2015-10-04 LAB — CBC
HCT: 35.5 % — ABNORMAL LOW (ref 39.0–52.0)
HEMOGLOBIN: 12.1 g/dL — AB (ref 13.0–17.0)
MCH: 36.8 pg — ABNORMAL HIGH (ref 26.0–34.0)
MCHC: 34.1 g/dL (ref 30.0–36.0)
MCV: 107.9 fL — ABNORMAL HIGH (ref 78.0–100.0)
Platelets: 81 10*3/uL — ABNORMAL LOW (ref 150–400)
RBC: 3.29 MIL/uL — AB (ref 4.22–5.81)
RDW: 15.4 % (ref 11.5–15.5)
WBC: 4.7 10*3/uL (ref 4.0–10.5)

## 2015-10-04 MED ORDER — POTASSIUM CHLORIDE CRYS ER 20 MEQ PO TBCR
40.0000 meq | EXTENDED_RELEASE_TABLET | Freq: Once | ORAL | Status: AC
  Administered 2015-10-04: 40 meq via ORAL
  Filled 2015-10-04: qty 2

## 2015-10-04 MED ORDER — WHITE PETROLATUM GEL
Status: AC
  Administered 2015-10-04: 0.2
  Filled 2015-10-04: qty 1

## 2015-10-04 NOTE — Progress Notes (Signed)
Daily Progress Note   Patient Name: Roy Case       Date: 10/04/2015 DOB: Jul 28, 1965  Age: 50 y.o. MRN#: [id number] Attending Physician: Alba Cory, MD Primary Care Physician: Jeanine Luz, FNP Admit Date: 09/23/2015  Reason for Consultation/Follow-up: Establishing goals of care  Subjective: Patient was having pain in his sides this am, but felt better after his pleurex was drained and he received pain medication.  He is concerned about discharge.  He has been told that his insurance won't cover SNF, but he understands that his is too debilitated to go home alone.  Patient understands he has "weeks to months" left to live.  Per his HCPOA, he has [employer] and will be eligible for Medicare on 10/16/15.  He requires regular pleurex cath drainage, paracentesis, and regimented lactulose dosing.   He is at very high risk of becoming encephalopathic and needs close monitoring.  He is unsafe to be at home alone.  Length of Stay: 11  Current Medications: Scheduled Meds:  . ALPRAZolam  0.5 mg Oral QHS  . fluticasone furoate-vilanterol  1 puff Inhalation Daily  . furosemide  40 mg Oral BID  . lactulose  20 g Oral TID  . pantoprazole  40 mg Oral Daily  . rifaximin  550 mg Oral BID  . sodium chloride flush  3 mL Intravenous Q12H  . spironolactone  25 mg Oral BID    Continuous Infusions: . sodium chloride 10 mL/hr at 09/26/15 0737    PRN Meds: albuterol, ALPRAZolam, levalbuterol, ondansetron **OR** ondansetron (ZOFRAN) IV, oxyCODONE, sodium chloride  Physical Exam       Thin, alert and orientated, NAD CV rrr Resp no distress Abdomen firm. Does not appear to have significant ascites accumulation at this time.    Vital Signs: BP 120/74 mmHg  Pulse 85  Temp(Src) 98.5 F (36.9 C)  (Oral)  Resp 16  Ht  (1.753 m)  Wt 81.2 kg (179 lb 0.2 oz)  BMI 26.42 kg/m2  SpO2 91% SpO2: SpO2: 91 % O2 Device: O2 Device: Nasal Cannula O2 Flow Rate: O2 Flow Rate (L/min): 3 L/min  Intake/output summary:  Intake/Output Summary (Last 24 hours) at 10/04/15 1400 Last data filed at 10/04/15 1036  Gross per 24 hour  Intake    220 ml  Output  275 ml  Net    -55 ml   LBM: Last BM Date: 10/04/15 Baseline Weight: Weight: 78.019 kg (172 lb) Most recent weight: Weight: 81.2 kg (179 lb 0.2 oz)       Palliative Assessment/Data:    Flowsheet Rows        Most Recent Value   Intake Tab    Referral Department  Hospitalist   Unit at Time of Referral  Med/Surg Unit   Palliative Care Primary Diagnosis  Other (Comment)   Date Notified  10/02/15   Palliative Care Type  New Palliative care   Reason for referral  Clarify Goals of Care   Date of Admission  09/23/15   Date first seen by Palliative Care  10/03/15   # of days Palliative referral response time  1 Day(s)   # of days IP prior to Palliative referral  9   Clinical Assessment    Palliative Performance Scale Score  40%   Anxiety Max Last 24 Hours  7   Anxiety Min Last 24 Hours  3   Psychosocial & Spiritual Assessment    Palliative Care Outcomes    Patient/Family meeting held?  Yes   Who was at the meeting?  patient, cousin Aurther Lofterry, cousin Doroteo Bradfordaron, Cousin Carolyn.   Aurther Lofterry and Clifton CustardAaron are [facility]   Palliative Care Outcomes  Improved non-pain symptom therapy, Clarified goals of care, Changed to focus on comfort   Patient/Family wishes: Interventions discontinued/not started   PEG, Tube feedings/TPN, Mechanical Ventilation      Patient Active Problem List   Diagnosis Date Noted  . Palliative care encounter   . Anxiety state   . Encephalopathy, hepatic (HCC)   . Goals of care, counseling/discussion   . Syncope and collapse 09/23/2015  . Orthostatic syncope   . Abdominal pain, generalized   . Lactic acid increased   .  Thrombocytopenia (HCC) 08/18/2015  . Cough with hemoptysis 08/18/2015  . Pleural effusion associated with hepatic disorder 08/18/2015  . Lower extremity edema 08/15/2015  . Numbness and tingling 07/27/2015  . 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 CAP (community acquired pneumonia) 05/03/2015  . Acute sinus infection 01/25/2015  . Chronic respiratory failure with hypoxia (HCC) 12/28/2014  . Hepatic cirrhosis (HCC) 12/28/2014    Palliative Care Assessment & Plan   Patient Profile:   Assessment: He requires regular pleurex cath drainage, paracentesis, and regimented lactulose dosing.   He is at very high risk of becoming encephalopathic and needs close monitoring.  He is unsafe to be at home alone.  Recommendations/Plan:  DNR / DNI  Continue lactulose, diuretics, regular pleurx cath drainage, and paracentesis for comfort.  Xanax for anxiety and sleep  Patient and his HCPOAs agree on a comfort approach - no aggressive measures & no return to the hospital unless he can not be made comfortable.  Despite comfort approach patient and family desire Physical and Occupational therapy for strengthening.  Pankaj's goal is to live comfortably as long as he can as well as he can.  MOST form completed: DNR, Comfort Measures, antibiotics if indicated, No PEG.  Patient will be hospice appropriate when he finishes acute rehab - if he chooses to engage their services.   Please request in D/C summary Palliative Services to follow at SNF.   Code Status: DNR  Prognosis:   <3 months given recurrent hepatic encephalopathy, End stage liver disease, and recurrent ascites.  Discharge Planning:  Skilled Nursing Facility for rehab with Palliative care service follow-up  Care plan was discussed with patient  Thank you for allowing the Palliative Medicine Team to assist in the care of this  patient.   Time In: 1:00 Time Out: 1:15 Total Time 15 min Prolonged Time Billed no      Greater than 50%  of this time was spent counseling and coordinating care related to the above assessment and plan.  Algis Downs, PA-C Palliative Medicine Pager: [phone]  Please contact Palliative Medicine Team phone at [phone] for questions and concerns.

## 2015-10-04 NOTE — Progress Notes (Addendum)
Physical Therapy Treatment Patient Details Name: Roy Case MRN: [id number] DOB: 02-05-1966 Today's Date: 10/04/2015    History of Present Illness 50 y.o. male with medical history significant for idiopathic hemachromatosis managed with phlebotomies, liver cirrhosis, chronic respiratory failure with supplemental oxygen requirement of 4-5 L/m, and pleural effusions with Pleurx catheter in the right chest presented to the ED with syncope and collapse related to dehydration,fever at home of 100.5.Paracentesis on 7/10, with 3 L, no evidence of SBP, has Pleurx cath, 500 mL drained on 7/10.  Palliative in to help pt with care and end of life decisions.    PT Comments    Surprisingly decent LE strength for inactivity over ~11 days.  SpO2 started low and took extra time to rise to 90% to be able to mobilize.  Mobility is at min guard to supervision level.  Follow Up Recommendations  Supervision/Assistance - 24 hour;SNF     Equipment Recommendations  Rolling walker with 5" wheels    Recommendations for Other Services       Precautions / Restrictions      Mobility  Bed Mobility Overal bed mobility: Modified Independent   Rolling: Supervision Sidelying to sit: Supervision       General bed mobility comments: manages well in the bed on an overlay  Transfers Overall transfer level: Needs assistance   Transfers: Sit to/from Stand;Stand Pivot Transfers Sit to Stand: Supervision         General transfer comment: supervision for safety due to dizziness and SpO2 in the 80's  Ambulation/Gait Ambulation/Gait assistance: Min assist Ambulation Distance (Feet): 25 Feet Assistive device: Rolling walker (2 wheeled) Gait Pattern/deviations: Step-through pattern Gait velocity: very slow   General Gait Details: Initially after sitting EOB post exercise pt's SpO2 at 68% HR in the 90's.  ~10 min later after pushing O2 to 6L pt atmid 80's.  Finally at 90% and 89 bpm we walked to return at  81 % walking 25 feet.   Stairs            Wheelchair Mobility    Modified Rankin (Stroke Patients Only)       Balance     Sitting balance-Leahy Scale: Good       Standing balance-Leahy Scale: Poor                      Cognition Arousal/Alertness: Lethargic;Awake/alert Behavior During Therapy: WFL for tasks assessed/performed Overall Cognitive Status: Within Functional Limits for tasks assessed                      Exercises General Exercises - Lower Extremity Hip Flexion/Marching: AROM;Strengthening;Both;15 reps (resisted flexion/extension)    General Comments        Pertinent Vitals/Pain Pain Assessment: Faces Faces Pain Scale: No hurt    Home Living                      Prior Function            PT Goals (current goals can now be found in the care plan section) Acute Rehab PT Goals Patient Stated Goal: to get stronger PT Goal Formulation: With patient Time For Goal Achievement: 10/02/15 Potential to Achieve Goals: Good Progress towards PT goals: Progressing toward goals    Frequency  Min 3X/week    PT Plan Current plan remains appropriate    Co-evaluation             End of  Session Equipment Utilized During Treatment: Oxygen Activity Tolerance: Patient limited by fatigue Patient left: in bed;with call bell/phone within reach     Time: 1255-1330 PT Time Calculation (min) (ACUTE ONLY): 35 min  Charges:  $Gait Training: 8-22 mins $Therapeutic Exercise: 8-22 mins                    G Codes:      Costella Schwarz, Eliseo GumKenneth V 10/04/2015, 1:48 PM 10/04/2015  Primghar BingKen Ronav Furney, PT [phone]  (pager)

## 2015-10-04 NOTE — Progress Notes (Signed)
[facility]  PROGRESS NOTE  Roy Case L Milhouse ONG:[id number] DOB: 1965-04-25 DOA: 09/23/2015 PCP: Jeanine Luzalone, Gregory, FNP    Brief HPI:  50 y.o. male with medical history significant for idiopathic hemachromatosis managed with phlebotomies, liver cirrhosis, chronic respiratory failure with supplemental oxygen requirement of 4-5 L/m, and pleural effusions with Pleurx catheter in the right chest presented to the ED with syncope and collapse related to dehydration. Patient has been having vomiting and diarrhea since Wednesday. He continued to have diarrhea up until 2 AM this morning. Got up to go to the bathroom and passed out. He also noted to have a low-grade fever off 100.5 yesterday. Denies any hematemesis or hematochezia. Denies any chest pain or shortness of breath. Patient had abnormal UA, was started on Bactrim empirically. Urine culture grew insignificant growth so Bactrim was discontinued. Also patient underwent paracentesis on 10/01/2015. Breathing continues to be stable on nasal cannula requiring oxygen as high as 15 L/m. Patient's healthcare power of attorney was interested in comfort measures, consulted palliative care for goals of care discussion  Active Problems:   Chronic respiratory failure with hypoxia (HCC)   Hepatic cirrhosis (HCC)   Thrombocytopenia (HCC)   Pleural effusion associated with hepatic disorder   Syncope and collapse   Palliative care encounter   Anxiety state   Encephalopathy, hepatic (HCC)   Goals of care, counseling/discussion   Assessment/Plan: 1-Syncope and collapse- resolved, likely from dehydration and volume depletion from viral gastroenteritis. Troponin negative. Echo showed EF 60-65%, grade 1 diastolic dysfunction. Restarted Lasix 40 mg twice  ,as patient now has stable blood pressure. Continue with Aldactone.  2-Diarrhea- resolved, likely from viral gastroenteritis. C. difficile is negative. GI pathogen panel is negative.  3-Liver cirrhosis with  ascites- stable, last paracentesis on 10/01/15 drained 2.5 L . Flagyl and Azactam discontinued. Lasix restarted. Ammonia increased. He report 3 BM today, continue with lactulose. Started  Rifampin 7-19..  Does not qualify for residential hospice. Evaluated by palliative. SNF trial. Needs to be follow by palliative and SNF.   4-Acute on Chronic respiratory failure with hypoxia- At some point he required 15 L of oxygen which improved after diuresis, paracentesis and pleurx cath drain.  -patient is requiring 4L,  Doesn't appear short of breath, chest x-ray is clear.   5-Hepatic encephalopathy- ammonia level is 74, on  lactulose 20 g po TID. MELS score 17, 6 % (calculated for labs 7-10 ) mortality in 3 months.  Ammonia at 19.   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 Drained 500 mL on 09/24/2015. Patient would drain once a week every Wednesday -Pleurx cath drain today 7-20.  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  8-Hypokalemia- resolved. Will provide supplement.   9-UTI- UA was positive for nitrite, started   bactrim DS 1 tab po bid, urine cultures are showing insignificant growth. Discontinued Bactrim.   DVT prophylaxis: SCD Code Status: Full code Family Communication: none at bedside.  Disposition Plan: need to be revaluated by PT, will summit again evaluation to insurance.    Consultants:  None   Procedures:  None   Antibiotics:  Anti-infectives    Start   Dose/Rate Route Frequency Ordered Stop   09/23/15 2300  aztreonam (AZACTAM) 1 g in dextrose 5 % 50 mL IVPB Status: Discontinued    1 g 100 mL/hr over 30 Minutes Intravenous Every 8 hours 09/23/15 2236 09/25/15 0718   09/23/15 2300  metroNIDAZOLE (FLAGYL) IVPB 500 mg Status: Discontinued  500 mg 100 mL/hr over 60 Minutes Intravenous Every 8 hours 09/23/15 2236 09/25/15 0718   09/23/15  1800  levofloxacin (LEVAQUIN) IVPB 750 mg Status: Discontinued    750 mg 100 mL/hr over 90 Minutes Intravenous Every 24 hours 09/23/15 1749 09/23/15 2223           Subjective: He is feeling ok, having BM.  He is willing to participate with PT>    Objective: Filed Vitals:   10/04/15 0808 10/04/15 1016 10/04/15 1025 10/04/15 1036  BP: 117/84 124/93  120/74  Pulse: 83 95  85  Temp:      TempSrc:      Resp:  16    Height:      Weight:      SpO2: 94% 88% 93% 91%    Intake/Output Summary (Last 24 hours) at 10/04/15 1413 Last data filed at 10/04/15 1036  Gross per 24 hour  Intake    220 ml  Output    275 ml  Net    -55 ml   Filed Weights   09/23/15 1422 09/24/15 0635  Weight: 78.019 kg (172 lb) 81.2 kg (179 lb 0.2 oz)    Examination:  General exam: Appears calm and comfortable  Respiratory system: Clear to auscultation. Respiratory effort normal. Cardiovascular system: S1 & S2 heard, RRR. No JVD, murmurs, rubs, gallops or clicks. Bilateral 1+ pedal edema. Gastrointestinal system: Abdomen is nondistended, soft and nontender. No organomegaly or masses felt. Normal bowel sounds heard. Central nervous system: Alert and oriented. No focal neurological deficits. Extremities: Symmetric 5 x 5 power.  Skin: No rashes, lesions or ulcers Psychiatry: Judgement and insight appear normal. Mood & affect appropriate.    Data Reviewed: I have personally reviewed following labs and imaging studies Basic Metabolic Panel:  Recent Labs Lab 09/28/15 0320 09/30/15 2011 10/03/15 1222 10/04/15 0623  NA 132* 132* 137 136  K 3.9 4.7 3.4* 3.6  CL 102 103 105 105  CO2 23 23 28 26   GLUCOSE 92 117* 106* 90  BUN 7 9 6 7   CREATININE 0.70 0.70 0.74 0.65  CALCIUM 7.5* 7.6* 7.9* 8.2*     Recent Labs Lab 09/30/15 2011 10/04/15 0623  WBC 6.2 4.7  HGB 12.3* 12.1*  HCT 35.3* 35.5*  MCV 107.3* 107.9*  PLT 53* 81*     Recent Labs  08/02/15 2124  BNP 25.5    ProBNP  (last 3 results) No results for input(s): PROBNP in the last 8760 hours.  CBG: No results for input(s): GLUCAP in the last 168 hours.  Recent Results (from the past 240 hour(s))  C difficile quick scan w PCR reflex     Status: None   Collection Time: 09/25/15 10:23 AM  Result Value Ref Range Status   C Diff antigen NEGATIVE NEGATIVE Final   C Diff toxin NEGATIVE NEGATIVE Final   C Diff interpretation No C. difficile detected.  Final  Gastrointestinal Panel by PCR , Stool     Status: None   Collection Time: 09/25/15  5:49 PM  Result Value Ref Range Status   Campylobacter species NOT DETECTED NOT DETECTED Final   Plesimonas shigelloides NOT DETECTED NOT DETECTED Final   Salmonella species NOT DETECTED NOT DETECTED Final   Yersinia enterocolitica NOT DETECTED NOT DETECTED Final   Vibrio species NOT DETECTED NOT DETECTED Final   Vibrio cholerae NOT DETECTED NOT DETECTED Final   Enteroaggregative E coli (EAEC) NOT DETECTED NOT DETECTED Final   Enteropathogenic E coli (EPEC) NOT DETECTED  NOT DETECTED Final   Enterotoxigenic E coli (ETEC) NOT DETECTED NOT DETECTED Final   Shiga like toxin producing E coli (STEC) NOT DETECTED NOT DETECTED Final   E. coli O157 NOT DETECTED NOT DETECTED Final   Shigella/Enteroinvasive E coli (EIEC) NOT DETECTED NOT DETECTED Final   Cryptosporidium NOT DETECTED NOT DETECTED Final   Cyclospora cayetanensis NOT DETECTED NOT DETECTED Final   Entamoeba histolytica NOT DETECTED NOT DETECTED Final   Giardia lamblia NOT DETECTED NOT DETECTED Final   Adenovirus F40/41 NOT DETECTED NOT DETECTED Final   Astrovirus NOT DETECTED NOT DETECTED Final   Norovirus GI/GII NOT DETECTED NOT DETECTED Final   Rotavirus A NOT DETECTED NOT DETECTED Final   Sapovirus (I, II, IV, and V) NOT DETECTED NOT DETECTED Final  Urine culture     Status: Abnormal   Collection Time: 09/28/15  4:07 PM  Result Value Ref Range Status   Specimen Description URINE, CLEAN CATCH  Final   Special  Requests NONE  Final   Culture MULTIPLE SPECIES PRESENT, SUGGEST RECOLLECTION (A)  Final   Report Status 09/29/2015 FINAL  Final  Urine culture     Status: Abnormal   Collection Time: 09/30/15  8:18 PM  Result Value Ref Range Status   Specimen Description URINE, CLEAN CATCH  Final   Special Requests NONE  Final   Culture MULTIPLE SPECIES PRESENT, SUGGEST RECOLLECTION (A)  Final   Report Status 10/02/2015 FINAL  Final     Studies: No results found.  Scheduled Meds: . ALPRAZolam  0.5 mg Oral QHS  . fluticasone furoate-vilanterol  1 puff Inhalation Daily  . furosemide  40 mg Oral BID  . lactulose  20 g Oral TID  . pantoprazole  40 mg Oral Daily  . rifaximin  550 mg Oral BID  . sodium chloride flush  3 mL Intravenous Q12H  . spironolactone  25 mg Oral BID   Continuous Infusions: . sodium chloride 10 mL/hr at 09/26/15 0737     Time spent: 25 min    Burnadette Baskett A  [facility] Pager [phone]. If 7PM-7AM, please contact night-coverage at [email or web address], Office  [phone]  password [email or web address] 10/04/2015, 2:13 PM  LOS: 11 days

## 2015-10-04 NOTE — Progress Notes (Addendum)
MD spoke with [employer] this morning- they requested updated PT- CSW faxed todays PT note to [employer]- awaiting response  Merlyn LotJenna Holoman, LCSWA Clinical Social Worker [phone]

## 2015-10-05 DIAGNOSIS — F411 Generalized anxiety disorder: Secondary | ICD-10-CM

## 2015-10-05 NOTE — Progress Notes (Signed)
[facility], CSW searching for LOG placement for patient- still no beds at this time  CSW will continue to follow  Merlyn LotJenna Holoman, [facility] HospitalCSWA Clinical Social Worker [phone]

## 2015-10-05 NOTE — Progress Notes (Signed)
[facility]  PROGRESS NOTE  Roy Case ZOX:[id number] DOB: May 02, 1965 DOA: 09/23/2015 PCP: Jeanine Luz, FNP    Brief HPI:  50 y.o. male with medical history significant for idiopathic hemachromatosis managed with phlebotomies, liver cirrhosis, chronic respiratory failure with supplemental oxygen requirement of 4-5 L/m, and pleural effusions with Pleurx catheter in the right chest presented to the ED with syncope and collapse related to dehydration. Patient has been having vomiting and diarrhea since Wednesday. He continued to have diarrhea up until 2 AM this morning. Got up to go to the bathroom and passed out. He also noted to have a low-grade fever off 100.5 yesterday. Denies any hematemesis or hematochezia. Denies any chest pain or shortness of breath. Patient had abnormal UA, was started on Bactrim empirically. Urine culture grew insignificant growth so Bactrim was discontinued. Also patient underwent paracentesis on 10/01/2015. Breathing continues to be stable on nasal cannula requiring oxygen as high as 15 L/m. Patient's healthcare power of attorney was interested in comfort measures, consulted palliative care for goals of care discussion  Active Problems:   Chronic respiratory failure with hypoxia (HCC)   Hepatic cirrhosis (HCC)   Thrombocytopenia (HCC)   Pleural effusion associated with hepatic disorder   Syncope and collapse   Palliative care encounter   Anxiety state   Encephalopathy, hepatic (HCC)   Goals of care, counseling/discussion   Assessment/Plan: 1-Syncope and collapse- resolved, likely from dehydration and volume depletion from viral gastroenteritis. Troponin negative. Echo showed EF 60-65%, grade 1 diastolic dysfunction. Restarted Lasix 40 mg twice  ,as patient now has stable blood pressure. Continue with Aldactone.  2-Diarrhea- resolved, likely from viral gastroenteritis. C. difficile is negative. GI pathogen panel is negative.  3-Liver cirrhosis with  ascites- stable, last paracentesis on 10/01/15 drained 2.5 L . Flagyl and Azactam discontinued. Lasix restarted. Ammonia increased. He report 3 BM today, continue with lactulose. Started  Rifampin 7-19..  Does not qualify for residential hospice. Evaluated by palliative. SNF trial. Needs to be follow by palliative and SNF.   4-Acute on Chronic respiratory failure with hypoxia- At some point he required 15 L of oxygen which improved after diuresis, paracentesis and pleurx cath drain.  -patient is requiring 4L,  Doesn't appear short of breath, chest x-ray is clear.   5-Hepatic encephalopathy- ammonia level is 74, on  lactulose 20 g po TID. MELS score 17, 6 % (calculated for labs 7-10 ) mortality in 3 months.  Ammonia at 19.   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 Drained 500 mL on 09/24/2015. Patient would drain once a week every Wednesday -Pleurx cath drain today 7-20.  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  8-Hypokalemia- resolved. Will provide supplement.   9-UTI- UA was positive for nitrite, started   bactrim DS 1 tab po bid, urine cultures are showing insignificant growth. Discontinued Bactrim.   DVT prophylaxis: SCD Code Status: Full code Family Communication: none at bedside.  Disposition Plan: awaiting insurance.    Consultants:  None   Procedures:  None   Antibiotics:  Anti-infectives    Start   Dose/Rate Route Frequency Ordered Stop   09/23/15 2300  aztreonam (AZACTAM) 1 g in dextrose 5 % 50 mL IVPB Status: Discontinued    1 g 100 mL/hr over 30 Minutes Intravenous Every 8 hours 09/23/15 2236 09/25/15 0718   09/23/15 2300  metroNIDAZOLE (FLAGYL) IVPB 500 mg Status: Discontinued    500 mg 100 mL/hr over 60 Minutes Intravenous  Every 8 hours 09/23/15 2236 09/25/15 0718   09/23/15 1800  levofloxacin (LEVAQUIN) IVPB 750 mg  Status: Discontinued    750 mg 100 mL/hr over 90 Minutes Intravenous Every 24 hours 09/23/15 1749 09/23/15 2223           Subjective: He is feeling ok, having BM.  Needed something for anxiety today    Objective: Filed Vitals:   10/04/15 1423 10/04/15 2108 10/05/15 0547 10/05/15 1119  BP: 109/67 111/68 106/62   Pulse: 86 89 90   Temp: 98.5 F (36.9 C) 97.8 F (36.6 C) 98.2 F (36.8 C)   TempSrc:  Oral    Resp: 19 18 18    Height:      Weight:      SpO2: 97% 94% 93% 92%    Intake/Output Summary (Last 24 hours) at 10/05/15 1414 Last data filed at 10/04/15 1528  Gross per 24 hour  Intake      0 ml  Output    500 ml  Net   -500 ml   Filed Weights   09/23/15 1422 09/24/15 0635  Weight: 78.019 kg (172 lb) 81.2 kg (179 lb 0.2 oz)    Examination:  General exam: Appears calm and comfortable  Respiratory system: Clear to auscultation. Respiratory effort normal. Cardiovascular system: S1 & S2 heard, RRR. No JVD, murmurs, rubs, gallops or clicks. Bilateral 1+ pedal edema. Gastrointestinal system: Abdomen is nondistended, soft and nontender. No organomegaly or masses felt. Normal bowel sounds heard. Central nervous system: Alert and oriented. No focal neurological deficits. Extremities: Symmetric 5 x 5 power.  Skin: No rashes, lesions or ulcers Psychiatry: Judgement and insight appear normal. Mood & affect appropriate.    Data Reviewed: I have personally reviewed following labs and imaging studies Basic Metabolic Panel:  Recent Labs Lab 09/30/15 2011 10/03/15 1222 10/04/15 0623  NA 132* 137 136  K 4.7 3.4* 3.6  CL 103 105 105  CO2 23 28 26   GLUCOSE 117* 106* 90  BUN 9 6 7   CREATININE 0.70 0.74 0.65  CALCIUM 7.6* 7.9* 8.2*     Recent Labs Lab 09/30/15 2011 10/04/15 0623  WBC 6.2 4.7  HGB 12.3* 12.1*  HCT 35.3* 35.5*  MCV 107.3* 107.9*  PLT 53* 81*     Recent Labs  08/02/15 2124  BNP 25.5    ProBNP (last 3 results) No results for  input(s): PROBNP in the last 8760 hours.  CBG: No results for input(s): GLUCAP in the last 168 hours.  Recent Results (from the past 240 hour(s))  Gastrointestinal Panel by PCR , Stool     Status: None   Collection Time: 09/25/15  5:49 PM  Result Value Ref Range Status   Campylobacter species NOT DETECTED NOT DETECTED Final   Plesimonas shigelloides NOT DETECTED NOT DETECTED Final   Salmonella species NOT DETECTED NOT DETECTED Final   Yersinia enterocolitica NOT DETECTED NOT DETECTED Final   Vibrio species NOT DETECTED NOT DETECTED Final   Vibrio cholerae NOT DETECTED NOT DETECTED Final   Enteroaggregative E coli (EAEC) NOT DETECTED NOT DETECTED Final   Enteropathogenic E coli (EPEC) NOT DETECTED NOT DETECTED Final   Enterotoxigenic E coli (ETEC) NOT DETECTED NOT DETECTED Final   Shiga like toxin producing E coli (STEC) NOT DETECTED NOT DETECTED Final   E. coli O157 NOT DETECTED NOT DETECTED Final   Shigella/Enteroinvasive E coli (EIEC) NOT DETECTED NOT DETECTED Final   Cryptosporidium NOT DETECTED NOT DETECTED Final   Cyclospora cayetanensis NOT DETECTED NOT  DETECTED Final   Entamoeba histolytica NOT DETECTED NOT DETECTED Final   Giardia lamblia NOT DETECTED NOT DETECTED Final   Adenovirus F40/41 NOT DETECTED NOT DETECTED Final   Astrovirus NOT DETECTED NOT DETECTED Final   Norovirus GI/GII NOT DETECTED NOT DETECTED Final   Rotavirus A NOT DETECTED NOT DETECTED Final   Sapovirus (I, II, IV, and V) NOT DETECTED NOT DETECTED Final  Urine culture     Status: Abnormal   Collection Time: 09/28/15  4:07 PM  Result Value Ref Range Status   Specimen Description URINE, CLEAN CATCH  Final   Special Requests NONE  Final   Culture MULTIPLE SPECIES PRESENT, SUGGEST RECOLLECTION (A)  Final   Report Status 09/29/2015 FINAL  Final  Urine culture     Status: Abnormal   Collection Time: 09/30/15  8:18 PM  Result Value Ref Range Status   Specimen Description URINE, CLEAN CATCH  Final   Special  Requests NONE  Final   Culture MULTIPLE SPECIES PRESENT, SUGGEST RECOLLECTION (A)  Final   Report Status 10/02/2015 FINAL  Final     Studies: No results found.  Scheduled Meds: . ALPRAZolam  0.5 mg Oral QHS  . fluticasone furoate-vilanterol  1 puff Inhalation Daily  . furosemide  40 mg Oral BID  . lactulose  20 g Oral TID  . pantoprazole  40 mg Oral Daily  . rifaximin  550 mg Oral BID  . sodium chloride flush  3 mL Intravenous Q12H  . spironolactone  25 mg Oral BID   Continuous Infusions: . sodium chloride 10 mL/hr at 09/26/15 0737     Time spent: 25 min    Phill Steck A  [facility] Pager [phone]. If 7PM-7AM, please contact night-coverage at [email or web address], Office  [phone]  password [email or web address] 10/05/2015, 2:14 PM  LOS: 12 days

## 2015-10-06 ENCOUNTER — Inpatient Hospital Stay (HOSPITAL_COMMUNITY): Payer: BLUE CROSS/BLUE SHIELD

## 2015-10-06 LAB — BASIC METABOLIC PANEL
ANION GAP: 5 (ref 5–15)
BUN: 6 mg/dL (ref 6–20)
CALCIUM: 8 mg/dL — AB (ref 8.9–10.3)
CHLORIDE: 102 mmol/L (ref 101–111)
CO2: 27 mmol/L (ref 22–32)
CREATININE: 0.69 mg/dL (ref 0.61–1.24)
GFR calc non Af Amer: >60 mL/min (ref >60)
Glucose, Bld: 92 mg/dL (ref 65–99)
Potassium: 3.7 mmol/L (ref 3.5–5.1)
SODIUM: 134 mmol/L — AB (ref 135–145)

## 2015-10-06 MED ORDER — DEXTROSE 5 % IV SOLN
1.0000 g | Freq: Three times a day (TID) | INTRAVENOUS | Status: DC
  Administered 2015-10-06 – 2015-10-09 (×9): 1 g via INTRAVENOUS
  Filled 2015-10-06 (×11): qty 1

## 2015-10-06 MED ORDER — FUROSEMIDE 10 MG/ML IJ SOLN
40.0000 mg | Freq: Two times a day (BID) | INTRAMUSCULAR | Status: DC
Start: 2015-10-06 — End: 2015-10-09
  Administered 2015-10-06 – 2015-10-09 (×7): 40 mg via INTRAVENOUS
  Filled 2015-10-06 (×7): qty 4

## 2015-10-06 NOTE — Progress Notes (Signed)
[facility]  PROGRESS NOTE  FLORIAN CHAUCA AOZ:[id number] DOB: 1965-12-29 DOA: 09/23/2015 PCP: Jeanine Luz, FNP    Brief HPI:  50 y.o. male with medical history significant for idiopathic hemachromatosis managed with phlebotomies, liver cirrhosis, chronic respiratory failure with supplemental oxygen requirement of 4-5 L/m, and pleural effusions with Pleurx catheter in the right chest presented to the ED with syncope and collapse related to dehydration. Patient has been having vomiting and diarrhea since Wednesday. He continued to have diarrhea up until 2 AM this morning. Got up to go to the bathroom and passed out. He also noted to have a low-grade fever off 100.5 yesterday. Denies any hematemesis or hematochezia. Denies any chest pain or shortness of breath. Patient had abnormal UA, was started on Bactrim empirically. Urine culture grew insignificant growth so Bactrim was discontinued. Also patient underwent paracentesis on 10/01/2015. Breathing continues to be stable on nasal cannula requiring oxygen as high as 15 L/m. Patient's healthcare power of attorney was interested in comfort measures, consulted palliative care for goals of care discussion  Active Problems:   Chronic respiratory failure with hypoxia (HCC)   Hepatic cirrhosis (HCC)   Thrombocytopenia (HCC)   Pleural effusion associated with hepatic disorder   Syncope and collapse   Palliative care encounter   Anxiety state   Encephalopathy, hepatic (HCC)   Goals of care, counseling/discussion   Assessment/Plan: 1-Syncope and collapse- resolved, likely from dehydration and volume depletion from viral gastroenteritis. Troponin negative. Echo showed EF 60-65%, grade 1 diastolic dysfunction. Restarted Lasix 40 mg twice  ,as patient now has stable blood pressure. Continue with Aldactone.  2-Diarrhea- resolved, likely from viral gastroenteritis. C. difficile is negative. GI pathogen panel is negative.  3-Liver cirrhosis with  ascites- stable, last paracentesis on 10/01/15 drained 2.5 L . Flagyl and Azactam discontinued. Lasix restarted. Ammonia increased. He report 3 BM today, continue with lactulose. Started  Rifampin 7-19..  Does not qualify for residential hospice. Evaluated by palliative. SNF trial. Needs to be follow by palliative and SNF.   4-Acute on Chronic respiratory failure with hypoxia- At some point he required 15 L of oxygen which improved after diuresis, paracentesis and pleurx cath drain.  Overnight became hypoxic.  -will get chest x ray.  -will drain pleurx cath.  -Change IV lasix to IV.  -IR consulted for paracentesis.   5-Hepatic encephalopathy- ammonia level is 74, on  lactulose 20 g po TID. MELS score 17, 6 % (calculated for labs 7-10 ) mortality in 3 months.  Ammonia at 19.   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 Drained 500 mL on 09/24/2015. Patient would drain once a week every Wednesday -Pleurx cath drain today 7-20.  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  8-Hypokalemia- resolved. Will provide supplement.   9-UTI- UA was positive for nitrite, started   bactrim DS 1 tab po bid, urine cultures are showing insignificant growth. Discontinued Bactrim.   DVT prophylaxis: SCD Code Status: Full code Family Communication: none at bedside.  Disposition Plan: awaiting insurance.    Consultants:  None   Procedures:  None   Antibiotics:  Anti-infectives    Start   Dose/Rate Route Frequency Ordered Stop   09/23/15 2300  aztreonam (AZACTAM) 1 g in dextrose 5 % 50 mL IVPB Status: Discontinued    1 g 100 mL/hr over 30 Minutes Intravenous Every 8 hours 09/23/15 2236 09/25/15 0718   09/23/15 2300  metroNIDAZOLE (FLAGYL) IVPB 500 mg Status: Discontinued  500 mg 100 mL/hr over 60 Minutes Intravenous Every 8 hours 09/23/15 2236 09/25/15 0718    09/23/15 1800  levofloxacin (LEVAQUIN) IVPB 750 mg Status: Discontinued    750 mg 100 mL/hr over 90 Minutes Intravenous Every 24 hours 09/23/15 1749 09/23/15 2223           Subjective: He si alert, denies worsening dyspnea.  became hypoxic overnight.     Objective: Filed Vitals:   10/05/15 2218 10/06/15 0522 10/06/15 0751 10/06/15 0807  BP: 114/57 114/67    Pulse: 103 99 106 108  Temp: 98.3 F (36.8 C) 97.9 F (36.6 C)    TempSrc: Oral Oral    Resp: 18 18 16 18   Height:      Weight:      SpO2: 90% 75% 84% 92%    Intake/Output Summary (Last 24 hours) at 10/06/15 0847 Last data filed at 10/05/15 1921  Gross per 24 hour  Intake    340 ml  Output      0 ml  Net    340 ml   Filed Weights   09/23/15 1422 09/24/15 0635  Weight: 78.019 kg (172 lb) 81.2 kg (179 lb 0.2 oz)    Examination:  General exam: Appears calm and comfortable  Respiratory system: Respiratory effort normal. Decrease breath sounds.  Cardiovascular system: S1 & S2 heard, RRR. No JVD, murmurs, rubs, gallops or clicks. Bilateral 1+ pedal edema. Gastrointestinal system: Abdomen is nondistended, soft and nontender. No organomegaly or masses felt. Normal bowel sounds heard. Central nervous system: Alert and oriented. No focal neurological deficits. Extremities: Symmetric 5 x 5 power.  Skin: No rashes, lesions or ulcers Psychiatry: Judgement and insight appear normal. Mood & affect appropriate.    Data Reviewed: I have personally reviewed following labs and imaging studies Basic Metabolic Panel:  Recent Labs Lab 09/30/15 2011 10/03/15 1222 10/04/15 0623  NA 132* 137 136  K 4.7 3.4* 3.6  CL 103 105 105  CO2 23 28 26   GLUCOSE 117* 106* 90  BUN 9 6 7   CREATININE 0.70 0.74 0.65  CALCIUM 7.6* 7.9* 8.2*     Recent Labs Lab 09/30/15 2011 10/04/15 0623  WBC 6.2 4.7  HGB 12.3* 12.1*  HCT 35.3* 35.5*  MCV 107.3* 107.9*  PLT 53* 81*     Recent Labs  08/02/15 2124  BNP 25.5     ProBNP (last 3 results) No results for input(s): PROBNP in the last 8760 hours.  CBG: No results for input(s): GLUCAP in the last 168 hours.  Recent Results (from the past 240 hour(s))  Urine culture     Status: Abnormal   Collection Time: 09/28/15  4:07 PM  Result Value Ref Range Status   Specimen Description URINE, CLEAN CATCH  Final   Special Requests NONE  Final   Culture MULTIPLE SPECIES PRESENT, SUGGEST RECOLLECTION (A)  Final   Report Status 09/29/2015 FINAL  Final  Urine culture     Status: Abnormal   Collection Time: 09/30/15  8:18 PM  Result Value Ref Range Status   Specimen Description URINE, CLEAN CATCH  Final   Special Requests NONE  Final   Culture MULTIPLE SPECIES PRESENT, SUGGEST RECOLLECTION (A)  Final   Report Status 10/02/2015 FINAL  Final     Studies: No results found.  Scheduled Meds: . ALPRAZolam  0.5 mg Oral QHS  . fluticasone furoate-vilanterol  1 puff Inhalation Daily  . furosemide  40 mg Intravenous Q12H  . lactulose  20 g Oral  TID  . pantoprazole  40 mg Oral Daily  . rifaximin  550 mg Oral BID  . sodium chloride flush  3 mL Intravenous Q12H  . spironolactone  25 mg Oral BID   Continuous Infusions: . sodium chloride 10 mL/hr at 09/26/15 0737     Time spent: 25 min    Arva Slaugh A  [facility] Pager [phone]. If 7PM-7AM, please contact night-coverage at [email or web address], Office  [phone]  password [email or web address] 10/06/2015, 8:47 AM  LOS: 13 days

## 2015-10-06 NOTE — Progress Notes (Signed)
Pt on 6 L [place], sat 84%. RN notified. Pt placed on venturi mask 14 L 55%, sat improved to 92%.

## 2015-10-06 NOTE — Progress Notes (Signed)
O2 sats dropping this AM to 80%. Respiratory applied face mask and turned 02 up to 15. Sats up to 92%. Per Dr. Sunnie Nielsen, drained pleurex and was able to suction out . Drain tube appears to have been stitched but is no longer stitched. Site assessment includes some erythema around the insertion site but no drainage noted on bandage that was removed. Will continue to monitor as needed.

## 2015-10-06 NOTE — Progress Notes (Signed)
Pharmacy Antibiotic Note  Roy Case is a 50 y.o. male admitted on 09/23/2015 with dehydration and UTI. Now with recurrent fever,  Pharmacy has been consulted for aztreonam dosing.  Plan: aztreonam 1g IV q8h Follow for gram positive coverage   Height: 5\' 9"  (175.3 cm) Weight: 179 lb 0.2 oz (81.2 kg) IBW/kg (Calculated) : 70.7  Temp (24hrs), Avg:98.9 F (37.2 C), Min:97.9 F (36.6 C), Max:100.5 F (38.1 C)   Recent Labs Lab 09/30/15 2011 10/03/15 1222 10/04/15 0623 10/06/15 0910  WBC 6.2  --  4.7  --   CREATININE 0.70 0.74 0.65 0.69    Estimated Creatinine Clearance: 111.7 mL/min (by C-G formula based on Cr of 0.69).    Allergies  Allergen Reactions  . Azithromycin Other (See Comments)    Blisters on mouth  . Cefdinir Other (See Comments)    Blisters in mouth   . Penicillins Anaphylaxis and Other (See Comments)    Has patient had a PCN reaction causing immediate rash, facial/tongue/throat swelling, SOB or lightheadedness with hypotension: YES Has patient had a PCN reaction causing severe rash involving mucus membranes or skin necrosis:NO Has patient had a PCN reaction occurring within the last 10 years: NO If all of the above answers are "NO", then may proceed with Cephalosporin use.  . Acetaminophen Other (See Comments)    Reduced liver function   . Levofloxacin Itching    Itching around iv site, tingling of mouth  . Doxycycline Rash    Antimicrobials this admission: Azactam 7/9 >> 7/11; 7/22>> Flagyl 7/9 >> 7/11 LVQ 7/9 > 7/9  Dose adjustments this admission: n/a  Microbiology results: 7/9 MRSA PCR - negative 7/10 peritoneal fluid - negative 7/11 C.diff PCR - negative 7/11 GI panel - negative 7/14 urine: multiple species 7/16 urine: multiple species  Thank you for allowing pharmacy to be a part of this patient's care.  Roy Case 10/06/2015 3:44 PM

## 2015-10-07 ENCOUNTER — Inpatient Hospital Stay (HOSPITAL_COMMUNITY): Payer: BLUE CROSS/BLUE SHIELD

## 2015-10-07 DIAGNOSIS — J9621 Acute and chronic respiratory failure with hypoxia: Secondary | ICD-10-CM

## 2015-10-07 LAB — CBC
HCT: 34.7 % — ABNORMAL LOW (ref 39.0–52.0)
HEMOGLOBIN: 11.7 g/dL — AB (ref 13.0–17.0)
MCH: 36.4 pg — ABNORMAL HIGH (ref 26.0–34.0)
MCHC: 33.7 g/dL (ref 30.0–36.0)
MCV: 108.1 fL — ABNORMAL HIGH (ref 78.0–100.0)
PLATELETS: 70 10*3/uL — AB (ref 150–400)
RBC: 3.21 MIL/uL — AB (ref 4.22–5.81)
RDW: 15.1 % (ref 11.5–15.5)
WBC: 5.4 10*3/uL (ref 4.0–10.5)

## 2015-10-07 LAB — BASIC METABOLIC PANEL
ANION GAP: 4 — AB (ref 5–15)
BUN: 8 mg/dL (ref 6–20)
CHLORIDE: 102 mmol/L (ref 101–111)
CO2: 28 mmol/L (ref 22–32)
Calcium: 7.9 mg/dL — ABNORMAL LOW (ref 8.9–10.3)
Creatinine, Ser: 0.69 mg/dL (ref 0.61–1.24)
GFR calc Af Amer: >60 mL/min (ref >60)
Glucose, Bld: 103 mg/dL — ABNORMAL HIGH (ref 65–99)
POTASSIUM: 3.5 mmol/L (ref 3.5–5.1)
SODIUM: 134 mmol/L — AB (ref 135–145)

## 2015-10-07 MED ORDER — LIDOCAINE HCL 1 % IJ SOLN
INTRAMUSCULAR | Status: AC
  Filled 2015-10-07: qty 20

## 2015-10-07 MED ORDER — POTASSIUM CHLORIDE CRYS ER 20 MEQ PO TBCR
40.0000 meq | EXTENDED_RELEASE_TABLET | Freq: Once | ORAL | Status: AC
  Administered 2015-10-07: 40 meq via ORAL

## 2015-10-07 NOTE — Progress Notes (Signed)
[facility]  Received request from Lupita Leash, CSW for family interest in placement at [facility].  Spoke with [name] via phone conversation to confirm interest.  Unfortunately, there is no bed availability today.  Will continue to follow if bed becomes available and patient eligible per hospice MD.   Thank you, Hessie Knows RN, BSN [phone]

## 2015-10-07 NOTE — Clinical Social Work Note (Signed)
CSW spoke in detail with patient's Health Care Power or [employer]- [name] this afternoon to discuss current plan to seek short term SNF for rehab and then seek SNF.  BCBS is under appeal however documentation indicates that patient will be hospice appropriate after rehab attempt.  CSW spoke with Wellsite geologist and approval was given to seek a letter of guarantee bed in [place], [place], [place], [place], [place] or [place].  HCPOA is adamant that they will not allow patient to be placed outside of the area and refuse all possible LOG beds.  He verbalized that patient wants time to see all of his family before he dies and thus would prefer to seek residential hospice home. There is no family or friends to provide home care for patient per HCPOA. He agrees to a referral to residential hospital with [facility] being first choice and [facility] as second choice.  He will also discuss with [name]- patient's other HCPOA.   Patient is a DNR and current has a pleurex drain in place.  He is not on 6L of oxygen. Active referrals completed to [facility] and Delice Bison at [facility].  Lorri Frederick. Jaci Lazier, LCSW [phone] (weekend coverage)

## 2015-10-07 NOTE — Progress Notes (Signed)
[facility]  PROGRESS NOTE  CHANSE KAGEL ZOX:[id number] DOB: [birth year] DOA: 09/23/2015 PCP: Jeanine Luz, FNP    Brief HPI:  50 y.o. male with medical history significant for idiopathic hemachromatosis managed with phlebotomies, liver cirrhosis, chronic respiratory failure with supplemental oxygen requirement of 4-5 L/m, and pleural effusions with Pleurx catheter in the right chest presented to the ED with syncope and collapse related to dehydration. Patient has been having vomiting and diarrhea since Wednesday. He continued to have diarrhea up until 2 AM this morning. Got up to go to the bathroom and passed out. He also noted to have a low-grade fever off 100.5 yesterday. Denies any hematemesis or hematochezia. Denies any chest pain or shortness of breath. Patient had abnormal UA, was started on Bactrim empirically. Urine culture grew insignificant growth so Bactrim was discontinued. Also patient underwent paracentesis on 10/01/2015. Breathing continues to be stable on nasal cannula requiring oxygen as high as 15 L/m. Patient's healthcare power of attorney was interested in comfort measures, consulted palliative care for goals of care discussion  Active Problems:   Chronic respiratory failure with hypoxia (HCC)   Hepatic cirrhosis (HCC)   Thrombocytopenia (HCC)   Pleural effusion associated with hepatic disorder   Syncope and collapse   Palliative care encounter   Anxiety state   Encephalopathy, hepatic (HCC)   Goals of care, counseling/discussion   Assessment/Plan: 1-Syncope and collapse- resolved, likely from dehydration and volume depletion from viral gastroenteritis. Troponin negative. Echo showed EF 60-65%, grade 1 diastolic dysfunction. Restarted Lasix 40 mg twice  ,as patient now has stable blood pressure. Continue with Aldactone.  2-Diarrhea- resolved, likely from viral gastroenteritis. C. difficile is negative. GI pathogen panel is negative.  3-Liver cirrhosis with  ascites- stable, last paracentesis on 10/01/15 drained 2.5 L . Flagyl and Azactam discontinued. Lasix restarted. Ammonia increased. He report 3 BM today, continue with lactulose. Started  Rifampin 7-19..  Does not qualify for residential hospice. Evaluated by palliative. SNF trial. Needs to be follow by palliative and SNF.  Paracentesis 7-23   4-Acute on Chronic respiratory failure with hypoxia- At some point he required 15 L of oxygen which improved after diuresis, paracentesis and pleurx cath drain.  Overnight became hypoxic.  -chest x ray developing left pleural effusion, atelectasis or infection.  -drain pleurx cath 7-23 -Continue with IV lasix -IR consulted for paracentesis. Underwent paracentesis 7-23 Mild fever , started on aztreonam 7-22.  5-Hepatic encephalopathy- ammonia level is 74, on  lactulose 20 g po TID. MELS score 17, 6 % (calculated for labs 7-10 ) mortality in 3 months.  Ammonia at 19.   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 Drained 500 mL on 09/24/2015. Patient would drain once a week every Wednesday -Pleurx cath drain today 7-20. 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  8-Hypokalemia- resolved. Will provide supplement.   9-UTI- UA was positive for nitrite, started   bactrim DS 1 tab po bid, urine cultures are showing insignificant growth. Discontinued Bactrim.   DVT prophylaxis: SCD Code Status: Full code Family Communication: none at bedside.  Disposition Plan: awaiting insurance.    Consultants:  None   Procedures:  Paracentesis time s 2'   Antibiotics:  Anti-infectives    Start   Dose/Rate Route Frequency Ordered Stop   09/23/15 2300  aztreonam (AZACTAM) 1 g in dextrose 5 % 50 mL IVPB Status: Discontinued    1 g 100 mL/hr over 30 Minutes Intravenous Every  8 hours 09/23/15 2236 09/25/15 0718   09/23/15 2300   metroNIDAZOLE (FLAGYL) IVPB 500 mg Status: Discontinued    500 mg 100 mL/hr over 60 Minutes Intravenous Every 8 hours 09/23/15 2236 09/25/15 0718   09/23/15 1800  levofloxacin (LEVAQUIN) IVPB 750 mg Status: Discontinued    750 mg 100 mL/hr over 90 Minutes Intravenous Every 24 hours 09/23/15 1749 09/23/15 2223           Subjective: He is feeling better, requiring less amount of oxygen today     Objective: Vitals:   10/06/15 2142 10/07/15 0550 10/07/15 0823 10/07/15 0853  BP: 119/70 103/68 113/78 111/75  Pulse: 92 96 81 81  Resp: 20 18 18 18   Temp: 98.2 F (36.8 C) 98.6 F (37 C)    TempSrc: Oral Oral    SpO2: 98% 93% 94% 100%  Weight:      Height:        Intake/Output Summary (Last 24 hours) at 10/07/15 1400 Last data filed at 10/06/15 1859  Gross per 24 hour  Intake               50 ml  Output              750 ml  Net             -700 ml   Filed Weights   09/23/15 1422 09/24/15 0635  Weight: 78 kg (172 lb) 81.2 kg (179 lb 0.2 oz)    Examination:  General exam: Appears calm and comfortable  Respiratory system: Respiratory effort normal. Decrease breath sounds.  Cardiovascular system: S1 & S2 heard, RRR. No JVD, murmurs, rubs, gallops or clicks. Bilateral 1+ pedal edema. Gastrointestinal system: Abdomen is nondistended, soft and nontender. No organomegaly or masses felt. Normal bowel sounds heard. Central nervous system: Alert and oriented. No focal neurological deficits. Extremities: Symmetric 5 x 5 power.  Skin: No rashes, lesions or ulcers Psychiatry: Judgement and insight appear normal. Mood & affect appropriate.    Data Reviewed: I have personally reviewed following labs and imaging studies Basic Metabolic Panel:  Recent Labs Lab 09/30/15 2011 10/03/15 1222 10/04/15 0623 10/06/15 0910 10/07/15 0733  NA 132* 137 136 134* 134*  K 4.7 3.4* 3.6 3.7 3.5  CL 103 105 105 102 102  CO2 23 28 26 27 28   GLUCOSE 117* 106* 90 92 103*   BUN 9 6 7 6 8   CREATININE 0.70 0.74 0.65 0.69 0.69  CALCIUM 7.6* 7.9* 8.2* 8.0* 7.9*     Recent Labs Lab 09/30/15 2011 10/04/15 0623 10/07/15 0733  WBC 6.2 4.7 5.4  HGB 12.3* 12.1* 11.7*  HCT 35.3* 35.5* 34.7*  MCV 107.3* 107.9* 108.1*  PLT 53* 81* 70*     Recent Labs  08/02/15 2124  BNP 25.5    ProBNP (last 3 results) No results for input(s): PROBNP in the last 8760 hours.  CBG: No results for input(s): GLUCAP in the last 168 hours.  Recent Results (from the past 240 hour(s))  Urine culture     Status: Abnormal   Collection Time: 09/28/15  4:07 PM  Result Value Ref Range Status   Specimen Description URINE, CLEAN CATCH  Final   Special Requests NONE  Final   Culture MULTIPLE SPECIES PRESENT, SUGGEST RECOLLECTION (A)  Final   Report Status 09/29/2015 FINAL  Final  Urine culture     Status: Abnormal   Collection Time: 09/30/15  8:18 PM  Result Value Ref Range Status  Specimen Description URINE, CLEAN CATCH  Final   Special Requests NONE  Final   Culture MULTIPLE SPECIES PRESENT, SUGGEST RECOLLECTION (A)  Final   Report Status 10/02/2015 FINAL  Final     Studies: US Paracentesis  Result Date: 10/07/2015 INDICATION: Cirrhosis. Abdominal distention secondary to recurrent ascites. Request therapeutic paracentesis. EXAM: ULTRASOUND GUIDED RIGHT LOWER QUADRANT PARACENTESIS MEDICATIONS: None. COMPLICATIONS: None immediate. PROCEDURE: Informed written consent was obtained from the patient after a discussion of the risks, benefits and alternatives to treatment. A timeout was performed prior to the initiation of the procedure. Initial ultrasound scanning demonstrates a small to moderate amount of ascites within the right lower abdominal quadrant. The right lower abdomen was prepped and draped in the usual sterile fashion. 1% lidocaine with epinephrine was used for local anesthesia. Following this, a Safe-T-Centesis catheter was introduced. An ultrasound image was saved for  documentation purposes. The paracentesis was performed. The catheter was removed and a dressing was applied. The patient tolerated the procedure well without immediate post procedural complication. FINDINGS: A total of approximately 1.5 L of clear yellow fluid was removed. IMPRESSION: Successful ultrasound-guided paracentesis yielding 1.5 liters of peritoneal fluid. Read by: Brayton El PA-C Electronically Signed   By: Malachy Moan M.D.   On: 10/07/2015 09:52  Dg Chest Port 1 View  Result Date: 10/06/2015 CLINICAL DATA:  Hypoxemia. EXAM: PORTABLE CHEST 1 VIEW COMPARISON:  09/30/2015 FINDINGS: Patient rotated right. Normal heart size for level of inspiration. Possible small left pleural effusion. No pneumothorax. Development of left base airspace disease. IMPRESSION: Development of small left pleural effusion and adjacent atelectasis or infection. Electronically Signed   By: Jeronimo Greaves M.D.   On: 10/06/2015 09:27    Scheduled Meds: . ALPRAZolam  0.5 mg Oral QHS  . aztreonam  1 g Intravenous Q8H  . fluticasone furoate-vilanterol  1 puff Inhalation Daily  . furosemide  40 mg Intravenous Q12H  . lactulose  20 g Oral TID  . lidocaine      . pantoprazole  40 mg Oral Daily  . potassium chloride  40 mEq Oral Once  . rifaximin  550 mg Oral BID  . sodium chloride flush  3 mL Intravenous Q12H  . spironolactone  25 mg Oral BID   Continuous Infusions: . sodium chloride 10 mL/hr at 09/26/15 0737     Time spent: 25 min    Rhilyn Battle A  [facility] Pager [phone]. If 7PM-7AM, please contact night-coverage at [email or web address], Office  [phone]  password [email or web address] 10/07/2015, 2:00 PM  LOS: 14 days

## 2015-10-07 NOTE — Procedures (Signed)
Successful US guided paracentesis from RLQ.  Yielded 1.5L of clear yellow fluid.  No immediate complications.  Pt tolerated well.   Specimen was not sent for labs.  Brayton El PA-C 10/07/2015 9:52 AM

## 2015-10-08 DIAGNOSIS — J9621 Acute and chronic respiratory failure with hypoxia: Secondary | ICD-10-CM

## 2015-10-08 DIAGNOSIS — R531 Weakness: Secondary | ICD-10-CM

## 2015-10-08 DIAGNOSIS — R110 Nausea: Secondary | ICD-10-CM

## 2015-10-08 LAB — BASIC METABOLIC PANEL
Anion gap: 3 — ABNORMAL LOW (ref 5–15)
BUN: 8 mg/dL (ref 6–20)
CHLORIDE: 101 mmol/L (ref 101–111)
CO2: 31 mmol/L (ref 22–32)
CREATININE: 0.61 mg/dL (ref 0.61–1.24)
Calcium: 7.7 mg/dL — ABNORMAL LOW (ref 8.9–10.3)
GFR calc Af Amer: >60 mL/min (ref >60)
GLUCOSE: 97 mg/dL (ref 65–99)
POTASSIUM: 3 mmol/L — AB (ref 3.5–5.1)
SODIUM: 135 mmol/L (ref 135–145)

## 2015-10-08 MED ORDER — LACTULOSE 10 GM/15ML PO SOLN
20.0000 g | Freq: Two times a day (BID) | ORAL | Status: DC
  Administered 2015-10-09: 20 g via ORAL
  Filled 2015-10-08: qty 30

## 2015-10-08 MED ORDER — POTASSIUM CHLORIDE CRYS ER 20 MEQ PO TBCR
40.0000 meq | EXTENDED_RELEASE_TABLET | Freq: Two times a day (BID) | ORAL | Status: AC
  Administered 2015-10-08 (×2): 40 meq via ORAL
  Filled 2015-10-08 (×2): qty 2

## 2015-10-08 MED ORDER — ONDANSETRON HCL 4 MG/2ML IJ SOLN
4.0000 mg | Freq: Three times a day (TID) | INTRAMUSCULAR | Status: DC
  Administered 2015-10-08 – 2015-10-09 (×3): 4 mg via INTRAVENOUS
  Filled 2015-10-08 (×3): qty 2

## 2015-10-08 NOTE — Progress Notes (Signed)
Daily Progress Note   Patient Name: Roy Case       Date: 10/08/2015 DOB: 1965/04/29  Age: 50 y.o. MRN#: [id number] Attending Physician: Alba Cory, MD Primary Care Physician: Jeanine Luz, FNP Admit Date: 09/23/2015  Reason for Consultation/Follow-up: Disposition  Subjective: Patient complaining of nausea and weakness today.  Doesn't like SCDs. Asked by CSW if patient was residential hospice appropriate.   At this point he is not.   Patient is alert and orientated, and stable.  Spoke with [name] in the [place].  Per [name] the patient wants to get his affairs in order, see his family over the next few weeks and then go to residential hospice in [place] for a comfortable passing.  It is certainly possible that the patient will pass in the next several weeks if he has an infection or if he stops taking his medications for hepatic encephalopathy.  Length of Stay: 15  Current Medications: Scheduled Meds:  . ALPRAZolam  0.5 mg Oral QHS  . aztreonam  1 g Intravenous Q8H  . fluticasone furoate-vilanterol  1 puff Inhalation Daily  . furosemide  40 mg Intravenous Q12H  . [START ON 10/09/2015] lactulose  20 g Oral BID  . ondansetron (ZOFRAN) IV  4 mg Intravenous TID AC & HS  . pantoprazole  40 mg Oral Daily  . potassium chloride  40 mEq Oral BID  . rifaximin  550 mg Oral BID  . sodium chloride flush  3 mL Intravenous Q12H  . spironolactone  25 mg Oral BID    Continuous Infusions: . sodium chloride 10 mL/hr at 10/08/15 0821    PRN Meds: albuterol, ALPRAZolam, levalbuterol, ondansetron **OR** ondansetron (ZOFRAN) IV, oxyCODONE, sodium chloride  Physical Exam         Thin, A&O, very pleasant, chronically ill.  Initially, head covered with blanket. CV rrr Resp  nad Abdomen firm, nt Extremities no edema  Vital Signs: BP (!) 115/59 (BP Location: Left Arm)   Pulse (!) 103   Temp 99.1 F (37.3 C)   Resp 18   Ht 5\' 9"  (1.753 m)   Wt 81.2 kg (179 lb 0.2 oz)   SpO2 93%   BMI 26.44 kg/m  SpO2: SpO2: 93 % O2 Device: O2 Device: Nasal Cannula O2 Flow Rate: O2 Flow Rate (L/min): 5 L/min  Intake/output summary:  Intake/Output Summary (Last 24 hours) at 10/08/15 1606 Last data filed at 10/08/15 0419  Gross per 24 hour  Intake                0 ml  Output              300 ml  Net             -300 ml   LBM: Last BM Date: 10/08/15 Baseline Weight: Weight: 78 kg (172 lb) Most recent weight: Weight: 81.2 kg (179 lb 0.2 oz)       Palliative Assessment/Data:    Flowsheet Rows   Flowsheet Row Most Recent Value  Intake Tab  Referral Department  Hospitalist  Unit at Time of Referral  Med/Surg Unit  Palliative Care Primary Diagnosis  Other (Comment)  Date Notified  10/02/15  Palliative Care Type  New Palliative care  Reason for referral  Clarify Goals of Care  Date of Admission  09/23/15  Date first seen by Palliative Care  10/03/15  # of days Palliative referral response time  1 Day(s)  # of days IP prior to Palliative referral  9  Clinical Assessment  Palliative Performance Scale Score  40%  Anxiety Max Last 24 Hours  7  Anxiety Min Last 24 Hours  3  Psychosocial & Spiritual Assessment  Palliative Care Outcomes  Patient/Family meeting held?  Yes  Who was at the meeting?  patient, cousin [name], cousin [name].   [name] and [name] are [facility]  Palliative Care Outcomes  Improved non-pain symptom therapy, Clarified goals of care, Changed to focus on comfort  Patient/Family wishes: Interventions discontinued/not started   PEG, Tube feedings/TPN, Mechanical Ventilation      Patient Active Problem List   Diagnosis Date Noted  . Acute and chronic respiratory failure with hypoxia (HCC) 10/07/2015  . Palliative care encounter   .  Anxiety state   . Encephalopathy, hepatic (HCC)   . Goals of care, counseling/discussion   . Syncope and collapse 09/23/2015  . Orthostatic syncope   . Abdominal pain, generalized   . Lactic acid increased   . Thrombocytopenia (HCC) 08/18/2015  . Cough with hemoptysis 08/18/2015  . Pleural effusion associated with hepatic disorder 08/18/2015  . Lower extremity edema 08/15/2015  . Numbness and tingling 07/27/2015  . 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 CAP (community acquired pneumonia) 05/03/2015  . Acute sinus infection 01/25/2015  . Chronic respiratory failure with hypoxia (HCC) 12/28/2014  . Hepatic cirrhosis (HCC) 12/28/2014    Palliative Care Assessment & Plan   Patient Profile: 50 y.o. 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  He was admitted on 09/23/2015 with vomiting and diarrhea felt to be secondary to viral gastroenteritis.  [name] lives alone.  He never married.  He is on 4 liters of oxygen at baseline.  He health began to decline dramatically in March when he was hospitalized with pneumonia.  At this point he is eating very little, and able to stand and pivot only with assistance.   He requires lactulose daily to keep hepatic encephalopathy at bay.  Assessment: Patient required paracentesis again over the weekend and pleurx drainage.  He is eating little due to nausea.  He will likely require paracentesis weekly and pleurx cath  drainage 3x week.  Recommendations/Plan:  DNR  Zofran TID starting today to see if nausea resolves  Decreased lactulose to BID as patient is clear.  Will reassess in AM to ensure he is still clear minded.  D/C tele and SCDs  Patient to Discharge to SNF.     Please request  Palliative Medicine to follow at SNF.  Patient will likely need residential hospice as he gets closer to EOL.  Prognosis:   < 3 months  Discharge Planning:  Skilled Nursing Facility for rehab with Palliative care service follow-up  Care plan was discussed with patient, CSW and HCPOA [name])  Thank you for allowing the Palliative Medicine Team to assist in the care of this patient.   Time In: 4:00 Time Out: 4:45 Total Time 45 Prolonged Time Billed no      Greater than 50%  of this time was spent counseling and coordinating care related to the above assessment and plan.  Algis Downs, PA-C Palliative Medicine Pager: [phone]  Please contact Palliative Medicine Team phone at [phone] for questions and concerns.

## 2015-10-08 NOTE — Progress Notes (Signed)
[facility]  PROGRESS NOTE  Roy Case:[id number] DOB: 1965-05-24 DOA: 09/23/2015 PCP: Jeanine Luz, FNP    Brief HPI:  50 y.o. male with medical history significant for idiopathic hemachromatosis managed with phlebotomies, liver cirrhosis, chronic respiratory failure with supplemental oxygen requirement of 4-5 L/m, and pleural effusions with Pleurx catheter in the right chest presented to the ED with syncope and collapse related to dehydration. Patient has been having vomiting and diarrhea since Wednesday. He continued to have diarrhea up until 2 AM this morning. Got up to go to the bathroom and passed out. He also noted to have a low-grade fever off 100.5 yesterday. Denies any hematemesis or hematochezia. Denies any chest pain or shortness of breath. Patient had abnormal UA, was started on Bactrim empirically. Urine culture grew insignificant growth so Bactrim was discontinued. Also patient underwent paracentesis on 10/01/2015. Breathing continues to be stable on nasal cannula requiring oxygen as high as 15 L/m. Patient's healthcare power of attorney was interested in comfort measures, consulted palliative care for goals of care discussion  Principal Problem:   Acute and chronic respiratory failure with hypoxia (HCC) Active Problems:   Chronic respiratory failure with hypoxia (HCC)   Hepatic cirrhosis (HCC)   Thrombocytopenia (HCC)   Pleural effusion associated with hepatic disorder   Syncope and collapse   Palliative care encounter   Anxiety state   Encephalopathy, hepatic (HCC)   Goals of care, counseling/discussion   Assessment/Plan: 1-Acute on Chronic respiratory failure with hypoxia- At some point he required 15 L of oxygen which improved after diuresis, paracentesis and pleurx cath drain.  The night of 7-23 patient became hypoxic back on 15 L oxygen, improved with IV lasix , catheter drain.  Back to 4-5 L oxygen  -chest x ray developing left pleural effusion,  atelectasis or infection.  -drain pleurx cath 7-23 -Continue with IV lasix -IR consulted for paracentesis. Underwent paracentesis 7-23 Mild fever , started on aztreonam 7-22.   2-Diarrhea- resolved, likely from viral gastroenteritis. C. difficile is negative. GI pathogen panel is negative.  3-Liver cirrhosis with ascites- stable, last paracentesis on 10/01/15 drained 2.5 L . Flagyl and Azactam discontinued. Lasix restarted. Ammonia increased. He report 3 BM today, continue with lactulose. Started  Rifampin 7-19..  Does not qualify for residential hospice. Evaluated by palliative. SNF trial. Needs to be follow by palliative and SNF.  Paracentesis 7-23   4-Syncope and collapse- resolved, likely from dehydration and volume depletion from viral gastroenteritis. Troponin negative. Echo showed EF 60-65%, grade 1 diastolic dysfunction. Restarted Lasix 40 mg twice  ,as patient now has stable blood pressure. Continue with Aldactone.   5-Hepatic encephalopathy- ammonia level is 74, on  lactulose 20 g po TID. MELS score 17, 6 % (calculated for labs 7-10 ) mortality in 3 months.  Ammonia at 19.   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 Drained 500 mL on 09/24/2015. Patient would drain once a week every Wednesday -Pleurx cath drain today 7-20. 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  8-Hypokalemia- resolved. Will provide supplement.   9-UTI- UA was positive for nitrite, started   bactrim DS 1 tab po bid, urine cultures are showing insignificant growth. Discontinued Bactrim.   DVT prophylaxis: SCD Code Status: Full code Family Communication: none at bedside.  Disposition Plan: awaiting insurance.    Consultants:  None   Procedures:  Paracentesis time s 2'   Antibiotics:  Anti-infectives    Start  Dose/Rate Route Frequency Ordered Stop   09/23/15 2300  aztreonam  (AZACTAM) 1 g in dextrose 5 % 50 mL IVPB Status: Discontinued    1 g 100 mL/hr over 30 Minutes Intravenous Every 8 hours 09/23/15 2236 09/25/15 0718   09/23/15 2300  metroNIDAZOLE (FLAGYL) IVPB 500 mg Status: Discontinued    500 mg 100 mL/hr over 60 Minutes Intravenous Every 8 hours 09/23/15 2236 09/25/15 0718   09/23/15 1800  levofloxacin (LEVAQUIN) IVPB 750 mg Status: Discontinued    750 mg 100 mL/hr over 90 Minutes Intravenous Every 24 hours 09/23/15 1749 09/23/15 2223           Subjective: Feeling sick on his stomach.  Had BM  Breathing ok.     Objective: Vitals:   10/07/15 1802 10/07/15 2145 10/08/15 0507 10/08/15 0934  BP: (!) 108/57 (!) 104/59 (!) 115/59   Pulse: 85 85 (!) 103   Resp: 18 18 18    Temp: 98.1 F (36.7 C) 98.4 F (36.9 C) 99.1 F (37.3 C)   TempSrc:      SpO2: 96% 95% 91% 93%  Weight:      Height:        Intake/Output Summary (Last 24 hours) at 10/08/15 1434 Last data filed at 10/08/15 0419  Gross per 24 hour  Intake                0 ml  Output             1000 ml  Net            -1000 ml   Filed Weights   09/23/15 1422 09/24/15 0635  Weight: 78 kg (172 lb) 81.2 kg (179 lb 0.2 oz)    Examination:  General exam: Appears calm and comfortable  Respiratory system: Respiratory effort normal. Decrease breath sounds.  Cardiovascular system: S1 & S2 heard, RRR. No JVD, murmurs, rubs, gallops or clicks. Bilateral 1+ pedal edema. Gastrointestinal system: Abdomen is mild distended, soft and nontender. No organomegaly or masses felt. Normal bowel sounds heard. Central nervous system: Alert and oriented. No focal neurological deficits. Extremities: Symmetric 5 x 5 power.  Skin: No rashes, lesions or ulcers Psychiatry: Judgement and insight appear normal. Mood & affect appropriate.    Data Reviewed: I have personally reviewed following labs and imaging studies Basic Metabolic Panel:  Recent Labs Lab  10/03/15 1222 10/04/15 0623 10/06/15 0910 10/07/15 0733 10/08/15 0521  NA 137 136 134* 134* 135  K 3.4* 3.6 3.7 3.5 3.0*  CL 105 105 102 102 101  CO2 28 26 27 28 31   GLUCOSE 106* 90 92 103* 97  BUN 6 7 6 8 8   CREATININE 0.74 0.65 0.69 0.69 0.61  CALCIUM 7.9* 8.2* 8.0* 7.9* 7.7*     Recent Labs Lab 10/04/15 0623 10/07/15 0733  WBC 4.7 5.4  HGB 12.1* 11.7*  HCT 35.5* 34.7*  MCV 107.9* 108.1*  PLT 81* 70*     Recent Labs  08/02/15 2124  BNP 25.5    ProBNP (last 3 results) No results for input(s): PROBNP in the last 8760 hours.  CBG: No results for input(s): GLUCAP in the last 168 hours.  Recent Results (from the past 240 hour(s))  Urine culture     Status: Abnormal   Collection Time: 09/28/15  4:07 PM  Result Value Ref Range Status   Specimen Description URINE, CLEAN CATCH  Final   Special Requests NONE  Final   Culture MULTIPLE SPECIES PRESENT, SUGGEST RECOLLECTION (  A)  Final   Report Status 09/29/2015 FINAL  Final  Urine culture     Status: Abnormal   Collection Time: 09/30/15  8:18 PM  Result Value Ref Range Status   Specimen Description URINE, CLEAN CATCH  Final   Special Requests NONE  Final   Culture MULTIPLE SPECIES PRESENT, SUGGEST RECOLLECTION (A)  Final   Report Status 10/02/2015 FINAL  Final     Studies: US Paracentesis  Result Date: 10/07/2015 INDICATION: Cirrhosis. Abdominal distention secondary to recurrent ascites. Request therapeutic paracentesis. EXAM: ULTRASOUND GUIDED RIGHT LOWER QUADRANT PARACENTESIS MEDICATIONS: None. COMPLICATIONS: None immediate. PROCEDURE: Informed written consent was obtained from the patient after a discussion of the risks, benefits and alternatives to treatment. A timeout was performed prior to the initiation of the procedure. Initial ultrasound scanning demonstrates a small to moderate amount of ascites within the right lower abdominal quadrant. The right lower abdomen was prepped and draped in the usual sterile  fashion. 1% lidocaine with epinephrine was used for local anesthesia. Following this, a Safe-T-Centesis catheter was introduced. An ultrasound image was saved for documentation purposes. The paracentesis was performed. The catheter was removed and a dressing was applied. The patient tolerated the procedure well without immediate post procedural complication. FINDINGS: A total of approximately 1.5 L of clear yellow fluid was removed. IMPRESSION: Successful ultrasound-guided paracentesis yielding 1.5 liters of peritoneal fluid. Read by: Brayton El PA-C Electronically Signed   By: Malachy Moan M.D.   On: 10/07/2015 09:52   Scheduled Meds: . ALPRAZolam  0.5 mg Oral QHS  . aztreonam  1 g Intravenous Q8H  . fluticasone furoate-vilanterol  1 puff Inhalation Daily  . furosemide  40 mg Intravenous Q12H  . lactulose  20 g Oral TID  . pantoprazole  40 mg Oral Daily  . potassium chloride  40 mEq Oral BID  . rifaximin  550 mg Oral BID  . sodium chloride flush  3 mL Intravenous Q12H  . spironolactone  25 mg Oral BID   Continuous Infusions: . sodium chloride 10 mL/hr at 10/08/15 1610     Time spent: 25 min    Jakhi Dishman A  [facility] Pager [phone]. If 7PM-7AM, please contact night-coverage at [email or web address], Office  [phone]  password [email or web address] 10/08/2015, 2:34 PM  LOS: 15 days

## 2015-10-08 NOTE — Progress Notes (Signed)
[employer] has approved stay at [facility]- facility can accept patient tomorrow- CSW paged MD to inform  CSW will continue to follow  Merlyn Lot, [facility] Clinical Social Worker [phone]

## 2015-10-08 NOTE — Progress Notes (Signed)
Physical Therapy Treatment Patient Details Name: Roy Case MRN: [id number] DOB: [birth year] Today's Date: 10/08/2015    History of Present Illness 50 y.o. male with medical history significant for idiopathic hemachromatosis managed with phlebotomies, liver cirrhosis, chronic respiratory failure with supplemental oxygen requirement of 4-5 L/m, and pleural effusions with Pleurx catheter in the right chest presented to the ED with syncope and collapse related to dehydration,fever at home of 100.5.Paracentesis on 7/10, with 3 L, no evidence of SBP, has Pleurx cath, 500 mL drained on 7/10.  Palliative in to help pt with care and end of life decisions.    PT Comments    Patient currently requires supervision/min guard for OOB mobility and is limited by O2 desat with ambulation. 6-8L O2 during session. Current plan remains appropriate.   Follow Up Recommendations  Supervision/Assistance - 24 hour;SNF     Equipment Recommendations  Rolling walker with 5" wheels    Recommendations for Other Services       Precautions / Restrictions Precautions Precautions: Fall;Other (comment) Precaution Comments: O2 dependent, hypoxic with ambulation Restrictions Weight Bearing Restrictions: No    Mobility  Bed Mobility Overal bed mobility: Modified Independent   Rolling: Supervision Sidelying to sit: Supervision       General bed mobility comments: increased time and use of rail  Transfers Overall transfer level: Needs assistance Equipment used: None Transfers: Sit to/from Stand;Stand Pivot Transfers Sit to Stand: Supervision Stand pivot transfers: Min guard       General transfer comment: cues for safe descent onto [facility]; supervision/min guard for safety  Ambulation/Gait Ambulation/Gait assistance: Min guard;Min assist Ambulation Distance (Feet): 40 Feet Assistive device: Rolling walker (2 wheeled) Gait Pattern/deviations: Step-through pattern;Decreased stride length;Trunk  flexed;Narrow base of support Gait velocity: very slow   General Gait Details: multimodal cues for posture, vc for safe use of AD and pursed lip breathing technique; on 6L O2 and SpO2 declined to 67% with HR 100 bpm; pt asymptomatic and returned to sitting; with ~1.5 mins SpO2 up to 85% and 97% end of session   Stairs            Wheelchair Mobility    Modified Rankin (Stroke Patients Only)       Balance     Sitting balance-Leahy Scale: Good       Standing balance-Leahy Scale: Poor                      Cognition Arousal/Alertness: Awake/alert Behavior During Therapy: WFL for tasks assessed/performed Overall Cognitive Status: Within Functional Limits for tasks assessed                      Exercises General Exercises - Lower Extremity Hip Flexion/Marching: AROM;Strengthening;Both;15 reps (resisted flexion/extension)    General Comments        Pertinent Vitals/Pain Pain Assessment: No/denies pain    Home Living                      Prior Function            PT Goals (current goals can now be found in the care plan section) Acute Rehab PT Goals Patient Stated Goal: to get stronger PT Goal Formulation: With patient Time For Goal Achievement: 10/02/15 Potential to Achieve Goals: Good Progress towards PT goals: Progressing toward goals    Frequency  Min 3X/week    PT Plan Current plan remains appropriate    Co-evaluation  End of Session Equipment Utilized During Treatment: Oxygen;Gait belt Activity Tolerance: Patient limited by fatigue;Treatment limited secondary to medical complications (Comment) (O2 desat with OOB mobility) Patient left: with call bell/phone within reach;in chair;with chair alarm set     Time: 4696-2952 PT Time Calculation (min) (ACUTE ONLY): 40 min  Charges:  $Gait Training: 8-22 mins $Therapeutic Activity: 23-37 mins                    G Codes:      Derek Mound, PTA Pager: [phone]   10/08/2015, 3:04 PM

## 2015-10-09 LAB — BASIC METABOLIC PANEL
ANION GAP: 4 — AB (ref 5–15)
BUN: 6 mg/dL (ref 6–20)
CALCIUM: 7.6 mg/dL — AB (ref 8.9–10.3)
CO2: 30 mmol/L (ref 22–32)
CREATININE: 0.66 mg/dL (ref 0.61–1.24)
Chloride: 100 mmol/L — ABNORMAL LOW (ref 101–111)
GFR calc non Af Amer: >60 mL/min (ref >60)
Glucose, Bld: 105 mg/dL — ABNORMAL HIGH (ref 65–99)
Potassium: 3.1 mmol/L — ABNORMAL LOW (ref 3.5–5.1)
SODIUM: 134 mmol/L — AB (ref 135–145)

## 2015-10-09 MED ORDER — SULFAMETHOXAZOLE-TRIMETHOPRIM 800-160 MG PO TABS: 1.0000 | ORAL_TABLET | Freq: Two times a day (BID) | ORAL | 0 refills | Status: AC

## 2015-10-09 MED ORDER — RIFAXIMIN 550 MG PO TABS: 550.0000 mg | ORAL_TABLET | Freq: Two times a day (BID) | ORAL | 0 refills | Status: AC

## 2015-10-09 MED ORDER — OXYCODONE HCL 5 MG PO TABS: 5.0000 mg | ORAL_TABLET | ORAL | 0 refills | Status: AC | PRN

## 2015-10-09 MED ORDER — ALPRAZOLAM 0.5 MG PO TABS: 0.5000 mg | ORAL_TABLET | Freq: Two times a day (BID) | ORAL | 0 refills | Status: AC | PRN

## 2015-10-09 MED ORDER — LACTULOSE 10 GM/15ML PO SOLN: 20.0000 g | Freq: Two times a day (BID) | ORAL | 0 refills | Status: AC

## 2015-10-09 MED ORDER — ONDANSETRON HCL 4 MG/2ML IJ SOLN
4.0000 mg | Freq: Three times a day (TID) | INTRAMUSCULAR | 0 refills | Status: AC
Start: 2015-10-09 — End: ?

## 2015-10-09 NOTE — Progress Notes (Signed)
Patient will discharge to [facility] Anticipated discharge date: 7/25 Family notified: pt HCPOAs [name] and [employer] by Sharin Mons- scheduled for 12pm Report #: [id number]  CSW signing off.  Merlyn Lot, LCSWA Clinical Social Worker [phone]

## 2015-10-09 NOTE — Discharge Summary (Signed)
Physician Discharge Summary  Roy Case ZOX:[id number] DOB: [birth year] DOA: 09/23/2015  PCP: Jeanine Luz, FNP  Admit date: 09/23/2015 Discharge date: 10/09/2015  Admitted From: Home  Disposition:  SNF  Recommendations for Outpatient Follow-up:  1. Follow up with PCP in 1-2 weeks 2. Patient needs to have pleurx cath drain every 3 days.  3. Adjust lactulose for 3 to 4 BM.  4. Please consult palliative care at the facility    Equipment/Devices; uses 4 to 5 L of oxygen.   Discharge Condition: stable.  CODE STATUS:DNR Diet recommendation: Heart Healthy  Brief/Interim Summary:  Brief HPI:  50 y.o. male with medical history significant for idiopathic hemachromatosis managed with phlebotomies, liver cirrhosis, chronic respiratory failure with supplemental oxygen requirement of 4-5 L/m, and pleural effusions with Pleurx catheter in the right chest presented to the ED with syncope and collapse related to dehydration. Patient has been having vomiting and diarrhea since Wednesday. He continued to have diarrhea up until 2 AM this morning. Got up to go to the bathroom and passed out. He also noted to have a low-grade fever off 100.5 yesterday. Denies any hematemesis or hematochezia. Denies any chest pain or shortness of breath. Patient had abnormal UA, was started on Bactrim empirically. Urine culture grew insignificant growth so Bactrim was discontinued. Also patient underwent paracentesis on 10/01/2015. Breathing continues to be stable on nasal cannula requiring oxygen as high as 15 L/m. Patient's healthcare power of attorney was interested in comfort measures, consulted palliative care for goals of care discussion  Patient will try SNF, if he fail might need residential hospice.   Assessment/Plan: 1-Acute on Chronic respiratory failure with hypoxia- At some point he required 15 L of oxygen which improved after diuresis, paracentesis and pleurx cath drain.  The night of 7-23 patient became  hypoxic back on 15 L oxygen, improved with IV lasix , catheter drain.  Back to 4-5 L oxygen  -chest x ray developing left pleural effusion, atelectasis or infection.  -drain pleurx cath 7-23 -change IV lasix to oral.  -IR consulted for paracentesis. Underwent paracentesis 7-23 Mild fever , started on aztreonam 7-22. Discharge on Bactrim for few more days.    2-Diarrhea- resolved, likely from viral gastroenteritis. C. difficile is negative. GI pathogen panel is negative.  3-Liver cirrhosis with ascites- stable, last paracentesis on 10/01/15 drained 2.5 L . Flagyl and Azactam discontinued. Lasix restarted. Ammonia increased. He report 3 BM today, continue with lactulose. Started  Rifampin 7-19..  Does not qualify for residential hospice. Evaluated by palliative. SNF trial. Needs to be follow by palliative and SNF.  Paracentesis 7-23   4-Syncope and collapse- resolved, likely from dehydration and volume depletion from viral gastroenteritis. Troponin negative. Echo showed EF 60-65%, grade 1 diastolic dysfunction. Restarted Lasix 40 mg twice  ,as patient now has stable blood pressure. Continue with Aldactone.   5-Hepatic encephalopathy- ammonia level is 74, on  lactulose 20 g po TID. MELS score 17, 6 % (calculated for labs 7-10 ) mortality in 3 months.  Ammonia at 19.  adjust lactulose as needed.  Started on rifaximin.   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 Drained 500 mL on 09/24/2015. Patient would drain once a week every Wednesday -Pleurx cath drain today 7-20. 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  8-Hypokalemia- resolved. Will provide supplement.   9-UTI- UA was positive for nitrite, started , spike fever 7-22.  Started on aztreonam. He will be discharge on Bactrim.   Discharge Diagnoses:  Principal Problem:   Acute and chronic  respiratory failure with hypoxia (HCC) Active Problems:   Chronic respiratory failure with hypoxia (HCC)   Hepatic cirrhosis (HCC)   Thrombocytopenia (HCC)   Pleural effusion associated with hepatic disorder   Syncope and collapse   Palliative care encounter   Anxiety state   Encephalopathy, hepatic (HCC)   Goals of care, counseling/discussion   Nausea without vomiting   Weakness    Discharge Instructions  Discharge Instructions    Diet - low sodium heart healthy    Complete by:  As directed   Increase activity slowly    Complete by:  As directed       Medication List    STOP taking these medications   codeine 30 MG tablet   ibuprofen 800 MG tablet Commonly known as:  ADVIL,MOTRIN     TAKE these medications   albuterol (2.5 MG/3ML) 0.083% nebulizer solution Commonly known as:  PROVENTIL Take 3 mLs (2.5 mg total) by nebulization every 4 (four) hours as needed for wheezing or shortness of breath.   albuterol 108 (90 Base) MCG/ACT inhaler Commonly known as:  PROVENTIL HFA;VENTOLIN HFA Inhale 2 puffs into the lungs every 6 (six) hours as needed for wheezing or shortness of breath.   ALPRAZolam 0.5 MG tablet Commonly known as:  XANAX Take 1 tablet (0.5 mg total) by mouth 2 (two) times daily as needed for anxiety.   fluticasone furoate-vilanterol 100-25 MCG/INH Aepb Commonly known as:  BREO ELLIPTA Inhale 1 puff into the lungs daily. Reported on 05/16/2015   furosemide 40 MG tablet Commonly known as:  LASIX Take 1 tablet (40 mg total) by mouth 2 (two) times daily.   lactulose 10 GM/15ML solution Commonly known as:  CHRONULAC Take 30 mLs (20 g total) by mouth 2 (two) times daily.   ondansetron 4 MG/2ML Soln injection Commonly known as:  ZOFRAN Inject 2 mLs (4 mg total) into the vein 4 (four) times daily -  before meals and at bedtime.   oxyCODONE 5 MG immediate release tablet Commonly known as:  Oxy IR/ROXICODONE Take 1 tablet (5 mg total) by mouth every 4  (four) hours as needed for moderate pain.   OXYGEN Inhale 4.5-5 L into the lungs continuous.   pantoprazole 40 MG tablet Commonly known as:  PROTONIX Take 40 mg by mouth daily.   polyethylene glycol packet Commonly known as:  MIRALAX / GLYCOLAX Take 17 g by mouth daily as needed for mild constipation.   potassium chloride SA 20 MEQ tablet Commonly known as:  K-DUR,KLOR-CON Take 20 mEq by mouth 2 (two) times daily.   rifaximin 550 MG Tabs tablet Commonly known as:  XIFAXAN Take 1 tablet (550 mg total) by mouth 2 (two) times daily.   spironolactone 25 MG tablet Commonly known as:  ALDACTONE Take 25 mg by mouth 2 (two) times daily.   sulfamethoxazole-trimethoprim 800-160 MG tablet Commonly known as:  BACTRIM DS,SEPTRA DS Take 1 tablet by mouth 2 (two) times daily.      Contact information for after-discharge care    Destination    [facility] .   Specialty:  Skilled Nursing Facility Contact information: [address] [place] [address] [phone]             Allergies  Allergen Reactions  . Azithromycin Other (See Comments)    Blisters on mouth  . Cefdinir Other (  See Comments)    Blisters in mouth   . Penicillins Anaphylaxis and Other (See Comments)    Has patient had a PCN reaction causing immediate rash, facial/tongue/throat swelling, SOB or lightheadedness with hypotension: YES Has patient had a PCN reaction causing severe rash involving mucus membranes or skin necrosis:NO Has patient had a PCN reaction occurring within the last 10 years: NO If all of the above answers are "NO", then may proceed with Cephalosporin use.  . Acetaminophen Other (See Comments)    Reduced liver function   . Levofloxacin Itching    Itching around iv site, tingling of mouth  . Doxycycline Rash    Consultations:  palliative   Procedures/Studies: Dg Chest 2 View  Result Date: 09/25/2015 CLINICAL DATA:  Fever and sob x 2 days EXAM:  CHEST - 2 VIEW COMPARISON:  09/24/2015 FINDINGS: Improved aeration. Left retrocardiac consolidation/ atelectasis, decreased since previous. Right tunneled pleural drain catheter stable. Heart size and mediastinal contours are within normal limits. Blunting of posterior costophrenic angles suggesting small pleural effusions. No pneumothorax. Visualized bones unremarkable. IMPRESSION: 1. Small bilateral pleural effusions, with stable position of right pleural drain catheter. 2. Improved aeration with mild residual left retrocardiac consolidation/atelectasis. Electronically Signed   By: Corlis Leak M.D.   On: 09/25/2015 14:37   US Paracentesis  Result Date: 10/07/2015 INDICATION: Cirrhosis. Abdominal distention secondary to recurrent ascites. Request therapeutic paracentesis. EXAM: ULTRASOUND GUIDED RIGHT LOWER QUADRANT PARACENTESIS MEDICATIONS: None. COMPLICATIONS: None immediate. PROCEDURE: Informed written consent was obtained from the patient after a discussion of the risks, benefits and alternatives to treatment. A timeout was performed prior to the initiation of the procedure. Initial ultrasound scanning demonstrates a small to moderate amount of ascites within the right lower abdominal quadrant. The right lower abdomen was prepped and draped in the usual sterile fashion. 1% lidocaine with epinephrine was used for local anesthesia. Following this, a Safe-T-Centesis catheter was introduced. An ultrasound image was saved for documentation purposes. The paracentesis was performed. The catheter was removed and a dressing was applied. The patient tolerated the procedure well without immediate post procedural complication. FINDINGS: A total of approximately 1.5 L of clear yellow fluid was removed. IMPRESSION: Successful ultrasound-guided paracentesis yielding 1.5 liters of peritoneal fluid. Read by: Brayton El PA-C Electronically Signed   By: Malachy Moan M.D.   On: 10/07/2015 09:52  US  Paracentesis  Result Date: 10/01/2015 INDICATION: Cirrhosis. Abdominal distention secondary to recurrent ascites. Request for therapeutic paracentesis. EXAM: ULTRASOUND GUIDED RIGHT LOWER QUADRANT PARACENTESIS MEDICATIONS: None. COMPLICATIONS: None immediate. PROCEDURE: Informed written consent was obtained from the patient after a discussion of the risks, benefits and alternatives to treatment. A timeout was performed prior to the initiation of the procedure. Initial ultrasound scanning demonstrates a large amount of ascites within the right lower abdominal quadrant. The right lower abdomen was prepped and draped in the usual sterile fashion. 1% lidocaine with epinephrine was used for local anesthesia. Following this, a 19 gauge, 7-cm, Yueh catheter was introduced. An ultrasound image was saved for documentation purposes. The paracentesis was performed. The catheter was removed and a dressing was applied. The patient tolerated the procedure well without immediate post procedural complication. FINDINGS: A total of approximately 2.45 L of clear yellow fluid was removed. IMPRESSION: Successful ultrasound-guided paracentesis yielding 2.45 liters of peritoneal fluid. Read by: Brayton El PA-C Electronically Signed   By: Gilmer Mor D.O.   On: 10/01/2015 16:34   US Paracentesis  Result Date: 09/24/2015 INDICATION: Recurrent ascites EXAM: ULTRASOUND-GUIDED  PARACENTESIS COMPARISON:  Previous paracentesis MEDICATIONS: 10 cc 1% lidocaine COMPLICATIONS: None immediate. TECHNIQUE: Informed written consent was obtained from the patient after a discussion of the risks, benefits and alternatives to treatment. A timeout was performed prior to the initiation of the procedure. Initial ultrasound scanning demonstrates a large amount of ascites within the right lower abdominal quadrant. The right lower abdomen was prepped and draped in the usual sterile fashion. 1% lidocaine with epinephrine was used for local anesthesia.  Under direct ultrasound guidance, a 19 gauge, 7-cm, Yueh catheter was introduced. An ultrasound image was saved for documentation purposed. The paracentesis was performed. The catheter was removed and a dressing was applied. The patient tolerated the procedure well without immediate post procedural complication. FINDINGS: A total of approximately 3 liters of yellow fluid was removed. Samples were sent to the laboratory as requested by the clinical team. IMPRESSION: Successful ultrasound-guided paracentesis yielding 3 liters of peritoneal fluid. Maximum per MD Read by: Robet Leu [facility] Electronically Signed   By: Irish Lack M.D.   On: 09/24/2015 10:17   Dg Chest Port 1 View  Result Date: 10/06/2015 CLINICAL DATA:  Hypoxemia. EXAM: PORTABLE CHEST 1 VIEW COMPARISON:  09/30/2015 FINDINGS: Patient rotated right. Normal heart size for level of inspiration. Possible small left pleural effusion. No pneumothorax. Development of left base airspace disease. IMPRESSION: Development of small left pleural effusion and adjacent atelectasis or infection. Electronically Signed   By: Jeronimo Greaves M.D.   On: 10/06/2015 09:27   Dg Chest Port 1 View  Result Date: 09/30/2015 CLINICAL DATA:  Shortness of breath EXAM: PORTABLE CHEST 1 VIEW COMPARISON:  September 26, 2015 FINDINGS: A right pleural drain remains. No pneumothorax. The heart, hila, mediastinum, lungs, and pleura are otherwise unchanged and unremarkable. IMPRESSION: No active disease. Electronically Signed   By: Gerome Sam III M.D   On: 09/30/2015 21:18   Dg Chest Port 1 View  Result Date: 09/26/2015 CLINICAL DATA:  Right-sided chest wall pain. EXAM: PORTABLE CHEST 1 VIEW COMPARISON:  09/25/2015 FINDINGS: Stable appearance left base atelectasis with probable small left pleural effusion. The right pleural drain is again noted. No focal consolidation in the right lung. No evidence for pneumothorax. Heart size is stable. Telemetry leads overlie the chest.  IMPRESSION: Stable exam.  No new or progressive findings. Electronically Signed   By: Kennith Center M.D.   On: 09/26/2015 18:32   Portable Chest 1 View  Result Date: 09/24/2015 CLINICAL DATA:  Syncope and collapse EXAM: PORTABLE CHEST 1 VIEW COMPARISON:  Abdominal series of September 23, 2015 FINDINGS: The lungs are less well inflated today. Bibasilar densities are more conspicuous today with obscuration of portions of the left hemidiaphragm. The mediastinum is normal in width. The pulmonary vascularity is prominent centrally. The PleurX chest tube tip projects in the medial right cardio phrenic angle. IMPRESSION: 1. No pneumothorax. Small left pleural effusion. Stable right PleurX catheter. 2. Mild cardiomegaly with central pulmonary vascular prominence suggestive of low-grade CHF. Electronically Signed   By: David  [place] M.D.   On: 09/24/2015 07:17   Dg Abd Acute W/chest  Result Date: 09/23/2015 CLINICAL DATA:  Vomiting and diarrhea EXAM: DG ABDOMEN ACUTE W/ 1V CHEST COMPARISON:  Chest radiograph August 22, 2015; CT abdomen April 06, 2015 FINDINGS: PA chest: There is no edema or consolidation. PleurX catheter on the right is again noted. Heart size and pulmonary vascularity are normal. No adenopathy. Supine and upright abdomen: There is moderate stool throughout the colon. There is no  bowel dilatation or air-fluid level suggesting obstruction. No free air evident. There are multiple surgical clips in the right upper abdomen. IMPRESSION: Bowel gas pattern unremarkable. No demonstrable obstruction or free air. No parenchymal lung edema or consolidation. Electronically Signed   By: Bretta Bang III M.D.   On: 09/23/2015 15:51   Dg Abd Portable 1v  Result Date: 09/30/2015 CLINICAL DATA:  Abdominal pain EXAM: PORTABLE ABDOMEN - 1 VIEW COMPARISON:  None. FINDINGS: The bowel gas pattern is normal. No radio-opaque calculi or other significant radiographic abnormality are seen. IMPRESSION: Negative.  Electronically Signed   By: Gerome Sam III M.D   On: 09/30/2015 21:19     Subjective: He is feeling well, denies dyspnea.   Discharge Exam: Vitals:   10/08/15 2105 10/09/15 0607  BP: 112/66 108/62  Pulse: 83 92  Resp:  18  Temp: 98.3 F (36.8 C) 98.1 F (36.7 C)   Vitals:   10/08/15 0934 10/08/15 2105 10/09/15 0607 10/09/15 0852  BP:  112/66 108/62   Pulse:  83 92   Resp:   18   Temp:  98.3 F (36.8 C) 98.1 F (36.7 C)   TempSrc:  Oral Oral   SpO2: 93% 95% 92% 92%  Weight:      Height:        General: Pt is alert, awake, not in acute distress Cardiovascular: RRR, S1/S2 +, no rubs, no gallops Respiratory: CTA bilaterally, no wheezing, no rhonchi Abdominal: Soft, NT, ND, bowel sounds + Extremities: no edema, no cyanosis    The results of significant diagnostics from this hospitalization (including imaging, microbiology, ancillary and laboratory) are listed below for reference.     Microbiology: Recent Results (from the past 240 hour(s))  Urine culture     Status: Abnormal   Collection Time: 09/30/15  8:18 PM  Result Value Ref Range Status   Specimen Description URINE, CLEAN CATCH  Final   Special Requests NONE  Final   Culture MULTIPLE SPECIES PRESENT, SUGGEST RECOLLECTION (A)  Final   Report Status 10/02/2015 FINAL  Final     Labs: BNP (last 3 results)  Recent Labs  08/02/15 2124  BNP 25.5   Basic Metabolic Panel:  Recent Labs Lab 10/03/15 1222 10/04/15 0623 10/06/15 0910 10/07/15 0733 10/08/15 0521  NA 137 136 134* 134* 135  K 3.4* 3.6 3.7 3.5 3.0*  CL 105 105 102 102 101  CO2 28 26 27 28 31   GLUCOSE 106* 90 92 103* 97  BUN 6 7 6 8 8   CREATININE 0.74 0.65 0.69 0.69 0.61  CALCIUM 7.9* 8.2* 8.0* 7.9* 7.7*   Liver Function Tests:  Recent Labs Lab 10/03/15 1222 10/04/15 0623  AST 71* 78*  ALT 30 33  ALKPHOS 59 58  BILITOT 1.6* 2.2*  PROT 5.6* 6.1*  ALBUMIN 2.0* 2.1*   No results for input(s): LIPASE, AMYLASE in the last 168  hours.  Recent Labs Lab 10/04/15 1235  AMMONIA 19   CBC:  Recent Labs Lab 10/04/15 0623 10/07/15 0733  WBC 4.7 5.4  HGB 12.1* 11.7*  HCT 35.5* 34.7*  MCV 107.9* 108.1*  PLT 81* 70*   Cardiac Enzymes: No results for input(s): CKTOTAL, CKMB, CKMBINDEX, TROPONINI in the last 168 hours. BNP: Invalid input(s): POCBNP CBG: No results for input(s): GLUCAP in the last 168 hours. D-Dimer No results for input(s): DDIMER in the last 72 hours. Hgb A1c No results for input(s): HGBA1C in the last 72 hours. Lipid Profile No results for input(s): CHOL,  HDL, LDLCALC, TRIG, CHOLHDL, LDLDIRECT in the last 72 hours. Thyroid function studies No results for input(s): TSH, T4TOTAL, T3FREE, THYROIDAB in the last 72 hours.  Invalid input(s): FREET3 Anemia work up No results for input(s): VITAMINB12, FOLATE, FERRITIN, TIBC, IRON, RETICCTPCT in the last 72 hours. Urinalysis    Component Value Date/Time   COLORURINE YELLOW 09/30/2015 2018   APPEARANCEUR CLEAR 09/30/2015 2018   LABSPEC 1.014 09/30/2015 2018   PHURINE 5.5 09/30/2015 2018   GLUCOSEU NEGATIVE 09/30/2015 2018   HGBUR NEGATIVE 09/30/2015 2018   BILIRUBINUR NEGATIVE 09/30/2015 2018   KETONESUR NEGATIVE 09/30/2015 2018   PROTEINUR NEGATIVE 09/30/2015 2018   NITRITE NEGATIVE 09/30/2015 2018   LEUKOCYTESUR NEGATIVE 09/30/2015 2018   Sepsis Labs Invalid input(s): PROCALCITONIN,  WBC,  LACTICIDVEN Microbiology Recent Results (from the past 240 hour(s))  Urine culture     Status: Abnormal   Collection Time: 09/30/15  8:18 PM  Result Value Ref Range Status   Specimen Description URINE, CLEAN CATCH  Final   Special Requests NONE  Final   Culture MULTIPLE SPECIES PRESENT, SUGGEST RECOLLECTION (A)  Final   Report Status 10/02/2015 FINAL  Final     Time coordinating discharge: Over 30 minutes  SIGNED:   Alba Cory, MD  [facility] 10/09/2015, 10:05 AM Pager [phone]  If 7PM-7AM, please contact  night-coverage [email or web address] Password [email or web address]

## 2015-10-09 NOTE — Care Management Note (Addendum)
Case Management Note  Patient Details  Name: KAYLE SHIVER MRN: [id number] Date of Birth: 07-16-1965  Subjective/Objective:                    Action/Plan:   SNF approved by insurance. Patient will DC to SNF today as facilitated by CSW. Bedside RN instructed to DC patient with box of pleurex drains to SNF. CSW aware patient DCing to SNF with Pleurex drains.   Expected Discharge Date:                  Expected Discharge Plan:  Skilled Nursing Facility (to be determined)  In-House Referral:  Clinical Social Work  Discharge planning Services  CM Consult  Post Acute Care Choice:    Choice offered to:     DME Arranged:    DME Agency:     HH Arranged:    HH Agency:     Status of Service:  Completed, signed off  If discussed at [employer], dates discussed:    Additional Comments:  Lawerance Sabal, RN 10/09/2015, 11:19 AM

## 2015-10-09 NOTE — Clinical Social Work Placement (Signed)
   CLINICAL SOCIAL WORK PLACEMENT  NOTE  Date:  10/09/2015  Patient Details  Name: Roy Case MRN: [id number] Date of Birth: Aug 12, 1965  Clinical Social Work is seeking post-discharge placement for this patient at the Skilled  Nursing Facility level of care (*CSW will initial, date and re-position this form in  chart as items are completed):  Yes   Patient/family provided with [facility] Clinical Social Work Department's list of facilities offering this level of care within the geographic area requested by the patient (or if unable, by the patient's family).  Yes   Patient/family informed of their freedom to choose among providers that offer the needed level of care, that participate in Medicare, Medicaid or managed care program needed by the patient, have an available bed and are willing to accept the patient.  Yes   Patient/family informed of [facility]'s ownership interest in [facility] and [facility], as well as of the fact that they are under no obligation to receive care at these facilities.  PASRR submitted to EDS on 09/27/15     PASRR number received on 09/27/15     Existing PASRR number confirmed on       FL2 transmitted to all facilities in geographic area requested by pt/family on 09/27/15     FL2 transmitted to all facilities within larger geographic area on       Patient informed that his/her managed care company has contracts with or will negotiate with certain facilities, including the following:        Yes   Patient/family informed of bed offers received.  Patient chooses bed at [facility]     Physician recommends and patient chooses bed at      Patient to be transferred to [facility] on 10/09/15.  Patient to be transferred to facility by ptar     Patient family notified on 10/09/15 of transfer.  Name of family member notified:  [name] and [name]     PHYSICIAN Please sign FL2, Please sign DNR      Additional Comment:    _______________________________________________ Izora Ribas, LCSW 10/09/2015, 11:21 AM

## 2015-10-09 NOTE — Progress Notes (Signed)
Pt being d/c'd to [facility]. Pt is in stable condition. Report called to receiving nurse, Kennyth Arnold. All questions answered. Pt being transported via stretcher by [facility]. Drainage catheter sets will be sent with pt.

## 2015-10-10 NOTE — Progress Notes (Signed)
Met with Roy Case at bedside.   Assessed his mental status to see if the decreased dose of lactulose was still effective.  He seems alert and orientated, not encephalopathic.   We discussed discharge to [facility], the necessity for regular pleurx drainage, regular paracentesis and regularly scheduled lactulose.  We also discussed residential hospice. Roy Case understands that he is near end of life (months).    I clarified for Roy Case that residential hospice is for comfort and dignity at end of life - frankly, people go their to die.     Roy Case understood what residential hospice does.  While he is not excited about living in a SNF he was clear that his goal is to live as long as he can as well as he can.  He is still enjoying life and would prefer to put off residential hospice as long as possible.  Roy Case, [name] Pager: [phone]

## 2015-10-15 ENCOUNTER — Telehealth: Payer: Self-pay

## 2015-10-15 NOTE — Telephone Encounter (Signed)
Pt was due for CT scan. Letter mailed to pt to call and schedule CT. Received call from family member that pt is in the hospital and he will not be coming home from there. Pt has palliative care now.

## 2015-10-18 ENCOUNTER — Encounter: Payer: Self-pay | Admitting: Family

## 2015-10-18 ENCOUNTER — Other Ambulatory Visit (HOSPITAL_BASED_OUTPATIENT_CLINIC_OR_DEPARTMENT_OTHER): Payer: BLUE CROSS/BLUE SHIELD

## 2015-10-18 ENCOUNTER — Ambulatory Visit (HOSPITAL_BASED_OUTPATIENT_CLINIC_OR_DEPARTMENT_OTHER): Payer: BLUE CROSS/BLUE SHIELD | Admitting: Family

## 2015-10-18 DIAGNOSIS — K769 Liver disease, unspecified: Secondary | ICD-10-CM | POA: Diagnosis not present

## 2015-10-18 DIAGNOSIS — D6959 Other secondary thrombocytopenia: Secondary | ICD-10-CM

## 2015-10-18 DIAGNOSIS — J9611 Chronic respiratory failure with hypoxia: Secondary | ICD-10-CM

## 2015-10-18 DIAGNOSIS — I959 Hypotension, unspecified: Secondary | ICD-10-CM

## 2015-10-18 DIAGNOSIS — R188 Other ascites: Secondary | ICD-10-CM

## 2015-10-18 DIAGNOSIS — I509 Heart failure, unspecified: Secondary | ICD-10-CM

## 2015-10-18 DIAGNOSIS — K7460 Unspecified cirrhosis of liver: Secondary | ICD-10-CM

## 2015-10-18 LAB — FERRITIN: FERRITIN: 441 ng/mL — AB (ref 22–316)

## 2015-10-18 LAB — CBC WITH DIFFERENTIAL (CANCER CENTER ONLY)
BASO#: 0 10*3/uL (ref 0.0–0.2)
BASO%: 0.4 % (ref 0.0–2.0)
EOS%: 1.9 % (ref 0.0–7.0)
Eosinophils Absolute: 0.1 10*3/uL (ref 0.0–0.5)
HEMATOCRIT: 39.2 % (ref 38.7–49.9)
HEMOGLOBIN: 14 g/dL (ref 13.0–17.1)
LYMPH#: 0.4 10*3/uL — AB (ref 0.9–3.3)
LYMPH%: 8.4 % — ABNORMAL LOW (ref 14.0–48.0)
MCH: 38.8 pg — ABNORMAL HIGH (ref 28.0–33.4)
MCHC: 35.7 g/dL (ref 32.0–35.9)
MCV: 109 fL — ABNORMAL HIGH (ref 82–98)
MONO#: 0.7 10*3/uL (ref 0.1–0.9)
MONO%: 13.6 % — AB (ref 0.0–13.0)
NEUT%: 75.7 % (ref 40.0–80.0)
NEUTROS ABS: 3.9 10*3/uL (ref 1.5–6.5)
Platelets: 81 10*3/uL — ABNORMAL LOW (ref 145–400)
RBC: 3.61 10*6/uL — ABNORMAL LOW (ref 4.20–5.70)
RDW: 13.9 % (ref 11.1–15.7)
WBC: 5.1 10*3/uL (ref 4.0–10.0)

## 2015-10-18 LAB — COMPREHENSIVE METABOLIC PANEL
ALT: 33 U/L (ref 0–55)
ANION GAP: 9 meq/L (ref 3–11)
AST: 64 U/L — ABNORMAL HIGH (ref 5–34)
Albumin: 2.2 g/dL — ABNORMAL LOW (ref 3.5–5.0)
Alkaline Phosphatase: 70 U/L (ref 40–150)
BUN: 7.3 mg/dL (ref 7.0–26.0)
CALCIUM: 8.1 mg/dL — AB (ref 8.4–10.4)
CHLORIDE: 104 meq/L (ref 98–109)
CO2: 22 meq/L (ref 22–29)
Creatinine: 0.8 mg/dL (ref 0.7–1.3)
Glucose: 92 mg/dl (ref 70–140)
POTASSIUM: 3.4 meq/L — AB (ref 3.5–5.1)
Sodium: 135 mEq/L — ABNORMAL LOW (ref 136–145)
Total Bilirubin: 2.13 mg/dL — ABNORMAL HIGH (ref 0.20–1.20)
Total Protein: 7 g/dL (ref 6.4–8.3)

## 2015-10-18 LAB — IRON AND TIBC
%SAT: 36 % (ref 20–55)
IRON: 51 ug/dL (ref 42–163)
TIBC: 140 ug/dL — AB (ref 202–409)
UIBC: 89 ug/dL — AB (ref 117–376)

## 2015-10-18 LAB — LACTATE DEHYDROGENASE: LDH: 237 U/L (ref 125–245)

## 2015-10-18 NOTE — Progress Notes (Signed)
Hematology and Oncology Follow Up Visit  Roy Case [id number] 01-Sep-1965 50 y.o. 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 Roy Case is here today for a follow-up. He has been in and out of the hospital frequently over the last few months with cirrhosis and fluid overload. He has a right pleurx catheter (drained every 3 days) and has also had multiple paracentesis for ascites. He is on Lasix BID. He is currently living in a skilled nursing facility.  He has had a few episodes of nausea without vomiting.  He has had several syncopal episodes. He is hypotensive at this time. As such, we will hold off on phlebotomizing him at this time.  He is SOB with any exertion and is currently on 5-15 L [place] supplemental O2 24 hours/day.  He has no appetite but states that he is trying to drink fluids. His weight is down 21 lbs since last month.  No fever, chills, cough, rash, chest pain, palpitations, or changes in bowel or bladder habits. He is still having frequent BMs.  No episodes of bleeding or bruising. No lymphadenopathy found on exam.  He has met with palliative care and understands that he is nearing end of life. His desire at this time is be comfortable and enjoy the time he has left.   Medications:    Medication List       Accurate as of 10/18/15  9:41 AM. Always use your most recent med list.          albuterol (2.5 MG/3ML) 0.083% nebulizer solution Commonly known as:  PROVENTIL Take 3 mLs (2.5 mg total) by nebulization every 4 (four) hours as needed for wheezing or shortness of breath.   albuterol 108 (90 Base) MCG/ACT inhaler Commonly known as:  PROVENTIL HFA;VENTOLIN HFA Inhale 2 puffs into the lungs every 6 (six) hours as needed for wheezing or shortness of breath.   ALPRAZolam 0.5 MG tablet Commonly known as:   XANAX Take 1 tablet (0.5 mg total) by mouth 2 (two) times daily as needed for anxiety.   fluticasone furoate-vilanterol 100-25 MCG/INH Aepb Commonly known as:  BREO ELLIPTA Inhale 1 puff into the lungs daily. Reported on 05/16/2015   furosemide 40 MG tablet Commonly known as:  LASIX Take 1 tablet (40 mg total) by mouth 2 (two) times daily.   lactulose 10 GM/15ML solution Commonly known as:  CHRONULAC Take 30 mLs (20 g total) by mouth 2 (two) times daily.   ondansetron 4 MG/2ML Soln injection Commonly known as:  ZOFRAN Inject 2 mLs (4 mg total) into the vein 4 (four) times daily -  before meals and at bedtime.   oxyCODONE 5 MG immediate release tablet Commonly known as:  Oxy IR/ROXICODONE Take 1 tablet (5 mg total) by mouth every 4 (four) hours as needed for moderate pain.   OXYGEN Inhale 4.5-5 L into the lungs continuous.   pantoprazole 40 MG tablet Commonly known as:  PROTONIX Take 40 mg by mouth daily.   polyethylene glycol packet Commonly known as:  MIRALAX / GLYCOLAX Take 17 g by mouth daily as needed for mild constipation.   potassium chloride SA 20 MEQ tablet Commonly known as:  K-DUR,KLOR-CON Take 20 mEq by mouth 2 (two) times daily.   rifaximin 550 MG Tabs tablet Commonly known as:  XIFAXAN Take 1 tablet (550 mg total) by mouth  2 (two) times daily.   spironolactone 25 MG tablet Commonly known as:  ALDACTONE Take 25 mg by mouth 2 (two) times daily.   sulfamethoxazole-trimethoprim 800-160 MG tablet Commonly known as:  BACTRIM DS,SEPTRA DS Take 1 tablet by mouth 2 (two) times daily.       Allergies:  Allergies  Allergen Reactions  . Azithromycin Other (See Comments)    Blisters on mouth  . Cefdinir Other (See Comments)    Blisters in mouth   . Penicillins Anaphylaxis and Other (See Comments)    Has patient had a PCN reaction causing immediate rash, facial/tongue/throat swelling, SOB or lightheadedness with hypotension: YES Has patient had a PCN  reaction causing severe rash involving mucus membranes or skin necrosis:NO Has patient had a PCN reaction occurring within the last 10 years: NO If all of the above answers are "NO", then may proceed with Cephalosporin use.  . Acetaminophen Other (See Comments)    Reduced liver function   . Levofloxacin Itching    Itching around iv site, tingling of mouth  . Doxycycline Rash    Past Medical History, Surgical history, Social history, and Family History were reviewed and updated.  Review of Systems: All other 10 point review of systems is negative.   Physical Exam:  height is '5\' 9"'  (1.753 m) and weight is 158 lb (71.7 kg). His oral temperature is 98.1 F (36.7 C). His blood pressure is 101/87 and his pulse is 83. His respiration is 20 and oxygen saturation is 94%.   Wt Readings from Last 3 Encounters:  10/18/15 158 lb (71.7 kg)  09/24/15 179 lb 0.2 oz (81.2 kg)  08/27/15 180 lb (81.6 kg)    Ocular: Sclerae unicteric, pupils equal, round and reactive to light Ear-nose-throat: Oropharynx clear, dentition fair Lymphatic: No cervical supraclavicular or axillary adenopathy Lungs no rales or rhonchi, good excursion bilaterally Heart regular rate and rhythm, no murmur appreciated Abd soft, nontender, positive bowel sounds, no liver or spleen tip palpated at this time, no fluid wave noted on exam MSK no focal spinal tenderness, no joint edema Neuro: non-focal, well-oriented, appropriate affect Breasts: Deferred  Lab Results  Component Value Date   WBC 5.1 10/18/2015   HGB 14.0 10/18/2015   HCT 39.2 10/18/2015   MCV 109 (H) 10/18/2015   PLT 81 (L) 10/18/2015   Lab Results  Component Value Date   FERRITIN 469 (H) 06/27/2015   IRON 167 (H) 06/27/2015   TIBC 171 (L) 06/27/2015   UIBC 5 (L) 06/27/2015   IRONPCTSAT 97 (H) 06/27/2015   Lab Results  Component Value Date   RBC 3.61 (L) 10/18/2015   No results found for: KPAFRELGTCHN, LAMBDASER, KAPLAMBRATIO No results found for:  IGGSERUM, IGA, IGMSERUM No results found for: TOTALPROTELP, ALBUMINELP, A1GS, A2GS, Arnaldo Natal, GAMS, MSPIKE, SPEI   Chemistry      Component Value Date/Time   NA 134 (L) 10/09/2015 1116   NA 143 06/27/2015 1039   K 3.1 (L) 10/09/2015 1116   K 4.2 06/27/2015 1039   CL 100 (L) 10/09/2015 1116   CL 108 05/18/2015 1024   CO2 30 10/09/2015 1116   CO2 21 (L) 06/27/2015 1039   BUN 6 10/09/2015 1116   BUN 8.0 06/27/2015 1039   CREATININE 0.66 10/09/2015 1116   CREATININE 0.7 06/27/2015 1039      Component Value Date/Time   CALCIUM 7.6 (L) 10/09/2015 1116   CALCIUM 8.6 06/27/2015 1039   ALKPHOS 58 10/04/2015 0623   ALKPHOS 76 06/27/2015  1039   AST 78 (H) 10/04/2015 0623   AST 60 (H) 06/27/2015 1039   ALT 33 10/04/2015 0623   ALT 30 06/27/2015 1039   BILITOT 2.2 (H) 10/04/2015 0623   BILITOT 3.35 (H) 06/27/2015 1039     Impression and Plan: Mr. Hidalgo is a very pleasant 50 yo white male with history of hemochromatosis with iron overload and history of phlebotomy. He has cirrhosis and has been in and out of the hospital multiple times over the last few months with complication from this. He has a pleurx catherter in the right chest which is being drained every 3 days. He has also had several paracentesis procedures to help with ascites. He is on 5-15 L [place] sup O2 24 hours a day (15 L at night). He is currently living in a skilled nursing facility and has spoken with palliative care. He is now focused on being comfortable and enjoying the time he has left with his family.  He is hypotensive and has had several syncopal episodes recently. We will not phlebotomize him.  We will plan to see him back in 2 months for repeat labs and follow-up.  Both he and his family know to contact our office with any questions or concerns. We can certainly see him sooner.   Eliezer Bottom, NP 8/3/20179:41 AM

## 2015-10-18 NOTE — Progress Notes (Deleted)
No treatment needed today due to low BP per M.D order

## 2015-12-16 DIAGNOSIS — R079 Chest pain, unspecified: Secondary | ICD-10-CM | POA: Diagnosis not present

## 2015-12-16 DIAGNOSIS — K7460 Unspecified cirrhosis of liver: Secondary | ICD-10-CM | POA: Diagnosis not present

## 2015-12-16 DIAGNOSIS — R0902 Hypoxemia: Secondary | ICD-10-CM | POA: Diagnosis not present

## 2015-12-17 DIAGNOSIS — K7460 Unspecified cirrhosis of liver: Secondary | ICD-10-CM | POA: Diagnosis not present

## 2015-12-17 DIAGNOSIS — R079 Chest pain, unspecified: Secondary | ICD-10-CM | POA: Diagnosis not present

## 2015-12-17 DIAGNOSIS — R0902 Hypoxemia: Secondary | ICD-10-CM | POA: Diagnosis not present

## 2015-12-20 ENCOUNTER — Ambulatory Visit (HOSPITAL_BASED_OUTPATIENT_CLINIC_OR_DEPARTMENT_OTHER): Payer: BLUE CROSS/BLUE SHIELD | Admitting: Family

## 2015-12-20 ENCOUNTER — Other Ambulatory Visit (HOSPITAL_BASED_OUTPATIENT_CLINIC_OR_DEPARTMENT_OTHER): Payer: BLUE CROSS/BLUE SHIELD

## 2015-12-20 ENCOUNTER — Encounter: Payer: Self-pay | Admitting: Family

## 2015-12-20 DIAGNOSIS — D6959 Other secondary thrombocytopenia: Secondary | ICD-10-CM

## 2015-12-20 DIAGNOSIS — K7460 Unspecified cirrhosis of liver: Secondary | ICD-10-CM

## 2015-12-20 DIAGNOSIS — K769 Liver disease, unspecified: Secondary | ICD-10-CM | POA: Diagnosis not present

## 2015-12-20 DIAGNOSIS — J9611 Chronic respiratory failure with hypoxia: Secondary | ICD-10-CM

## 2015-12-20 DIAGNOSIS — R188 Other ascites: Secondary | ICD-10-CM

## 2015-12-20 LAB — CBC WITH DIFFERENTIAL (CANCER CENTER ONLY)
BASO#: 0 10*3/uL (ref 0.0–0.2)
BASO%: 0.3 % (ref 0.0–2.0)
EOS ABS: 0.2 10*3/uL (ref 0.0–0.5)
EOS%: 2.2 % (ref 0.0–7.0)
HCT: 37.4 % — ABNORMAL LOW (ref 38.7–49.9)
HGB: 13.5 g/dL (ref 13.0–17.1)
LYMPH#: 0.6 10*3/uL — ABNORMAL LOW (ref 0.9–3.3)
LYMPH%: 7.3 % — AB (ref 14.0–48.0)
MCH: 37.8 pg — AB (ref 28.0–33.4)
MCHC: 36.1 g/dL — ABNORMAL HIGH (ref 32.0–35.9)
MCV: 105 fL — ABNORMAL HIGH (ref 82–98)
MONO#: 1.1 10*3/uL — ABNORMAL HIGH (ref 0.1–0.9)
MONO%: 14.1 % — AB (ref 0.0–13.0)
NEUT#: 5.8 10*3/uL (ref 1.5–6.5)
NEUT%: 76.1 % (ref 40.0–80.0)
PLATELETS: 89 10*3/uL — AB (ref 145–400)
RBC: 3.57 10*6/uL — AB (ref 4.20–5.70)
RDW: 15.2 % (ref 11.1–15.7)
WBC: 7.6 10*3/uL (ref 4.0–10.0)

## 2015-12-20 LAB — FERRITIN: Ferritin: 933 ng/ml — ABNORMAL HIGH (ref 22–316)

## 2015-12-20 LAB — CMP (CANCER CENTER ONLY)
ALK PHOS: 68 U/L (ref 26–84)
ALT: 47 U/L (ref 10–47)
AST: 84 U/L — AB (ref 11–38)
Albumin: 2.4 g/dL — ABNORMAL LOW (ref 3.3–5.5)
BILIRUBIN TOTAL: 2.4 mg/dL — AB (ref 0.20–1.60)
BUN: 14 mg/dL (ref 7–22)
CO2: 27 mEq/L (ref 18–33)
Calcium: 8.1 mg/dL (ref 8.0–10.3)
Chloride: 97 mEq/L — ABNORMAL LOW (ref 98–108)
Creat: 0.7 mg/dl (ref 0.6–1.2)
GLUCOSE: 106 mg/dL (ref 73–118)
Potassium: 4.2 mEq/L (ref 3.3–4.7)
SODIUM: 125 meq/L — AB (ref 128–145)
Total Protein: 7.3 g/dL (ref 6.4–8.1)

## 2015-12-20 LAB — IRON AND TIBC
%SAT: 44 % (ref 20–55)
Iron: 47 ug/dL (ref 42–163)
TIBC: 108 ug/dL — ABNORMAL LOW (ref 202–409)
UIBC: 60 ug/dL — AB (ref 117–376)

## 2015-12-20 LAB — LACTATE DEHYDROGENASE: LDH: 235 U/L (ref 125–245)

## 2015-12-20 NOTE — Progress Notes (Signed)
Hematology and Oncology Follow Up Visit  Roy Case [id number] [birth year] 50 y.o. 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 Roy Case is here today with his caregiver for a follow-up. He continues to deteriorate. His weight is down another 21 lbs since his last visit. His appetite is still poor but states that he is trying to drink fluids.  His O2 tank was getting low and he was complaining of dizziness upon arrival. We now have him on one of our tanks and he is starting to feel a little better.  He is SOB with any exertion and is currently on 5-6 L  supplemental O2 24 hours/day.  He still has the pleurx catheter in place and has this drained PRN. He states that this was drained on Monday and 50 cc of blood was removed.  No fever, chills, n/v, cough, rash, chest pain, palpitations, abdominal pain or changes in bowel or bladder habits. Platelet count is now 89. No episodes of bleeding or bruising. No lymphadenopathy found on exam.  He states that he has a great family and support system.   Medications:    Medication List       Accurate as of 12/20/15 12:12 PM. Always use your most recent med list.          albuterol (2.5 MG/3ML) 0.083% nebulizer solution Commonly known as:  PROVENTIL Take 3 mLs (2.5 mg total) by nebulization every 4 (four) hours as needed for wheezing or shortness of breath.   albuterol 108 (90 Base) MCG/ACT inhaler Commonly known as:  PROVENTIL HFA;VENTOLIN HFA Inhale 2 puffs into the lungs every 6 (six) hours as needed for wheezing or shortness of breath.   ALPRAZolam 0.5 MG tablet Commonly known as:  XANAX Take 1 tablet (0.5 mg total) by mouth 2 (two) times daily as needed for anxiety.   fluticasone furoate-vilanterol 100-25 MCG/INH Aepb Commonly  known as:  BREO ELLIPTA Inhale 1 puff into the lungs daily. Reported on 05/16/2015   furosemide 40 MG tablet Commonly known as:  LASIX Take 1 tablet (40 mg total) by mouth 2 (two) times daily.   lactulose 10 GM/15ML solution Commonly known as:  CHRONULAC Take 30 mLs (20 g total) by mouth 2 (two) times daily.   NARCAN 4 MG/0.1ML Liqd Generic drug:  naloxone HCl Place into the nose.   nitroGLYCERIN 0.4 MG SL tablet Commonly known as:  NITROSTAT Place 0.4 mg under the tongue every 5 (five) minutes as needed for chest pain.   ondansetron 4 MG/2ML Soln injection Commonly known as:  ZOFRAN Inject 2 mLs (4 mg total) into the vein 4 (four) times daily -  before meals and at bedtime.   oxyCODONE 5 MG immediate release tablet Commonly known as:  Oxy IR/ROXICODONE Take 1 tablet (5 mg total) by mouth every 4 (four) hours as needed for moderate pain.   OXYGEN Inhale 4.5-5 L into the lungs continuous.   pantoprazole 40 MG tablet Commonly known as:  PROTONIX Take 40 mg by mouth daily.   polyethylene glycol packet Commonly known as:  MIRALAX / GLYCOLAX Take 17 g by mouth daily as needed for mild constipation.   potassium chloride SA 20 MEQ tablet Commonly known as:  K-DUR,KLOR-CON Take 20 mEq by mouth 2 (two) times daily.   promethazine 12.5 MG tablet  Commonly known as:  PHENERGAN Take 12.5 mg by mouth every 6 (six) hours as needed for nausea or vomiting.   rifaximin 550 MG Tabs tablet Commonly known as:  XIFAXAN Take 1 tablet (550 mg total) by mouth 2 (two) times daily.   spironolactone 25 MG tablet Commonly known as:  ALDACTONE Take 25 mg by mouth 2 (two) times daily.   sulfamethoxazole-trimethoprim 800-160 MG tablet Commonly known as:  BACTRIM DS,SEPTRA DS Take 1 tablet by mouth 2 (two) times daily.   UNABLE TO FIND Take 5 mLs by mouth every 2 (two) hours as needed. Morphine sulfate concentrate solution       Allergies:  Allergies  Allergen Reactions  .  Azithromycin Other (See Comments)    Blisters on mouth  . Cefdinir Other (See Comments)    Blisters in mouth   . Penicillins Anaphylaxis and Other (See Comments)    Has patient had a PCN reaction causing immediate rash, facial/tongue/throat swelling, SOB or lightheadedness with hypotension: YES Has patient had a PCN reaction causing severe rash involving mucus membranes or skin necrosis:NO Has patient had a PCN reaction occurring within the last 10 years: NO If all of the above answers are "NO", then may proceed with Cephalosporin use.  . Acetaminophen Other (See Comments)    Reduced liver function   . Levofloxacin Itching    Itching around iv site, tingling of mouth  . Doxycycline Rash    Past Medical History, Surgical history, Social history, and Family History were reviewed and updated.  Review of Systems: All other 10 point review of systems is negative.   Physical Exam:  height is 5\' 9"  (1.753 m) and weight is 137 lb (62.1 kg). His oral temperature is 99 F (37.2 C). His blood pressure is 122/70 and his pulse is 104 (abnormal). His respiration is 18.   Wt Readings from Last 3 Encounters:  12/20/15 137 lb (62.1 kg)  10/18/15 158 lb (71.7 kg)  09/24/15 179 lb 0.2 oz (81.2 kg)    Ocular: Sclerae unicteric, pupils equal, round and reactive to light Ear-nose-throat: Oropharynx clear, dentition fair Lymphatic: No cervical supraclavicular or axillary adenopathy Lungs no rales or rhonchi, good excursion bilaterally Heart regular rate and rhythm, no murmur appreciated Abd soft, nontender, positive bowel sounds, no liver or spleen tip palpated at this time, no fluid wave noted on exam MSK no focal spinal tenderness, no joint edema Neuro: non-focal, well-oriented, appropriate affect Breasts: Deferred  Lab Results  Component Value Date   WBC 7.6 12/20/2015   HGB 13.5 12/20/2015   HCT 37.4 (L) 12/20/2015   MCV 105 (H) 12/20/2015   PLT 89 (L) 12/20/2015   Lab Results    Component Value Date   FERRITIN 441 (H) 10/18/2015   IRON 51 10/18/2015   TIBC 140 (L) 10/18/2015   UIBC 89 (L) 10/18/2015   IRONPCTSAT 36 10/18/2015   Lab Results  Component Value Date   RBC 3.57 (L) 12/20/2015   No results found for: KPAFRELGTCHN, LAMBDASER, KAPLAMBRATIO No results found for: IGGSERUM, IGA, IGMSERUM No results found for: Dorene Ar, A1GS, A2GS, BETS, BETA2SER, GAMS, MSPIKE, SPEI   Chemistry      Component Value Date/Time   NA 125 (L) 12/20/2015 1122   NA 135 (L) 10/18/2015 0845   K 4.2 12/20/2015 1122   K 3.4 (L) 10/18/2015 0845   CL 97 (L) 12/20/2015 1122   CO2 27 12/20/2015 1122   CO2 22 10/18/2015 0845   BUN 14 12/20/2015 1122  BUN 7.3 10/18/2015 0845   CREATININE 0.7 12/20/2015 1122   CREATININE 0.8 10/18/2015 0845      Component Value Date/Time   CALCIUM 8.1 12/20/2015 1122   CALCIUM 8.1 (L) 10/18/2015 0845   ALKPHOS 68 12/20/2015 1122   ALKPHOS 70 10/18/2015 0845   AST 84 (H) 12/20/2015 1122   AST 64 (H) 10/18/2015 0845   ALT 47 12/20/2015 1122   ALT 33 10/18/2015 0845   BILITOT 2.40 (H) 12/20/2015 1122   BILITOT 2.13 (H) 10/18/2015 0845     Impression and Plan: [name] is a very pleasant 50 yo white male with history of hemochromatosis with iron overload and history of phlebotomy. At this time the goal for care is palliative. He continues to deteriorate and has lost another 21 lbs. He denies pain and is comfortable at this time on 6L O2 [place].  We no longer phlebotomize him as the risk associated with loss of fluid volume outweighs the benefit.  We will now follow-up with him only as needed. His nursing facility and family know to contact our office with any questions or concerns. We can certainly see him again for any hematologic needs.   Verdie Mos[place],Ethelwyn Gilbertson M, NP 10/5/201712:12 PM

## 2016-04-17 DEATH — deceased

## 2016-10-23 IMAGING — CT CT ANGIO CHEST
1 of 12 series · 10 of 38 positions shown · IV contrast (isovue)
Comparison: 08/07/2015

CLINICAL DATA: Hemoptysis

EXAM:
CT ANGIOGRAPHY CHEST WITH CONTRAST
TECHNIQUE: Multidetector CT imaging of the chest was performed using the
standard protocol during bolus administration of intravenous
contrast. Multiplanar CT image reconstructions and MIPs were
obtained to evaluate the vascular anatomy.
CONTRAST:  165 cc Isovue 370 intravenous (given in divided doses due
to suboptimal bolus timing on initial exam).

[Series 15: thins for pacs · axial · 0.80mm/px · z∈[-280,-56]mm · 10 of 274 slices shown]
[im 25/274  lung]
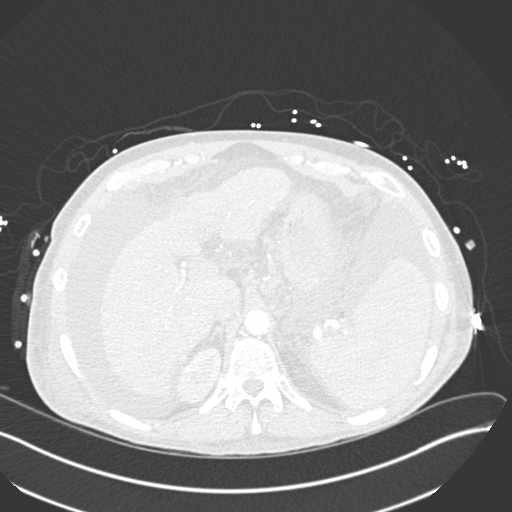
[im 50/274  mediastinal]
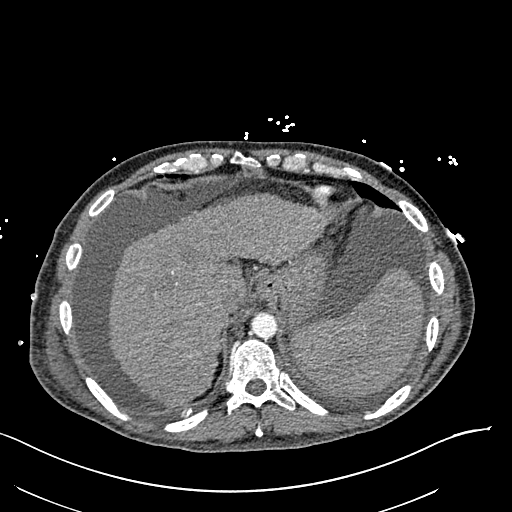
[im 75/274  lung]
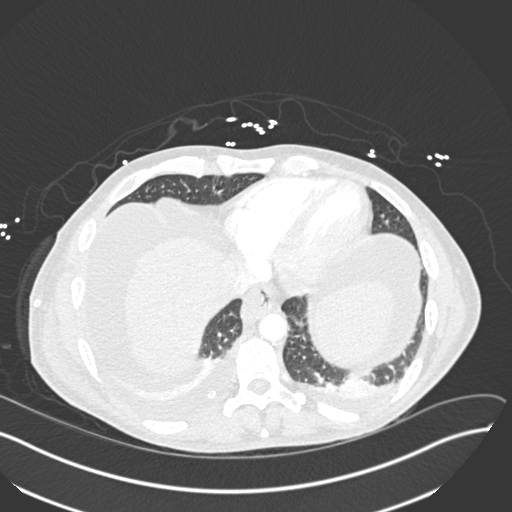
[im 100/274  mediastinal]
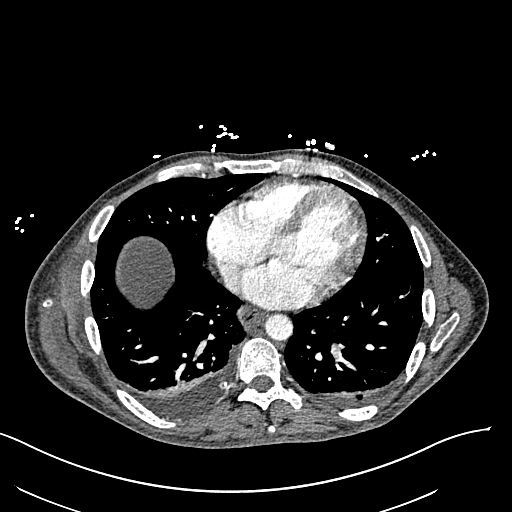
[im 125/274  lung]
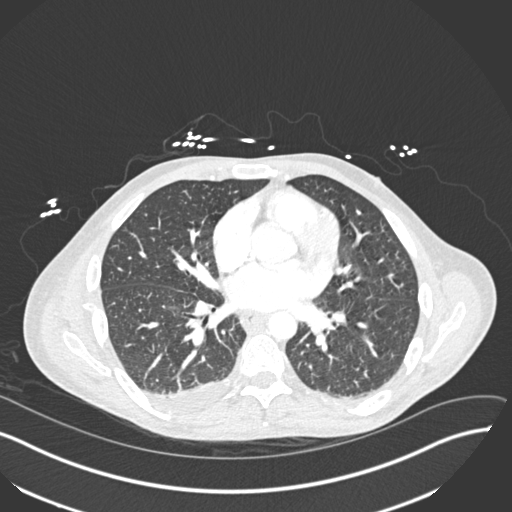
[im 149/274  mediastinal]
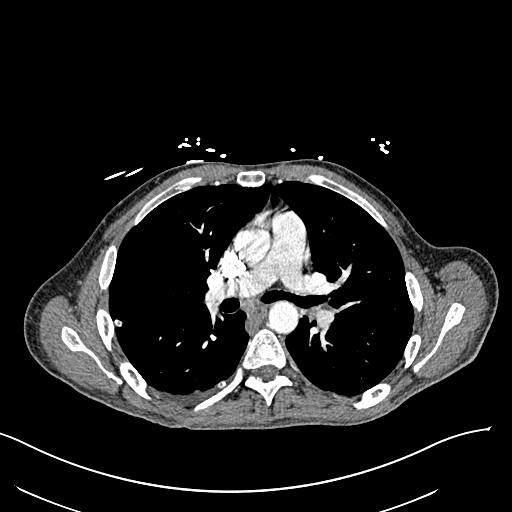
[im 174/274  lung]
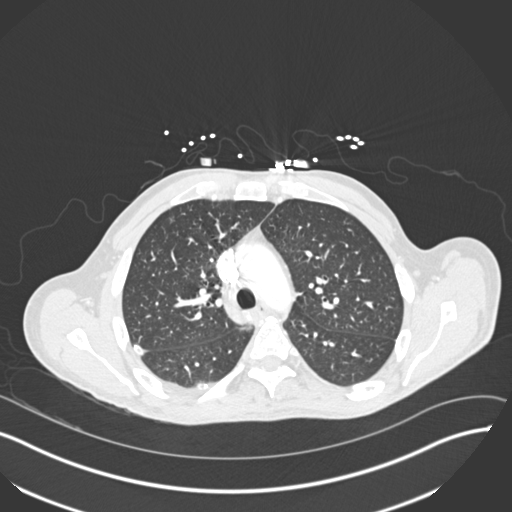
[im 199/274  mediastinal]
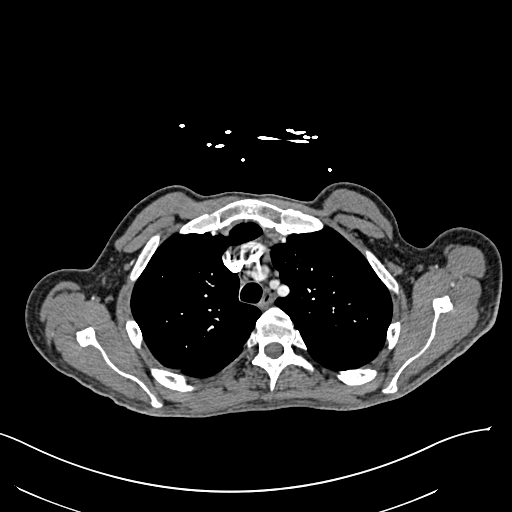
[im 224/274  lung]
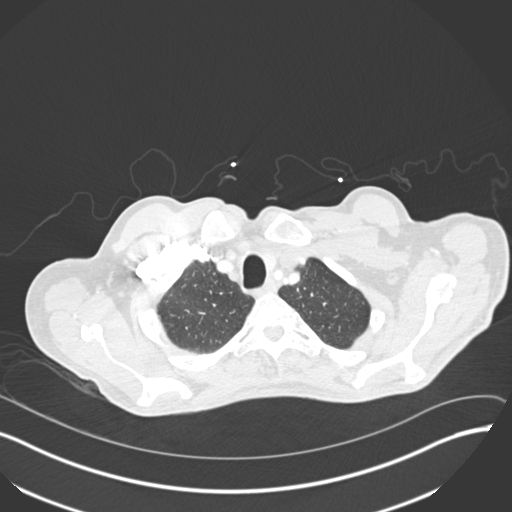
[im 249/274  mediastinal]
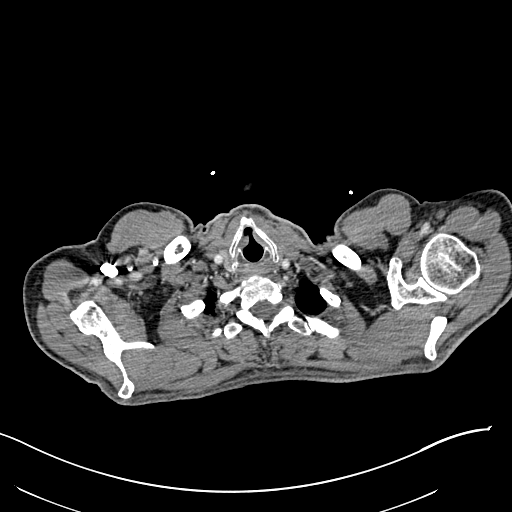

[10 of 38 positions shown; findings below may reference images not displayed]

FINDINGS: THORACIC INLET/BODY WALL:

No acute abnormality.

MEDIASTINUM:

Normal heart size.  No pericardial effusion.

Limited opacification of the pulmonary arterial tree requiring
repeat exam. Second bolus is also suboptimal, but improved. There is
no indication of pulmonary embolism, with adequate visualization at
least to the segmental level.

No acute aortic finding.  B

Orderline lower esophageal thickening, improved from previous. No
discrete periesophageal varix noted.

LUNG WINDOWS:

Tunneled pleural catheter on the right with stable small residual
pleural fluid. TThe catheter is in unremarkable position. here is a
new even smaller left pleural effusion. Mild dependent atelectasis.
No consolidation, edema, or pneumothorax. No central airway lesion
or obstruction. No visible alveolar hemorrhage.

UPPER ABDOMEN:

Cirrhosis with splenomegaly and ascites. No change in the
epigastrium since prior.

OSSEOUS:

No acute fracture.  No suspicious lytic or blastic lesions.

Review of the MIP images confirms the above findings.
IMPRESSION: 1. No evidence of pulmonary embolism. No specific explanation for
hemoptysis.
2. Small pleural effusions, stable on the right and new on the left
since 08/07/2015. Right tunneled pleural catheter in good position.
3. Cirrhosis with ascites.

## 2016-10-26 IMAGING — CR DG CHEST 2V
2 series · 2 of 2 positions shown · non-contrast
Comparison: CT 08/18/2015

CLINICAL DATA: Still cough, congestion, sob this a.m,

EXAM:
CHEST  2 VIEW

[w chest pa]
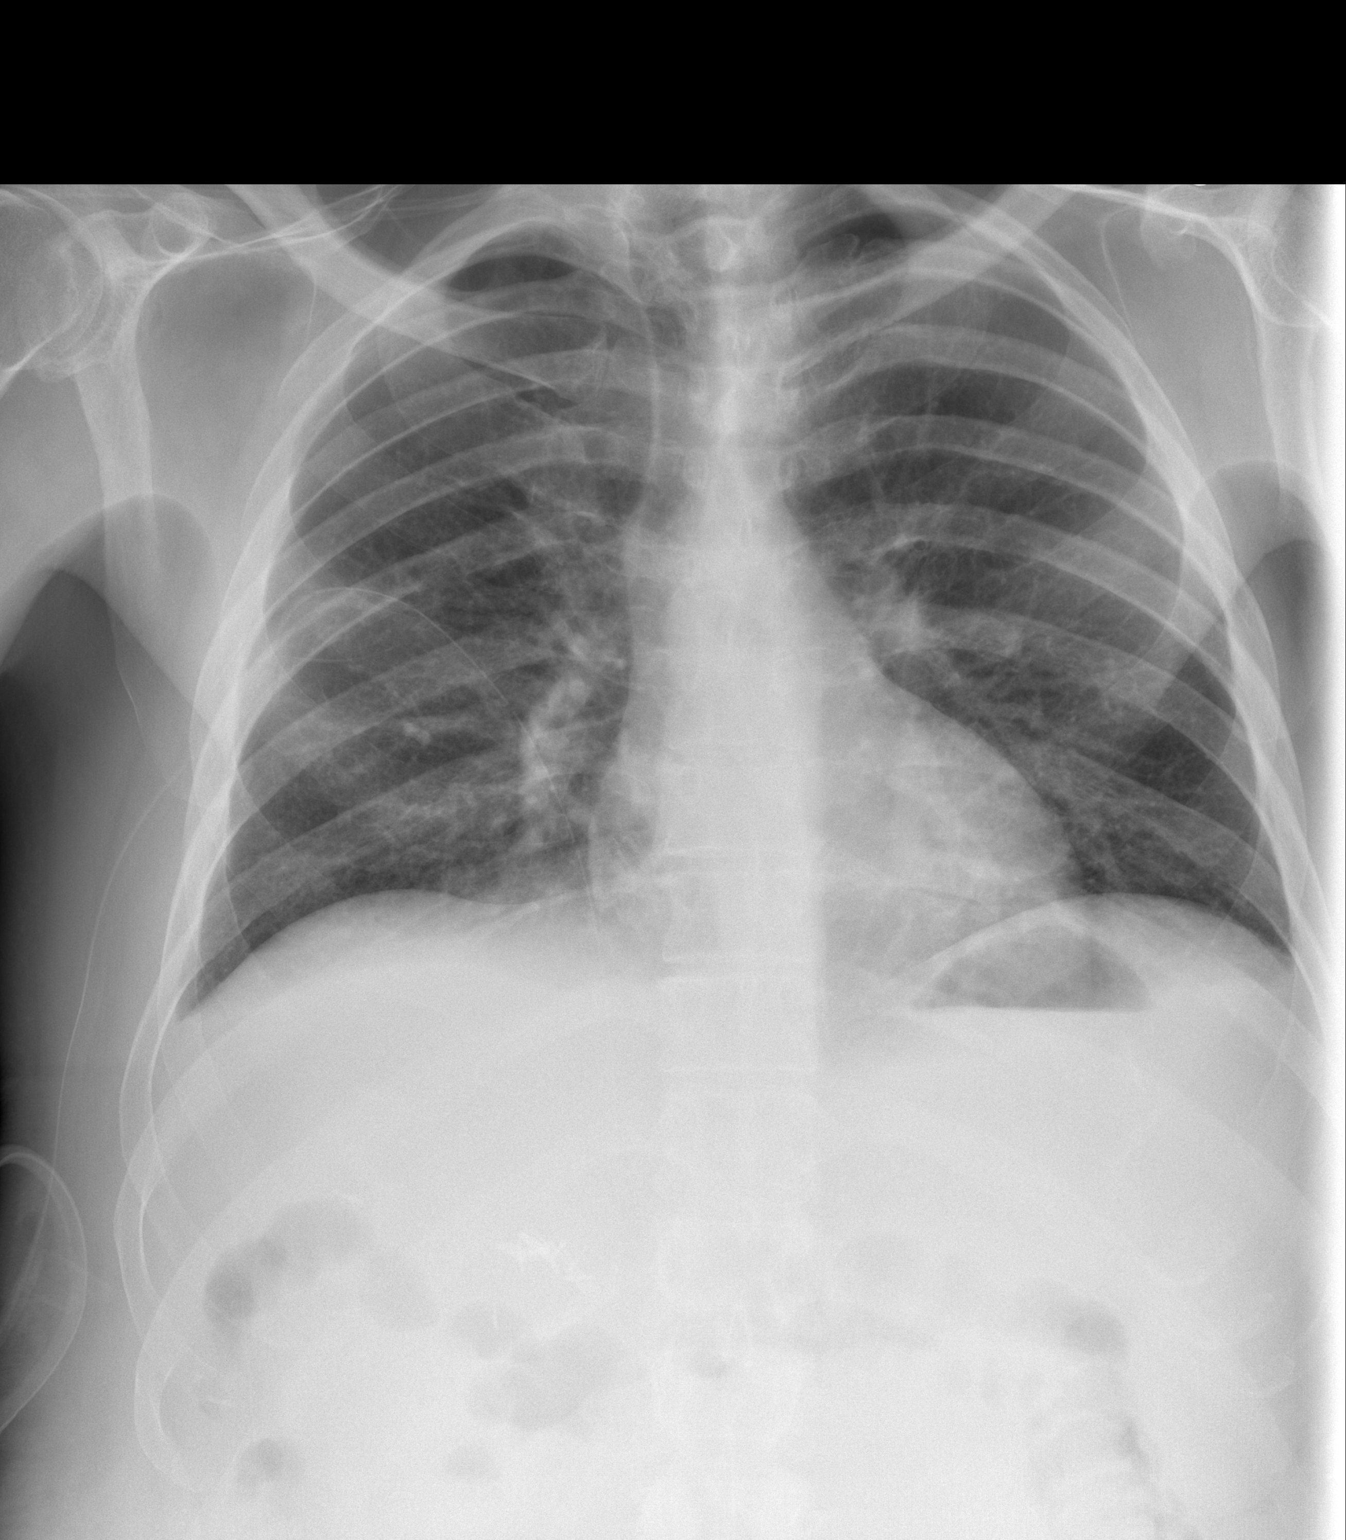

[w chest lat]
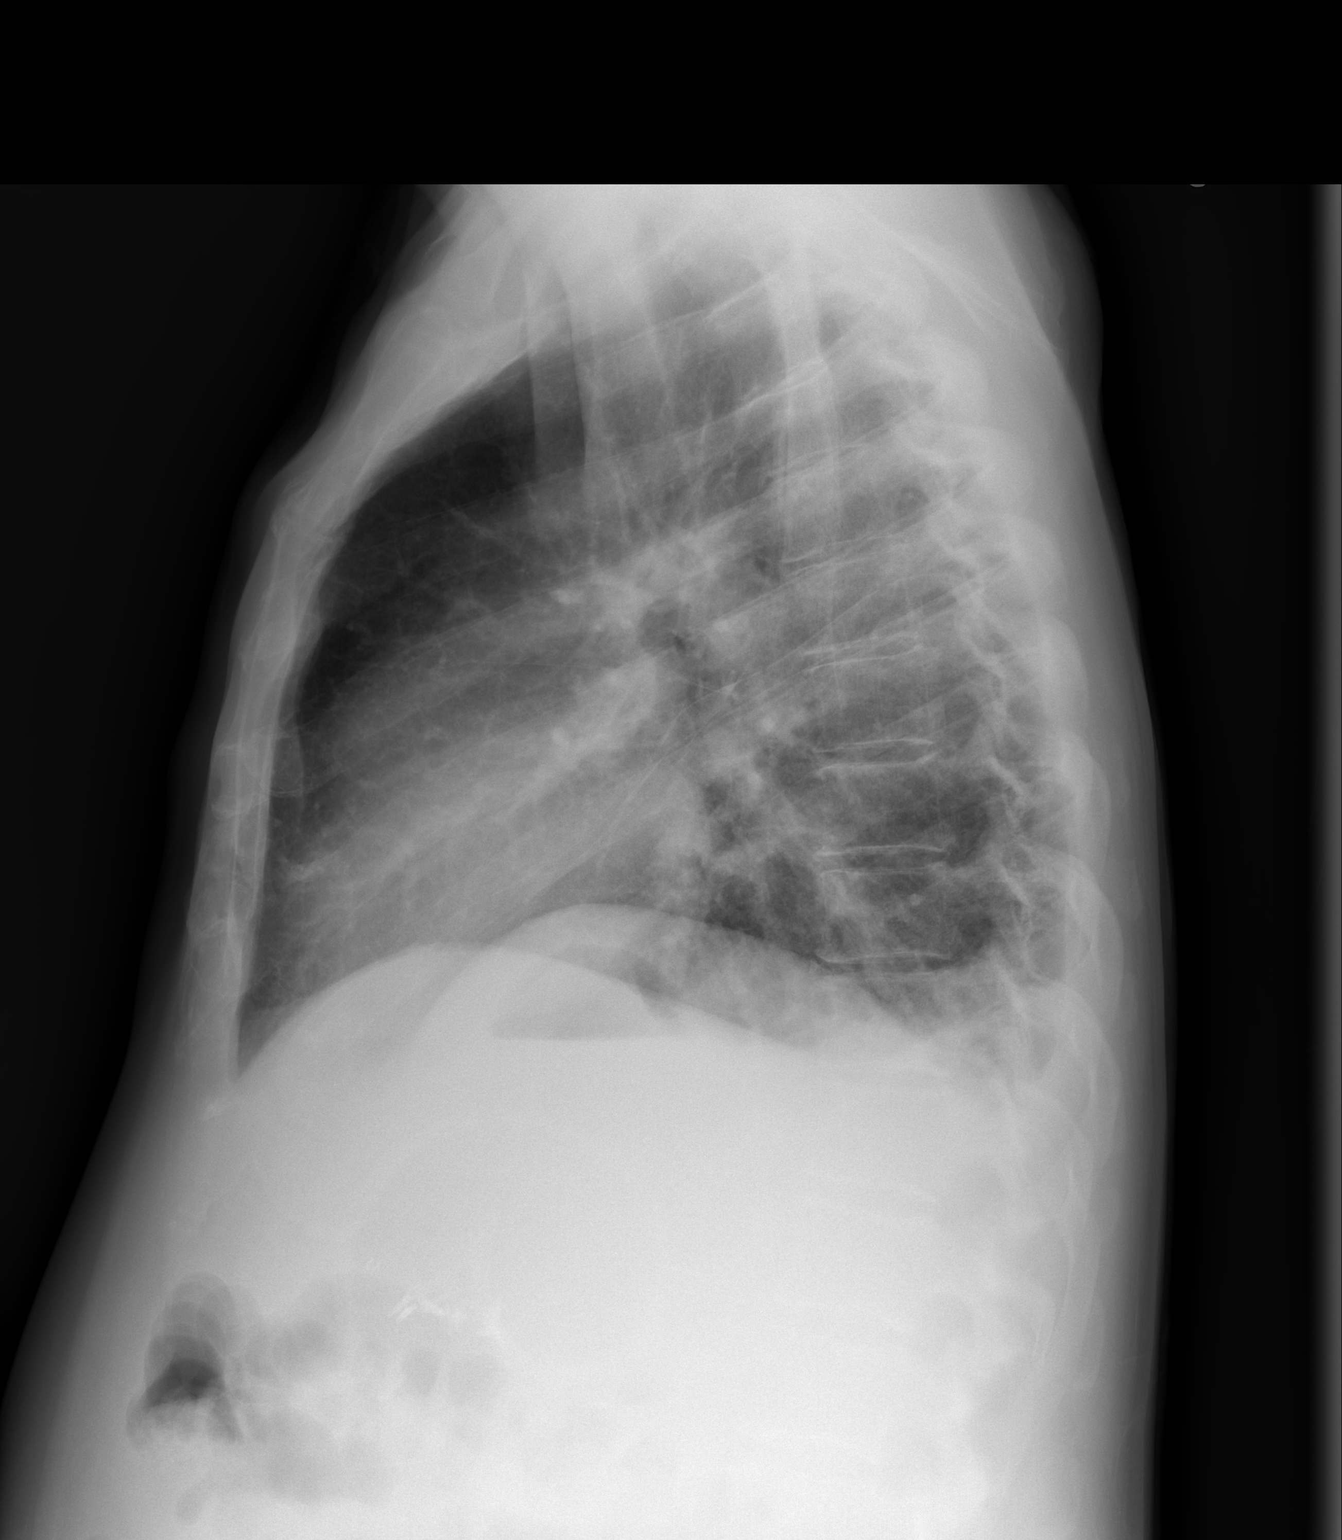

[2 of 2 positions shown; findings below may reference images not displayed]

FINDINGS: Right tunneled pleural catheter stable in position. No pneumothorax.
Small bilateral pleural effusions. Associated subsegmental
atelectasis or infiltrate posteriorly in both lower lobes as before.
No pneumothorax.

Heart size normal.

The visualized skeletal structures are unremarkable. Cholecystectomy
clips.
IMPRESSION: 1. Stable small pleural effusions, with adjacent atelectasis/
infiltrate in the posterior lower lobes.
2. Stable right tunneled chest tube without pneumothorax.
